# Patient Record
Sex: Female | Born: 1935 | Race: Black or African American | Hispanic: No | Smoking: Never smoker
Health system: Southern US, Community
[De-identification: ages and names within clinical notes are randomized; demographics above are authoritative.]

## PROBLEM LIST (undated history)

## (undated) DIAGNOSIS — H409 Unspecified glaucoma: Secondary | ICD-10-CM

## (undated) DIAGNOSIS — I639 Cerebral infarction, unspecified: Secondary | ICD-10-CM

## (undated) DIAGNOSIS — K219 Gastro-esophageal reflux disease without esophagitis: Secondary | ICD-10-CM

## (undated) DIAGNOSIS — Z86718 Personal history of other venous thrombosis and embolism: Secondary | ICD-10-CM

## (undated) DIAGNOSIS — E785 Hyperlipidemia, unspecified: Secondary | ICD-10-CM

## (undated) DIAGNOSIS — N189 Chronic kidney disease, unspecified: Secondary | ICD-10-CM

## (undated) DIAGNOSIS — G8191 Hemiplegia, unspecified affecting right dominant side: Secondary | ICD-10-CM

## (undated) DIAGNOSIS — K7689 Other specified diseases of liver: Secondary | ICD-10-CM

## (undated) DIAGNOSIS — E559 Vitamin D deficiency, unspecified: Secondary | ICD-10-CM

## (undated) DIAGNOSIS — I10 Essential (primary) hypertension: Secondary | ICD-10-CM

## (undated) DIAGNOSIS — K449 Diaphragmatic hernia without obstruction or gangrene: Secondary | ICD-10-CM

## (undated) DIAGNOSIS — I5032 Chronic diastolic (congestive) heart failure: Secondary | ICD-10-CM

## (undated) DIAGNOSIS — M1990 Unspecified osteoarthritis, unspecified site: Secondary | ICD-10-CM

## (undated) HISTORY — DX: Unspecified osteoarthritis, unspecified site: M19.90

## (undated) HISTORY — DX: Cerebral infarction, unspecified: I63.9

## (undated) HISTORY — DX: Chronic kidney disease, unspecified: N18.9

## (undated) HISTORY — DX: Hemiplegia, unspecified affecting right dominant side: G81.91

## (undated) HISTORY — DX: Other specified diseases of liver: K76.89

## (undated) HISTORY — DX: Vitamin D deficiency, unspecified: E55.9

## (undated) HISTORY — DX: Essential (primary) hypertension: I10

## (undated) HISTORY — DX: Personal history of other venous thrombosis and embolism: Z86.718

## (undated) HISTORY — DX: Chronic diastolic (congestive) heart failure: I50.32

## (undated) HISTORY — DX: Hyperlipidemia, unspecified: E78.5

## (undated) HISTORY — DX: Gastro-esophageal reflux disease without esophagitis: K21.9

## (undated) HISTORY — DX: Unspecified glaucoma: H40.9

## (undated) HISTORY — DX: Diaphragmatic hernia without obstruction or gangrene: K44.9

---

## 1973-11-11 HISTORY — PX: TOTAL ABDOMINAL HYSTERECTOMY: SHX209

## 1983-11-12 HISTORY — PX: CHOLECYSTECTOMY: SHX55

## 1999-02-27 ENCOUNTER — Other Ambulatory Visit: Admission: RE | Admit: 1999-02-27 | Discharge: 1999-02-27 | Payer: Self-pay | Admitting: Obstetrics and Gynecology

## 2001-05-25 ENCOUNTER — Encounter: Payer: Self-pay | Admitting: Interventional Cardiology

## 2001-05-25 ENCOUNTER — Ambulatory Visit (HOSPITAL_COMMUNITY): Admission: RE | Admit: 2001-05-25 | Discharge: 2001-05-25 | Payer: Self-pay | Admitting: Interventional Cardiology

## 2002-07-20 ENCOUNTER — Encounter: Admission: RE | Admit: 2002-07-20 | Discharge: 2002-07-20 | Payer: Self-pay | Admitting: Family Medicine

## 2002-07-20 ENCOUNTER — Encounter: Payer: Self-pay | Admitting: Family Medicine

## 2002-08-03 ENCOUNTER — Encounter: Payer: Self-pay | Admitting: Family Medicine

## 2002-08-03 ENCOUNTER — Encounter: Admission: RE | Admit: 2002-08-03 | Discharge: 2002-08-03 | Payer: Self-pay | Admitting: Family Medicine

## 2002-08-17 ENCOUNTER — Encounter: Payer: Self-pay | Admitting: Family Medicine

## 2002-08-17 ENCOUNTER — Encounter: Admission: RE | Admit: 2002-08-17 | Discharge: 2002-08-17 | Payer: Self-pay | Admitting: Family Medicine

## 2003-03-30 ENCOUNTER — Other Ambulatory Visit: Admission: RE | Admit: 2003-03-30 | Discharge: 2003-03-30 | Payer: Self-pay | Admitting: Obstetrics and Gynecology

## 2004-11-11 DIAGNOSIS — I639 Cerebral infarction, unspecified: Secondary | ICD-10-CM

## 2004-11-11 HISTORY — PX: LAPAROSCOPIC LYSIS OF ADHESIONS: SHX5905

## 2004-11-11 HISTORY — DX: Cerebral infarction, unspecified: I63.9

## 2004-11-13 ENCOUNTER — Ambulatory Visit: Payer: Self-pay | Admitting: Cardiology

## 2004-11-15 ENCOUNTER — Inpatient Hospital Stay (HOSPITAL_COMMUNITY)
Admission: RE | Admit: 2004-11-15 | Discharge: 2004-12-11 | Payer: Self-pay | Admitting: Physical Medicine & Rehabilitation

## 2004-11-15 ENCOUNTER — Ambulatory Visit: Payer: Self-pay | Admitting: Physical Medicine & Rehabilitation

## 2005-01-14 ENCOUNTER — Encounter
Admission: RE | Admit: 2005-01-14 | Discharge: 2005-04-14 | Payer: Self-pay | Admitting: Physical Medicine & Rehabilitation

## 2005-01-16 ENCOUNTER — Ambulatory Visit: Payer: Self-pay | Admitting: Physical Medicine & Rehabilitation

## 2005-03-13 ENCOUNTER — Ambulatory Visit: Payer: Self-pay | Admitting: Physical Medicine & Rehabilitation

## 2005-06-12 ENCOUNTER — Encounter
Admission: RE | Admit: 2005-06-12 | Discharge: 2005-09-10 | Payer: Self-pay | Admitting: Physical Medicine & Rehabilitation

## 2005-06-12 ENCOUNTER — Ambulatory Visit: Payer: Self-pay | Admitting: Physical Medicine & Rehabilitation

## 2005-12-09 ENCOUNTER — Ambulatory Visit: Payer: Self-pay | Admitting: Physical Medicine & Rehabilitation

## 2005-12-09 ENCOUNTER — Encounter
Admission: RE | Admit: 2005-12-09 | Discharge: 2006-03-09 | Payer: Self-pay | Admitting: Physical Medicine & Rehabilitation

## 2006-05-30 ENCOUNTER — Encounter
Admission: RE | Admit: 2006-05-30 | Discharge: 2006-08-28 | Payer: Self-pay | Admitting: Physical Medicine & Rehabilitation

## 2006-05-30 ENCOUNTER — Ambulatory Visit: Payer: Self-pay | Admitting: Physical Medicine & Rehabilitation

## 2007-02-18 ENCOUNTER — Ambulatory Visit: Payer: Self-pay | Admitting: Physical Medicine & Rehabilitation

## 2007-02-18 ENCOUNTER — Encounter
Admission: RE | Admit: 2007-02-18 | Discharge: 2007-05-19 | Payer: Self-pay | Admitting: Physical Medicine & Rehabilitation

## 2007-05-29 ENCOUNTER — Encounter
Admission: RE | Admit: 2007-05-29 | Discharge: 2007-06-01 | Payer: Self-pay | Admitting: Physical Medicine & Rehabilitation

## 2007-05-29 ENCOUNTER — Ambulatory Visit: Payer: Self-pay | Admitting: Physical Medicine & Rehabilitation

## 2007-06-16 ENCOUNTER — Encounter
Admission: RE | Admit: 2007-06-16 | Discharge: 2007-08-10 | Payer: Self-pay | Admitting: Physical Medicine & Rehabilitation

## 2007-11-17 ENCOUNTER — Encounter
Admission: RE | Admit: 2007-11-17 | Discharge: 2008-01-25 | Payer: Self-pay | Admitting: Physical Medicine & Rehabilitation

## 2007-11-17 ENCOUNTER — Ambulatory Visit: Payer: Self-pay | Admitting: Physical Medicine & Rehabilitation

## 2009-11-11 HISTORY — PX: CATARACT EXTRACTION: SUR2

## 2011-03-26 NOTE — Assessment & Plan Note (Signed)
PRIMARY CARE PHYSICIAN:  Doreen Beam, M.D.   Jaclyn Love is back regarding her left brain stroke, hemiparesis and apraxia.  She has actually had a quiet 6 months.  She has not had any new  problems.  She states that her blood pressure is under fair control.  She has been doing some walking and moving around the house.  She  received her new double-up  right AFO over the summer and seems to like that a bit better, although  it is larger.  She denies pain today.   REVIEW OF SYSTEMS:  Pertinent positives  listed above  and full review  is in written health and history section.   SOCIAL HISTORY:  The patient is widowed, living alone with her daughter.   PHYSICAL EXAMINATION:  VITAL SIGNS:  Blood pressure is 162/85, pulse 68,  respiratory rate 18.  She is satting 99% on room air.  GENERAL:  The patient is pleasant, alert and oriented x3.  She needed  assistance to move from a sit to stand position, but walking was  improved with better clearance and less hyperextension of the right  knee.  She was generally stable, except occasionally with changes in  direction.  She took extra time to walk, but her form was good.  Ankle  dorsiflexion is still 0 to 5.  She had 1 to 1+ plantar flexion at the  ankle.  Knee extension was 3+ to 4/5.  She is 3+ to 4 at the hip, as  well.  Upper extremity strength is 4 to 4+/5, except for the shoulder  which was 3-.  She has some capsulitis and decreased strains at the  right shoulder today.  There is a quarter-inch subluxation of the  humeral head noted inferiorly.  HEART:  Regular rate.  CHEST:  Clear.  ABDOMEN:  Soft, nontender.  MENTAL STATUS:  Affect is generally appropriate, although she still  remains a bit flat.   ASSESSMENT:  1. 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  2. Hypertension.  3. History of small bowel obstruction.  4. History of right third toe wound.   PLAN:  1. She was urged to continue to exercise and range of motion in  the      home, particularly for the right shoulder.  She is moving her right      upper extremity with less apraxia and is much more spontaneous      overall.  2. Hypertension through PCP.  3. Kidneys and anemia, follow up through oncology and nephrology.  4. I will see the patient back as needed in the future.      Ranelle Oyster, M.D.  Electronically Signed     ZTS/MedQ  D:  11/18/2007 10:40:28  T:  11/18/2007 11:12:54  Job #:  [id number]   cc:   Doreen Beam, MD  Fax: [phone]

## 2011-03-26 NOTE — Assessment & Plan Note (Signed)
Jaclyn Love is back regarding her left brain stroke with hemiparesis and  apraxia. Unfortunately, Jaclyn Love husband passed away about a month ago  from an unexpected heart attack. Family and she have been working  through their grief. She seems to feel she has been doing a bit better  over the last week or so. The patient denies pain today. Mood has been  depressed. Sleep has been good. She received her new double upright AFO  from [facility] and is doing well with this. The patient's skin has been  an issue, however, on the third toe of the right foot, and she has been  wearing padding in their to prevent. She notes that the issue has been  prominent she has had the new shoe.   On review of systems, full review is in the written health and history  section. Pertinent positives are listed above.   SOCIAL HISTORY:  The patient is widowed and living alone currently. The  family is supportive.   PHYSICAL EXAMINATION:  Blood pressure is 148/58, pulse 77, respiratory  rate 18. She is saturating 100% on room air. The patient is a bit flat  but pleasant. I helped her transfer from a sit to stand position from a  low-sitting chair. She needed min assist for this. Once she was walking,  she walked independently with her quad cane. However, she did have some  problems with right toe clearance and hyperextension at the right knee.  It was clearly stable, however. She needed extra time for change in  direction. I examined the right toe, and she had mild breakdown on the  medial portion of the third toe which seemed to be healing. She had  cotton swab in there for padding which seemed to be doing a fair job of  protecting the skin. Ankle dorsi flexion is 0. Plantar flexion remains  1/5. She had 2+ reflexes on the right. Minimal clonus was seen on the  right ankle. The patient had 3+ to 4/5 strength still at the right knee.  Right upper extremity strength was 4 to 4+/5 with apraxia still.  HEART:   Regular.  CHEST:  Clear.  ABDOMEN:  Soft, nontender.   ASSESSMENT:  1. 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  2. Hypertension.  3. History of small-bowel obstruction.  4. History of right third toe breakdown.   PLAN:  1. The patient will follow up with orthotist for stretching of toe box      in the right shoe. She is also getting another pair of shoes fitted      for the metal uprights. Continue with padding for now in between      the toes. She may be able to obtain an orthotic to protect the toe      itself.  2. Will send patient for constraint-induced therapies of the right      upper extremity to see if she would be a candidate. She may not      meet criteria due to balance issues. Maybe she can quality for      modified problem.  3. Hypertension through PCP.  4. I will see the patient back in six months' time.      Ranelle Oyster, M.D.  Electronically Signed     ZTS/MedQ  D:  06/01/2007 10:46:40  T:  06/01/2007 11:53:17  Job #:  [id number]

## 2011-03-29 NOTE — Discharge Summary (Signed)
Jaclyn Love, Jaclyn Love                 ACCOUNT NO.:  [id number]   MEDICAL RECORD NO.:  [id number]          PATIENT TYPE:  IPS   LOCATION:  4003                         FACILITY:  [facility]   PHYSICIAN:  Ranelle Oyster, M.D.DATE OF BIRTH:  Jul 30, 1931   DATE OF ADMISSION:  11/15/2004  DATE OF DISCHARGE:  12/11/2004                                 DISCHARGE SUMMARY   DISCHARGE DIAGNOSES:  1.  Left cerebrovascular accident with right hemiplegia.  2.  Subcutaneous Lovenox for deep vein thrombosis prophylaxis.  3.  Uncontrolled hypertension.  4.  Hyperlipidemia.  5.  Depression.  6.  Gastroesophageal reflux disease.  7.  Chronic renal insufficiency.   HISTORY OF PRESENT ILLNESS:  This is a 77 year old right-handed black female  with history of hypertension who was admitted to [facility] January 2  with right-sided weakness.  MRI showed a left deep parietal area in the  thalamus and periventricular region.  There was also subacute infarction in  the same area.  Carotid duplex negative.   HOSPITAL COURSE:  Placed on aspirin, Plavix therapy.  Speech therapy  followup, maintained on a regular diet.  Blood pressure continued to have  variables and Clonidine was added to her regimen.  Denied any bowel or  bladder disturbances.  She was admitted for comprehensive rehab program.   PAST MEDICAL HISTORY:  Hypertension, depression, chronic renal insufficiency  with creatinine 1.5, cholecystectomy, appendectomy, hysterectomy, hiatal  hernia.  Denies alcohol or tobacco.   ALLERGIES:  None.   SOCIAL HISTORY:  Lives with husband in [place], one-level home with no steps to  entry.  Husband can assist on discharge.  Local family works. She denies  tobacco or alcohol.   MEDICATIONS PRIOR TO ADMISSION:  1.  Premarin 0.9 mg daily.  2.  Eye drops.  3.  Trandate 400 mg twice daily.  4.  Zantac twice daily.  5.  Wellbutrin 150 mg twice daily.  6.  Norvasc 10 mg daily.  7.  Clonidine 0.1 mg  daily.  8.  Lasix 40 mg daily.   HOSPITAL COURSE:  The patient with progressive gains while on rehab services  with therapies initiated on a b.i.d. basis.  The following issues are  followed during the patient's rehab course.  Pertaining to Jaclyn Love left  cerebrovascular accident with right-sided weakness she was maintained on  aspirin, Plavix therapy for cerebrovascular accident prophylaxis.  She was  minimum to moderate assist for activities of daily living of bathing and  dressing, moderate to max assist for ambulation 25 feet, supervision for  propelling her wheelchair followed by speech therapy felt to be 80%  comprehension.  She exhibited no unsafe behaviors.  She was maintained on a  regular diet tolerating well.  Continued with subcutaneous Lovenox for deep  vein thrombosis prophylaxis.  Her calves remained cool without any swelling,  erythema, nontender.  During her rehab course, she had extended elevated  blood pressure readings of systolic 181, 195, 208.  Initially upon her  admission to rehab services, she was only on her Clonidine 0.1 mg three  times daily.  This was increased to 0.2 on January 6.  Norvasc added at 10  mg daily on January 7.  Clonidine established with parameters.  Lasix added  at 40 mg daily on January 8.  She continued to have elevated pressures with  Cozaar added 50 mg daily on January 9.  Consultations were obtained to both  cardiology and nephrology services for uncontrolled hypertension.  Her Lasix  was discontinued on January 12.  Addition of Aldactone 25 mg daily and  Cozaar increased to 100 mg daily.  Renal ultrasound as well as angiograms  were completed to rule out renal artery stenosis, both being negative.  As  her regimen continued to be changed as of January 30, Micardis 80 mg daily  was added.  Norvasc changed to 10 mg at bedtime.  She continued on her Coreg  25 mg twice daily.  Clonidine was now at 0.3 mg twice daily.  Lasix 40 mg  twice  daily with monitoring of renal function with baseline creatinine of  1.5.  This had increased to a max of 2.5 and monitored with last being 127  at 2.0 and stable.  Minoxidil was added at 5 mg twice daily on December 07, 2004.  Her blood pressures did show a steady improvement; systolics 118,  136; diastolics 62, 78.  It was felt if she could be held at systolic 160,  diastolic of 80 this would be with good control.  Family teaching was  ongoing during her rehab stay.  She was fitted with a right AFO brace during  her rehab course.  Day passes went well.  Plan is to be discharged to home  on December 11, 2004 with home therapies as advised.  As of January 19,  hemoglobin 10.9, hematocrit 33.9, platelet 195.  As of January 27, sodium  138, potassium 3.6, BUN 32, creatinine 2.0, and stable.   DISCHARGE MEDICATIONS:  1.  Micardis 80 mg p.o. q.h.s.  2.  Norvasc 10 mg p.o. q.h.s.  3.  Multivitamin one p.o. daily.  4.  Aspirin 325 mg p.o. daily.  5.  Plavix 75 mg p.o. daily.  6.  Premarin 0.9 mg daily was on hold.  7.  Zantac twice daily.  8.  Wellbutrin 150 mg p.o. b.i.d.  9.  Zocor 40 mg p.o. daily.  10. Xalatan 0.005% one drop both eyes twice daily.  11. Trusopt 2% one drop both eyes twice daily.  12. Timoptic one drop 0.5% both eyes twice daily.  13. Coreg 25 mg p.o. b.i.d.  14. Clonidine 0.3 mg p.o. b.i.d.  15. Lasix 40 mg p.o. b.i.d.  16. Minoxidil 5 mg p.o. b.i.d.  17. Potassium chloride 20 mEq twice daily.  18. Tylenol as needed.   Activity as tolerated with AFO brace to right lower extremity.   DIET:  Regular.   SPECIAL INSTRUCTIONS:  Home health, physical and occupational therapy.  The  patient should follow up with Dr. Faith Rogue at the outpatient rehab  service office as advised.  Dr. Eliott Nine [phone] nephrology services; for any  needed assistance concerning blood pressure; Dr. Verdis Prime, cardiology services.  Dr. Sherril Croon, [place], [place] [phone]; medical  management.      DA/MEDQ  D:  12/10/2004  T:  12/10/2004  Job:  [id number]   cc:   Duke Salvia. Eliott Nine, M.D.  [address]  [place]  [place] [address]  Fax: [phone]   Lyn Records III, M.D.  [address]  [place]  [place] [address]  Fax: [phone]   Doreen Beam  [address]  [place]  [place] [address]  Fax: [phone]

## 2011-03-29 NOTE — Consult Note (Signed)
NAMEDOVEY, FATZINGER                 ACCOUNT NO.:  [id number]   MEDICAL RECORD NO.:  [id number]          PATIENT TYPE:  IPS   LOCATION:  4003                         FACILITY:  [facility]   PHYSICIAN:  Aram Beecham B. Eliott Nine, M.D.DATE OF BIRTH:  04-24-1931   DATE OF CONSULTATION:  12/04/2004  DATE OF DISCHARGE:                                   CONSULTATION   We are asked to see this patient because of difficult to control  hypertension.  She is a 77 year old black female patient who has been on the  rehab service since early January secondary to a left brain right body  stroke. She has had long standing hypertension difficult to control as an  outpatient with average blood pressures in Dr. Michaelle Copas office that range  between 160 and 170/80 previously on a combination of Norvasc, Lasix,  Trandate and reserpine to achieve these levels.  Since admission on the  rehab service, she has been on a variety of medication combinations most  recently Lasix 40 mg a day, Norvasc 10 mg a day, minoxidil 5 mg a day,  clonidine 0.3 mg b.i.d. and Coreg 25 mg b.i.d. with a blood pressure earlier  today measured at 195/99.  The range of blood pressures over the last four  days has been anywhere from 132/79 to 125/99.   She has had a negative workup for causes of secondary hypertension including  a CO2 gadolinium angiogram on November 29, 2004 which was negative for renal  artery stenosis or for branch vessel disease. VMA 5HIAA and catecholamine  excretion in urine have all been normal.   Her serum creatinine on admission was 1.6.  Between the 12th and the 20th of  this month, it ranged between 1.5 and 1.8 and for unclear reasons bumped to  2.5 on the 21st and 22nd of January.  Cozaar was held at that time.  It has  returned to 2.1 today.   Renal ultrasound showed normal size kidneys with no evidence for  hydronephrosis.   Urinalysis has been entirely negative including for protein and cells.   PAST MEDICAL  HISTORY:  1.  Hypertension greater than 20 years duration according to the patient.  2.  History of left brain right body stroke.  3.  Dyslipidemia.  4.  History of depression.  5.  Angioedema secondary to Vasotec.  6.  Gout.   INPATIENT MEDICATIONS:  1.  Aspirin.  2.  Multivitamins.  3.  Plavix 75 mg a day.  4.  Coreg 25 mg b.i.d.  5.  Premarin 0.625 mg a day.  6.  Pepcid 20 mg b.i.d.  7.  Wellbutrin 150 mg b.i.d.  8.  Zocor 40 mg q.d.  9.  Xalatan eye drops b.i.d.  10. Trusopt drops b.i.d.  11. Timoptic eye drops.  12. Norvasc 10 mg a day.  13. K-Dur 20 mEq a day.  14. Cozaar has been on hold since the 21st of January.  15. Lovenox every 24 hours.  16. Lasix 40 mg a day.  17. Docusate twice a day.  18. Catapres 0.3 mg b.i.d.  19. Minoxidil 5 mg q.d.  20. Numerous p.r.n. medicines.   FAMILY HISTORY:  Positive for hypertension.   SOCIAL HISTORY:  The patient lives in [place] with her husband. She does not  use alcohol or tobacco.   REVIEW OF SYMPTOMS:  Positive for diffuse right sided weakness/paresis.  She  denies headache, chest pain, shortness of breath, palpitations, swelling,  nausea, vomiting, abdominal pain or diarrhea.  She is having pain in her  left great toe secondary to gout.   PHYSICAL EXAMINATION:  GENERAL:  She is a very pleasant black female in no  acute distress.  VITAL SIGNS:  Sitting blood pressure in the right arm with a regular cuff  was 140/82.  NECK:  She has no JVD, no carotid bruits.  LUNGS:  Clear.  HEART:  There is a positive S4 but normal S1, S2.  ABDOMEN:  Soft, bowel sounds are normally active. There is no abdominal  tenderness and there are no bruits.  EXTREMITIES:  She has no edema of the lower extremities. There are no  stigmata of atheroemboli.  She has a right hemiparesis and her left great  toe is erythematous and tender.   LABORATORY DATA:  Today, sodium 134, potassium 3.9, chloride 108, CO2 20,  BUN 29, creatinine 2.1, glucose  103.  VMA metanephrine's and 5HIAA and 24  hour urine collections are all within normal limits.  Urinalysis is  negative for protein or cells. CO2 angiogram negative for renal artery  stenosis or branch vessel disease.  Renal ultrasound 11 plus cm kidneys  bilaterally without hydronephrosis.   IMPRESSION:  A 77 year old black female with longstanding severe  hypertension who is on multiple medications who has exhibited more  difficulty in control and more lability during this hospital admission.  We  need to measure orthostatic's to get a better idea if this is truly labile  or if she just has some orthostasis.  Her present sitting blood pressure is  actually reasonable compared to other readings.  Since her Coreg and  Catapres were just changed yesterday will not make changes yet but will ask  for orthostatic determinations.   I am not clear on the etiology for the bump in her creatinine.  It is very  difficult for me to attribute this to impaired autoregulation associated  with controlling her hypertension since her blood pressure has not been  changed dramatically. She has no evidence for hydronephrosis or obstruction  and has a totally negative urinalysis.  Will simply watch for now although I  would not be surprised to see a bump if we do achieve better blood pressure  control.   PLAN:  Will not make any changes in medications yet and will check  orthostatic's to evaluate the effects of the most recent medication changes.  Would keep Lasix at 40 mg a day for now as she has no edema. Further  recommendations pending serial blood pressures.      CBD/MEDQ  D:  12/04/2004  T:  12/04/2004  Job:  [id number]

## 2011-06-19 ENCOUNTER — Encounter (HOSPITAL_COMMUNITY): Payer: Medicare Other | Attending: Nephrology

## 2011-06-19 DIAGNOSIS — D509 Iron deficiency anemia, unspecified: Secondary | ICD-10-CM | POA: Insufficient documentation

## 2015-11-12 DIAGNOSIS — K7689 Other specified diseases of liver: Secondary | ICD-10-CM

## 2015-11-12 HISTORY — DX: Other specified diseases of liver: K76.89

## 2015-12-05 DIAGNOSIS — I82409 Acute embolism and thrombosis of unspecified deep veins of unspecified lower extremity: Secondary | ICD-10-CM | POA: Diagnosis not present

## 2015-12-25 DIAGNOSIS — N390 Urinary tract infection, site not specified: Secondary | ICD-10-CM | POA: Diagnosis not present

## 2016-01-04 DIAGNOSIS — I82409 Acute embolism and thrombosis of unspecified deep veins of unspecified lower extremity: Secondary | ICD-10-CM | POA: Diagnosis not present

## 2016-01-22 DIAGNOSIS — I82409 Acute embolism and thrombosis of unspecified deep veins of unspecified lower extremity: Secondary | ICD-10-CM | POA: Diagnosis not present

## 2016-01-22 DIAGNOSIS — G4700 Insomnia, unspecified: Secondary | ICD-10-CM | POA: Diagnosis not present

## 2016-02-06 DIAGNOSIS — H4720 Unspecified optic atrophy: Secondary | ICD-10-CM | POA: Diagnosis not present

## 2016-02-06 DIAGNOSIS — H401123 Primary open-angle glaucoma, left eye, severe stage: Secondary | ICD-10-CM | POA: Diagnosis not present

## 2016-02-06 DIAGNOSIS — H401112 Primary open-angle glaucoma, right eye, moderate stage: Secondary | ICD-10-CM | POA: Diagnosis not present

## 2016-02-07 DIAGNOSIS — I639 Cerebral infarction, unspecified: Secondary | ICD-10-CM | POA: Diagnosis not present

## 2016-02-07 DIAGNOSIS — E7800 Pure hypercholesterolemia, unspecified: Secondary | ICD-10-CM | POA: Diagnosis not present

## 2016-02-07 DIAGNOSIS — I10 Essential (primary) hypertension: Secondary | ICD-10-CM | POA: Diagnosis not present

## 2016-02-09 DIAGNOSIS — Z6825 Body mass index (BMI) 25.0-25.9, adult: Secondary | ICD-10-CM | POA: Diagnosis not present

## 2016-02-09 DIAGNOSIS — Z0000 Encounter for general adult medical examination without abnormal findings: Secondary | ICD-10-CM | POA: Diagnosis not present

## 2016-02-09 DIAGNOSIS — E559 Vitamin D deficiency, unspecified: Secondary | ICD-10-CM | POA: Diagnosis not present

## 2016-02-09 DIAGNOSIS — Z1211 Encounter for screening for malignant neoplasm of colon: Secondary | ICD-10-CM | POA: Diagnosis not present

## 2016-02-09 DIAGNOSIS — Z299 Encounter for prophylactic measures, unspecified: Secondary | ICD-10-CM | POA: Diagnosis not present

## 2016-02-09 DIAGNOSIS — Z7189 Other specified counseling: Secondary | ICD-10-CM | POA: Diagnosis not present

## 2016-02-09 DIAGNOSIS — Z79899 Other long term (current) drug therapy: Secondary | ICD-10-CM | POA: Diagnosis not present

## 2016-02-09 DIAGNOSIS — M2550 Pain in unspecified joint: Secondary | ICD-10-CM | POA: Diagnosis not present

## 2016-02-09 DIAGNOSIS — E039 Hypothyroidism, unspecified: Secondary | ICD-10-CM | POA: Diagnosis not present

## 2016-02-09 DIAGNOSIS — Z1389 Encounter for screening for other disorder: Secondary | ICD-10-CM | POA: Diagnosis not present

## 2016-02-19 DIAGNOSIS — I82409 Acute embolism and thrombosis of unspecified deep veins of unspecified lower extremity: Secondary | ICD-10-CM | POA: Diagnosis not present

## 2016-02-21 DIAGNOSIS — Z862 Personal history of diseases of the blood and blood-forming organs and certain disorders involving the immune mechanism: Secondary | ICD-10-CM | POA: Diagnosis not present

## 2016-02-21 DIAGNOSIS — N2581 Secondary hyperparathyroidism of renal origin: Secondary | ICD-10-CM | POA: Diagnosis not present

## 2016-02-21 DIAGNOSIS — N183 Chronic kidney disease, stage 3 (moderate): Secondary | ICD-10-CM | POA: Diagnosis not present

## 2016-02-21 DIAGNOSIS — Z8639 Personal history of other endocrine, nutritional and metabolic disease: Secondary | ICD-10-CM | POA: Diagnosis not present

## 2016-02-21 DIAGNOSIS — I10 Essential (primary) hypertension: Secondary | ICD-10-CM | POA: Diagnosis not present

## 2016-02-28 DIAGNOSIS — M79672 Pain in left foot: Secondary | ICD-10-CM | POA: Diagnosis not present

## 2016-02-28 DIAGNOSIS — I739 Peripheral vascular disease, unspecified: Secondary | ICD-10-CM | POA: Diagnosis not present

## 2016-02-28 DIAGNOSIS — B351 Tinea unguium: Secondary | ICD-10-CM | POA: Diagnosis not present

## 2016-02-28 DIAGNOSIS — M79671 Pain in right foot: Secondary | ICD-10-CM | POA: Diagnosis not present

## 2016-03-15 DIAGNOSIS — I639 Cerebral infarction, unspecified: Secondary | ICD-10-CM | POA: Diagnosis not present

## 2016-03-15 DIAGNOSIS — I10 Essential (primary) hypertension: Secondary | ICD-10-CM | POA: Diagnosis not present

## 2016-03-15 DIAGNOSIS — E7800 Pure hypercholesterolemia, unspecified: Secondary | ICD-10-CM | POA: Diagnosis not present

## 2016-03-18 DIAGNOSIS — I82409 Acute embolism and thrombosis of unspecified deep veins of unspecified lower extremity: Secondary | ICD-10-CM | POA: Diagnosis not present

## 2016-03-18 DIAGNOSIS — M1990 Unspecified osteoarthritis, unspecified site: Secondary | ICD-10-CM | POA: Diagnosis not present

## 2016-04-09 DIAGNOSIS — M542 Cervicalgia: Secondary | ICD-10-CM | POA: Diagnosis not present

## 2016-04-09 DIAGNOSIS — I10 Essential (primary) hypertension: Secondary | ICD-10-CM | POA: Diagnosis not present

## 2016-04-09 DIAGNOSIS — Z789 Other specified health status: Secondary | ICD-10-CM | POA: Diagnosis not present

## 2016-04-09 DIAGNOSIS — I82409 Acute embolism and thrombosis of unspecified deep veins of unspecified lower extremity: Secondary | ICD-10-CM | POA: Diagnosis not present

## 2016-04-09 DIAGNOSIS — M1710 Unilateral primary osteoarthritis, unspecified knee: Secondary | ICD-10-CM | POA: Diagnosis not present

## 2016-04-15 DIAGNOSIS — B356 Tinea cruris: Secondary | ICD-10-CM | POA: Diagnosis not present

## 2016-04-15 DIAGNOSIS — I82409 Acute embolism and thrombosis of unspecified deep veins of unspecified lower extremity: Secondary | ICD-10-CM | POA: Diagnosis not present

## 2016-04-18 DIAGNOSIS — I639 Cerebral infarction, unspecified: Secondary | ICD-10-CM | POA: Diagnosis not present

## 2016-04-18 DIAGNOSIS — E7800 Pure hypercholesterolemia, unspecified: Secondary | ICD-10-CM | POA: Diagnosis not present

## 2016-04-18 DIAGNOSIS — I10 Essential (primary) hypertension: Secondary | ICD-10-CM | POA: Diagnosis not present

## 2016-05-21 DIAGNOSIS — I82409 Acute embolism and thrombosis of unspecified deep veins of unspecified lower extremity: Secondary | ICD-10-CM | POA: Diagnosis not present

## 2016-05-21 DIAGNOSIS — Z299 Encounter for prophylactic measures, unspecified: Secondary | ICD-10-CM | POA: Diagnosis not present

## 2016-05-27 DIAGNOSIS — E7800 Pure hypercholesterolemia, unspecified: Secondary | ICD-10-CM | POA: Diagnosis not present

## 2016-05-27 DIAGNOSIS — I10 Essential (primary) hypertension: Secondary | ICD-10-CM | POA: Diagnosis not present

## 2016-05-27 DIAGNOSIS — I639 Cerebral infarction, unspecified: Secondary | ICD-10-CM | POA: Diagnosis not present

## 2016-05-30 DIAGNOSIS — B351 Tinea unguium: Secondary | ICD-10-CM | POA: Diagnosis not present

## 2016-05-30 DIAGNOSIS — I739 Peripheral vascular disease, unspecified: Secondary | ICD-10-CM | POA: Diagnosis not present

## 2016-05-30 DIAGNOSIS — M79672 Pain in left foot: Secondary | ICD-10-CM | POA: Diagnosis not present

## 2016-05-30 DIAGNOSIS — M79671 Pain in right foot: Secondary | ICD-10-CM | POA: Diagnosis not present

## 2016-06-09 DIAGNOSIS — R42 Dizziness and giddiness: Secondary | ICD-10-CM | POA: Diagnosis not present

## 2016-06-09 DIAGNOSIS — Z888 Allergy status to other drugs, medicaments and biological substances status: Secondary | ICD-10-CM | POA: Diagnosis not present

## 2016-06-09 DIAGNOSIS — R531 Weakness: Secondary | ICD-10-CM | POA: Diagnosis not present

## 2016-06-09 DIAGNOSIS — I69351 Hemiplegia and hemiparesis following cerebral infarction affecting right dominant side: Secondary | ICD-10-CM | POA: Diagnosis not present

## 2016-06-09 DIAGNOSIS — G839 Paralytic syndrome, unspecified: Secondary | ICD-10-CM | POA: Diagnosis not present

## 2016-06-09 DIAGNOSIS — R51 Headache: Secondary | ICD-10-CM | POA: Diagnosis not present

## 2016-06-09 DIAGNOSIS — Z91013 Allergy to seafood: Secondary | ICD-10-CM | POA: Diagnosis not present

## 2016-06-09 DIAGNOSIS — Z9109 Other allergy status, other than to drugs and biological substances: Secondary | ICD-10-CM | POA: Diagnosis not present

## 2016-06-09 DIAGNOSIS — Z79899 Other long term (current) drug therapy: Secondary | ICD-10-CM | POA: Diagnosis not present

## 2016-06-11 DIAGNOSIS — I10 Essential (primary) hypertension: Secondary | ICD-10-CM | POA: Diagnosis not present

## 2016-06-11 DIAGNOSIS — Z09 Encounter for follow-up examination after completed treatment for conditions other than malignant neoplasm: Secondary | ICD-10-CM | POA: Diagnosis not present

## 2016-06-11 DIAGNOSIS — Z299 Encounter for prophylactic measures, unspecified: Secondary | ICD-10-CM | POA: Diagnosis not present

## 2016-06-11 DIAGNOSIS — I639 Cerebral infarction, unspecified: Secondary | ICD-10-CM | POA: Diagnosis not present

## 2016-06-18 DIAGNOSIS — E7800 Pure hypercholesterolemia, unspecified: Secondary | ICD-10-CM | POA: Diagnosis not present

## 2016-06-18 DIAGNOSIS — I639 Cerebral infarction, unspecified: Secondary | ICD-10-CM | POA: Diagnosis not present

## 2016-06-18 DIAGNOSIS — I10 Essential (primary) hypertension: Secondary | ICD-10-CM | POA: Diagnosis not present

## 2016-06-19 DIAGNOSIS — I82409 Acute embolism and thrombosis of unspecified deep veins of unspecified lower extremity: Secondary | ICD-10-CM | POA: Diagnosis not present

## 2016-06-19 DIAGNOSIS — I10 Essential (primary) hypertension: Secondary | ICD-10-CM | POA: Diagnosis not present

## 2016-06-26 DIAGNOSIS — I10 Essential (primary) hypertension: Secondary | ICD-10-CM | POA: Diagnosis not present

## 2016-06-26 DIAGNOSIS — R600 Localized edema: Secondary | ICD-10-CM | POA: Diagnosis not present

## 2016-07-07 DIAGNOSIS — Z8673 Personal history of transient ischemic attack (TIA), and cerebral infarction without residual deficits: Secondary | ICD-10-CM | POA: Diagnosis not present

## 2016-07-07 DIAGNOSIS — R339 Retention of urine, unspecified: Secondary | ICD-10-CM | POA: Diagnosis not present

## 2016-07-07 DIAGNOSIS — I10 Essential (primary) hypertension: Secondary | ICD-10-CM | POA: Diagnosis not present

## 2016-07-07 DIAGNOSIS — Z86718 Personal history of other venous thrombosis and embolism: Secondary | ICD-10-CM | POA: Diagnosis not present

## 2016-07-07 DIAGNOSIS — Z79899 Other long term (current) drug therapy: Secondary | ICD-10-CM | POA: Diagnosis not present

## 2016-07-07 DIAGNOSIS — N390 Urinary tract infection, site not specified: Secondary | ICD-10-CM | POA: Diagnosis not present

## 2016-07-07 DIAGNOSIS — H409 Unspecified glaucoma: Secondary | ICD-10-CM | POA: Diagnosis not present

## 2016-07-07 DIAGNOSIS — E7800 Pure hypercholesterolemia, unspecified: Secondary | ICD-10-CM | POA: Diagnosis not present

## 2016-07-24 DIAGNOSIS — Z23 Encounter for immunization: Secondary | ICD-10-CM | POA: Diagnosis not present

## 2016-07-24 DIAGNOSIS — I82409 Acute embolism and thrombosis of unspecified deep veins of unspecified lower extremity: Secondary | ICD-10-CM | POA: Diagnosis not present

## 2016-08-02 DIAGNOSIS — E860 Dehydration: Secondary | ICD-10-CM | POA: Diagnosis not present

## 2016-08-02 DIAGNOSIS — N390 Urinary tract infection, site not specified: Secondary | ICD-10-CM | POA: Diagnosis not present

## 2016-08-02 DIAGNOSIS — N289 Disorder of kidney and ureter, unspecified: Secondary | ICD-10-CM | POA: Diagnosis not present

## 2016-08-02 DIAGNOSIS — R350 Frequency of micturition: Secondary | ICD-10-CM | POA: Diagnosis not present

## 2016-08-02 DIAGNOSIS — I69351 Hemiplegia and hemiparesis following cerebral infarction affecting right dominant side: Secondary | ICD-10-CM | POA: Diagnosis not present

## 2016-08-02 DIAGNOSIS — K7689 Other specified diseases of liver: Secondary | ICD-10-CM | POA: Diagnosis not present

## 2016-08-02 DIAGNOSIS — E7800 Pure hypercholesterolemia, unspecified: Secondary | ICD-10-CM | POA: Diagnosis not present

## 2016-08-02 DIAGNOSIS — Z86718 Personal history of other venous thrombosis and embolism: Secondary | ICD-10-CM | POA: Diagnosis not present

## 2016-08-03 DIAGNOSIS — Z86718 Personal history of other venous thrombosis and embolism: Secondary | ICD-10-CM | POA: Diagnosis not present

## 2016-08-03 DIAGNOSIS — Z888 Allergy status to other drugs, medicaments and biological substances status: Secondary | ICD-10-CM | POA: Diagnosis not present

## 2016-08-03 DIAGNOSIS — I10 Essential (primary) hypertension: Secondary | ICD-10-CM | POA: Diagnosis present

## 2016-08-03 DIAGNOSIS — H409 Unspecified glaucoma: Secondary | ICD-10-CM | POA: Diagnosis present

## 2016-08-03 DIAGNOSIS — E7800 Pure hypercholesterolemia, unspecified: Secondary | ICD-10-CM | POA: Diagnosis present

## 2016-08-03 DIAGNOSIS — Z79899 Other long term (current) drug therapy: Secondary | ICD-10-CM | POA: Diagnosis not present

## 2016-08-03 DIAGNOSIS — Z91013 Allergy to seafood: Secondary | ICD-10-CM | POA: Diagnosis not present

## 2016-08-03 DIAGNOSIS — N390 Urinary tract infection, site not specified: Secondary | ICD-10-CM | POA: Diagnosis present

## 2016-08-03 DIAGNOSIS — N289 Disorder of kidney and ureter, unspecified: Secondary | ICD-10-CM | POA: Diagnosis present

## 2016-08-03 DIAGNOSIS — Z7901 Long term (current) use of anticoagulants: Secondary | ICD-10-CM | POA: Diagnosis not present

## 2016-08-03 DIAGNOSIS — I69351 Hemiplegia and hemiparesis following cerebral infarction affecting right dominant side: Secondary | ICD-10-CM | POA: Diagnosis not present

## 2016-08-03 DIAGNOSIS — E860 Dehydration: Secondary | ICD-10-CM | POA: Diagnosis present

## 2016-08-04 DIAGNOSIS — E860 Dehydration: Secondary | ICD-10-CM | POA: Diagnosis not present

## 2016-08-04 DIAGNOSIS — N289 Disorder of kidney and ureter, unspecified: Secondary | ICD-10-CM | POA: Diagnosis not present

## 2016-08-04 DIAGNOSIS — I69351 Hemiplegia and hemiparesis following cerebral infarction affecting right dominant side: Secondary | ICD-10-CM | POA: Diagnosis not present

## 2016-08-04 DIAGNOSIS — N390 Urinary tract infection, site not specified: Secondary | ICD-10-CM | POA: Diagnosis not present

## 2016-08-05 DIAGNOSIS — E860 Dehydration: Secondary | ICD-10-CM | POA: Diagnosis not present

## 2016-08-05 DIAGNOSIS — N390 Urinary tract infection, site not specified: Secondary | ICD-10-CM | POA: Diagnosis not present

## 2016-08-05 DIAGNOSIS — N289 Disorder of kidney and ureter, unspecified: Secondary | ICD-10-CM | POA: Diagnosis not present

## 2016-08-05 DIAGNOSIS — I69351 Hemiplegia and hemiparesis following cerebral infarction affecting right dominant side: Secondary | ICD-10-CM | POA: Diagnosis not present

## 2016-08-06 DIAGNOSIS — Z09 Encounter for follow-up examination after completed treatment for conditions other than malignant neoplasm: Secondary | ICD-10-CM | POA: Diagnosis not present

## 2016-08-06 DIAGNOSIS — I10 Essential (primary) hypertension: Secondary | ICD-10-CM | POA: Diagnosis not present

## 2016-08-06 DIAGNOSIS — M2550 Pain in unspecified joint: Secondary | ICD-10-CM | POA: Diagnosis not present

## 2016-08-06 DIAGNOSIS — N390 Urinary tract infection, site not specified: Secondary | ICD-10-CM | POA: Diagnosis not present

## 2016-08-08 DIAGNOSIS — D649 Anemia, unspecified: Secondary | ICD-10-CM | POA: Diagnosis not present

## 2016-08-08 DIAGNOSIS — I10 Essential (primary) hypertension: Secondary | ICD-10-CM | POA: Diagnosis not present

## 2016-08-08 DIAGNOSIS — Z86718 Personal history of other venous thrombosis and embolism: Secondary | ICD-10-CM | POA: Diagnosis not present

## 2016-08-08 DIAGNOSIS — E785 Hyperlipidemia, unspecified: Secondary | ICD-10-CM | POA: Diagnosis not present

## 2016-08-08 DIAGNOSIS — N3000 Acute cystitis without hematuria: Secondary | ICD-10-CM | POA: Diagnosis not present

## 2016-08-08 DIAGNOSIS — H409 Unspecified glaucoma: Secondary | ICD-10-CM | POA: Diagnosis not present

## 2016-08-08 DIAGNOSIS — Z7901 Long term (current) use of anticoagulants: Secondary | ICD-10-CM | POA: Diagnosis not present

## 2016-08-08 DIAGNOSIS — I69351 Hemiplegia and hemiparesis following cerebral infarction affecting right dominant side: Secondary | ICD-10-CM | POA: Diagnosis not present

## 2016-08-09 DIAGNOSIS — I69351 Hemiplegia and hemiparesis following cerebral infarction affecting right dominant side: Secondary | ICD-10-CM | POA: Diagnosis not present

## 2016-08-09 DIAGNOSIS — D649 Anemia, unspecified: Secondary | ICD-10-CM | POA: Diagnosis not present

## 2016-08-09 DIAGNOSIS — N3000 Acute cystitis without hematuria: Secondary | ICD-10-CM | POA: Diagnosis not present

## 2016-08-09 DIAGNOSIS — I10 Essential (primary) hypertension: Secondary | ICD-10-CM | POA: Diagnosis not present

## 2016-08-09 DIAGNOSIS — H409 Unspecified glaucoma: Secondary | ICD-10-CM | POA: Diagnosis not present

## 2016-08-09 DIAGNOSIS — E785 Hyperlipidemia, unspecified: Secondary | ICD-10-CM | POA: Diagnosis not present

## 2016-08-10 DIAGNOSIS — I10 Essential (primary) hypertension: Secondary | ICD-10-CM | POA: Diagnosis not present

## 2016-08-10 DIAGNOSIS — I69351 Hemiplegia and hemiparesis following cerebral infarction affecting right dominant side: Secondary | ICD-10-CM | POA: Diagnosis not present

## 2016-08-10 DIAGNOSIS — D649 Anemia, unspecified: Secondary | ICD-10-CM | POA: Diagnosis not present

## 2016-08-10 DIAGNOSIS — H409 Unspecified glaucoma: Secondary | ICD-10-CM | POA: Diagnosis not present

## 2016-08-10 DIAGNOSIS — N3000 Acute cystitis without hematuria: Secondary | ICD-10-CM | POA: Diagnosis not present

## 2016-08-10 DIAGNOSIS — E785 Hyperlipidemia, unspecified: Secondary | ICD-10-CM | POA: Diagnosis not present

## 2016-08-12 DIAGNOSIS — D649 Anemia, unspecified: Secondary | ICD-10-CM | POA: Diagnosis not present

## 2016-08-12 DIAGNOSIS — E785 Hyperlipidemia, unspecified: Secondary | ICD-10-CM | POA: Diagnosis not present

## 2016-08-12 DIAGNOSIS — I69351 Hemiplegia and hemiparesis following cerebral infarction affecting right dominant side: Secondary | ICD-10-CM | POA: Diagnosis not present

## 2016-08-12 DIAGNOSIS — I10 Essential (primary) hypertension: Secondary | ICD-10-CM | POA: Diagnosis not present

## 2016-08-12 DIAGNOSIS — N3000 Acute cystitis without hematuria: Secondary | ICD-10-CM | POA: Diagnosis not present

## 2016-08-12 DIAGNOSIS — H409 Unspecified glaucoma: Secondary | ICD-10-CM | POA: Diagnosis not present

## 2016-08-13 DIAGNOSIS — N3000 Acute cystitis without hematuria: Secondary | ICD-10-CM | POA: Diagnosis not present

## 2016-08-13 DIAGNOSIS — I10 Essential (primary) hypertension: Secondary | ICD-10-CM | POA: Diagnosis not present

## 2016-08-13 DIAGNOSIS — H409 Unspecified glaucoma: Secondary | ICD-10-CM | POA: Diagnosis not present

## 2016-08-13 DIAGNOSIS — I69351 Hemiplegia and hemiparesis following cerebral infarction affecting right dominant side: Secondary | ICD-10-CM | POA: Diagnosis not present

## 2016-08-13 DIAGNOSIS — E785 Hyperlipidemia, unspecified: Secondary | ICD-10-CM | POA: Diagnosis not present

## 2016-08-13 DIAGNOSIS — N390 Urinary tract infection, site not specified: Secondary | ICD-10-CM | POA: Diagnosis not present

## 2016-08-13 DIAGNOSIS — D649 Anemia, unspecified: Secondary | ICD-10-CM | POA: Diagnosis not present

## 2016-08-14 DIAGNOSIS — I10 Essential (primary) hypertension: Secondary | ICD-10-CM | POA: Diagnosis not present

## 2016-08-14 DIAGNOSIS — D649 Anemia, unspecified: Secondary | ICD-10-CM | POA: Diagnosis not present

## 2016-08-14 DIAGNOSIS — E785 Hyperlipidemia, unspecified: Secondary | ICD-10-CM | POA: Diagnosis not present

## 2016-08-14 DIAGNOSIS — H409 Unspecified glaucoma: Secondary | ICD-10-CM | POA: Diagnosis not present

## 2016-08-14 DIAGNOSIS — I69351 Hemiplegia and hemiparesis following cerebral infarction affecting right dominant side: Secondary | ICD-10-CM | POA: Diagnosis not present

## 2016-08-14 DIAGNOSIS — N3000 Acute cystitis without hematuria: Secondary | ICD-10-CM | POA: Diagnosis not present

## 2016-08-15 DIAGNOSIS — N3000 Acute cystitis without hematuria: Secondary | ICD-10-CM | POA: Diagnosis not present

## 2016-08-15 DIAGNOSIS — I69351 Hemiplegia and hemiparesis following cerebral infarction affecting right dominant side: Secondary | ICD-10-CM | POA: Diagnosis not present

## 2016-08-15 DIAGNOSIS — D649 Anemia, unspecified: Secondary | ICD-10-CM | POA: Diagnosis not present

## 2016-08-15 DIAGNOSIS — I10 Essential (primary) hypertension: Secondary | ICD-10-CM | POA: Diagnosis not present

## 2016-08-15 DIAGNOSIS — E785 Hyperlipidemia, unspecified: Secondary | ICD-10-CM | POA: Diagnosis not present

## 2016-08-15 DIAGNOSIS — H409 Unspecified glaucoma: Secondary | ICD-10-CM | POA: Diagnosis not present

## 2016-08-16 DIAGNOSIS — H409 Unspecified glaucoma: Secondary | ICD-10-CM | POA: Diagnosis not present

## 2016-08-16 DIAGNOSIS — I69351 Hemiplegia and hemiparesis following cerebral infarction affecting right dominant side: Secondary | ICD-10-CM | POA: Diagnosis not present

## 2016-08-16 DIAGNOSIS — I10 Essential (primary) hypertension: Secondary | ICD-10-CM | POA: Diagnosis not present

## 2016-08-16 DIAGNOSIS — D649 Anemia, unspecified: Secondary | ICD-10-CM | POA: Diagnosis not present

## 2016-08-16 DIAGNOSIS — N3000 Acute cystitis without hematuria: Secondary | ICD-10-CM | POA: Diagnosis not present

## 2016-08-16 DIAGNOSIS — E785 Hyperlipidemia, unspecified: Secondary | ICD-10-CM | POA: Diagnosis not present

## 2016-08-19 DIAGNOSIS — I10 Essential (primary) hypertension: Secondary | ICD-10-CM | POA: Diagnosis not present

## 2016-08-19 DIAGNOSIS — I69351 Hemiplegia and hemiparesis following cerebral infarction affecting right dominant side: Secondary | ICD-10-CM | POA: Diagnosis not present

## 2016-08-19 DIAGNOSIS — N179 Acute kidney failure, unspecified: Secondary | ICD-10-CM | POA: Diagnosis not present

## 2016-08-19 DIAGNOSIS — N390 Urinary tract infection, site not specified: Secondary | ICD-10-CM | POA: Diagnosis not present

## 2016-08-19 DIAGNOSIS — D649 Anemia, unspecified: Secondary | ICD-10-CM | POA: Diagnosis not present

## 2016-08-19 DIAGNOSIS — D500 Iron deficiency anemia secondary to blood loss (chronic): Secondary | ICD-10-CM | POA: Diagnosis not present

## 2016-08-19 DIAGNOSIS — E785 Hyperlipidemia, unspecified: Secondary | ICD-10-CM | POA: Diagnosis not present

## 2016-08-19 DIAGNOSIS — K922 Gastrointestinal hemorrhage, unspecified: Secondary | ICD-10-CM | POA: Diagnosis not present

## 2016-08-19 DIAGNOSIS — I129 Hypertensive chronic kidney disease with stage 1 through stage 4 chronic kidney disease, or unspecified chronic kidney disease: Secondary | ICD-10-CM | POA: Diagnosis not present

## 2016-08-19 DIAGNOSIS — I82599 Chronic embolism and thrombosis of other specified deep vein of unspecified lower extremity: Secondary | ICD-10-CM | POA: Diagnosis not present

## 2016-08-19 DIAGNOSIS — H409 Unspecified glaucoma: Secondary | ICD-10-CM | POA: Diagnosis not present

## 2016-08-19 DIAGNOSIS — L03115 Cellulitis of right lower limb: Secondary | ICD-10-CM | POA: Diagnosis not present

## 2016-08-19 DIAGNOSIS — N3000 Acute cystitis without hematuria: Secondary | ICD-10-CM | POA: Diagnosis not present

## 2016-08-20 DIAGNOSIS — D500 Iron deficiency anemia secondary to blood loss (chronic): Secondary | ICD-10-CM | POA: Diagnosis present

## 2016-08-20 DIAGNOSIS — N179 Acute kidney failure, unspecified: Secondary | ICD-10-CM | POA: Diagnosis present

## 2016-08-20 DIAGNOSIS — K922 Gastrointestinal hemorrhage, unspecified: Secondary | ICD-10-CM | POA: Diagnosis present

## 2016-08-20 DIAGNOSIS — D62 Acute posthemorrhagic anemia: Secondary | ICD-10-CM | POA: Diagnosis not present

## 2016-08-20 DIAGNOSIS — N200 Calculus of kidney: Secondary | ICD-10-CM | POA: Diagnosis not present

## 2016-08-20 DIAGNOSIS — Z466 Encounter for fitting and adjustment of urinary device: Secondary | ICD-10-CM | POA: Diagnosis not present

## 2016-08-20 DIAGNOSIS — I82599 Chronic embolism and thrombosis of other specified deep vein of unspecified lower extremity: Secondary | ICD-10-CM | POA: Diagnosis present

## 2016-08-20 DIAGNOSIS — Z79899 Other long term (current) drug therapy: Secondary | ICD-10-CM | POA: Diagnosis not present

## 2016-08-20 DIAGNOSIS — N390 Urinary tract infection, site not specified: Secondary | ICD-10-CM | POA: Diagnosis present

## 2016-08-20 DIAGNOSIS — M6281 Muscle weakness (generalized): Secondary | ICD-10-CM | POA: Diagnosis not present

## 2016-08-20 DIAGNOSIS — Z888 Allergy status to other drugs, medicaments and biological substances status: Secondary | ICD-10-CM | POA: Diagnosis not present

## 2016-08-20 DIAGNOSIS — M2450 Contracture, unspecified joint: Secondary | ICD-10-CM | POA: Diagnosis present

## 2016-08-20 DIAGNOSIS — K7689 Other specified diseases of liver: Secondary | ICD-10-CM | POA: Diagnosis not present

## 2016-08-20 DIAGNOSIS — N189 Chronic kidney disease, unspecified: Secondary | ICD-10-CM | POA: Diagnosis present

## 2016-08-20 DIAGNOSIS — E8809 Other disorders of plasma-protein metabolism, not elsewhere classified: Secondary | ICD-10-CM | POA: Diagnosis present

## 2016-08-20 DIAGNOSIS — R279 Unspecified lack of coordination: Secondary | ICD-10-CM | POA: Diagnosis not present

## 2016-08-20 DIAGNOSIS — Z86718 Personal history of other venous thrombosis and embolism: Secondary | ICD-10-CM | POA: Diagnosis not present

## 2016-08-20 DIAGNOSIS — I69998 Other sequelae following unspecified cerebrovascular disease: Secondary | ICD-10-CM | POA: Diagnosis not present

## 2016-08-20 DIAGNOSIS — N183 Chronic kidney disease, stage 3 (moderate): Secondary | ICD-10-CM | POA: Diagnosis not present

## 2016-08-20 DIAGNOSIS — I10 Essential (primary) hypertension: Secondary | ICD-10-CM | POA: Diagnosis present

## 2016-08-20 DIAGNOSIS — R339 Retention of urine, unspecified: Secondary | ICD-10-CM | POA: Diagnosis present

## 2016-08-20 DIAGNOSIS — Z7901 Long term (current) use of anticoagulants: Secondary | ICD-10-CM | POA: Diagnosis not present

## 2016-08-20 DIAGNOSIS — I129 Hypertensive chronic kidney disease with stage 1 through stage 4 chronic kidney disease, or unspecified chronic kidney disease: Secondary | ICD-10-CM | POA: Diagnosis present

## 2016-08-20 DIAGNOSIS — D631 Anemia in chronic kidney disease: Secondary | ICD-10-CM | POA: Diagnosis present

## 2016-08-20 DIAGNOSIS — M7989 Other specified soft tissue disorders: Secondary | ICD-10-CM | POA: Diagnosis not present

## 2016-08-20 DIAGNOSIS — Z7401 Bed confinement status: Secondary | ICD-10-CM | POA: Diagnosis not present

## 2016-08-20 DIAGNOSIS — Z8673 Personal history of transient ischemic attack (TIA), and cerebral infarction without residual deficits: Secondary | ICD-10-CM | POA: Diagnosis not present

## 2016-08-20 DIAGNOSIS — Z9049 Acquired absence of other specified parts of digestive tract: Secondary | ICD-10-CM | POA: Diagnosis not present

## 2016-08-23 DIAGNOSIS — D649 Anemia, unspecified: Secondary | ICD-10-CM | POA: Diagnosis not present

## 2016-08-23 DIAGNOSIS — N390 Urinary tract infection, site not specified: Secondary | ICD-10-CM | POA: Diagnosis not present

## 2016-08-23 DIAGNOSIS — Z7401 Bed confinement status: Secondary | ICD-10-CM | POA: Diagnosis not present

## 2016-08-23 DIAGNOSIS — R600 Localized edema: Secondary | ICD-10-CM | POA: Diagnosis not present

## 2016-08-23 DIAGNOSIS — N179 Acute kidney failure, unspecified: Secondary | ICD-10-CM | POA: Diagnosis not present

## 2016-08-23 DIAGNOSIS — M1990 Unspecified osteoarthritis, unspecified site: Secondary | ICD-10-CM | POA: Diagnosis not present

## 2016-08-23 DIAGNOSIS — Z9049 Acquired absence of other specified parts of digestive tract: Secondary | ICD-10-CM | POA: Diagnosis not present

## 2016-08-23 DIAGNOSIS — N2581 Secondary hyperparathyroidism of renal origin: Secondary | ICD-10-CM | POA: Diagnosis not present

## 2016-08-23 DIAGNOSIS — Z86718 Personal history of other venous thrombosis and embolism: Secondary | ICD-10-CM | POA: Diagnosis not present

## 2016-08-23 DIAGNOSIS — R279 Unspecified lack of coordination: Secondary | ICD-10-CM | POA: Diagnosis not present

## 2016-08-23 DIAGNOSIS — Z8673 Personal history of transient ischemic attack (TIA), and cerebral infarction without residual deficits: Secondary | ICD-10-CM | POA: Diagnosis not present

## 2016-08-23 DIAGNOSIS — Z862 Personal history of diseases of the blood and blood-forming organs and certain disorders involving the immune mechanism: Secondary | ICD-10-CM | POA: Diagnosis not present

## 2016-08-23 DIAGNOSIS — K922 Gastrointestinal hemorrhage, unspecified: Secondary | ICD-10-CM | POA: Diagnosis not present

## 2016-08-23 DIAGNOSIS — I82599 Chronic embolism and thrombosis of other specified deep vein of unspecified lower extremity: Secondary | ICD-10-CM | POA: Diagnosis not present

## 2016-08-23 DIAGNOSIS — N183 Chronic kidney disease, stage 3 (moderate): Secondary | ICD-10-CM | POA: Diagnosis not present

## 2016-08-23 DIAGNOSIS — Z466 Encounter for fitting and adjustment of urinary device: Secondary | ICD-10-CM | POA: Diagnosis not present

## 2016-08-23 DIAGNOSIS — M6281 Muscle weakness (generalized): Secondary | ICD-10-CM | POA: Diagnosis not present

## 2016-08-23 DIAGNOSIS — Z299 Encounter for prophylactic measures, unspecified: Secondary | ICD-10-CM | POA: Diagnosis not present

## 2016-08-23 DIAGNOSIS — Z8639 Personal history of other endocrine, nutritional and metabolic disease: Secondary | ICD-10-CM | POA: Diagnosis not present

## 2016-08-23 DIAGNOSIS — B961 Klebsiella pneumoniae [K. pneumoniae] as the cause of diseases classified elsewhere: Secondary | ICD-10-CM | POA: Diagnosis not present

## 2016-08-23 DIAGNOSIS — I129 Hypertensive chronic kidney disease with stage 1 through stage 4 chronic kidney disease, or unspecified chronic kidney disease: Secondary | ICD-10-CM | POA: Diagnosis not present

## 2016-08-23 DIAGNOSIS — K7689 Other specified diseases of liver: Secondary | ICD-10-CM | POA: Diagnosis not present

## 2016-08-23 DIAGNOSIS — N200 Calculus of kidney: Secondary | ICD-10-CM | POA: Diagnosis not present

## 2016-08-23 DIAGNOSIS — R339 Retention of urine, unspecified: Secondary | ICD-10-CM | POA: Diagnosis not present

## 2016-08-23 DIAGNOSIS — D62 Acute posthemorrhagic anemia: Secondary | ICD-10-CM | POA: Diagnosis not present

## 2016-08-23 DIAGNOSIS — Z6825 Body mass index (BMI) 25.0-25.9, adult: Secondary | ICD-10-CM | POA: Diagnosis not present

## 2016-08-23 DIAGNOSIS — D500 Iron deficiency anemia secondary to blood loss (chronic): Secondary | ICD-10-CM | POA: Diagnosis not present

## 2016-08-23 DIAGNOSIS — E785 Hyperlipidemia, unspecified: Secondary | ICD-10-CM | POA: Diagnosis not present

## 2016-08-23 DIAGNOSIS — I10 Essential (primary) hypertension: Secondary | ICD-10-CM | POA: Diagnosis not present

## 2016-08-26 ENCOUNTER — Other Ambulatory Visit: Payer: Self-pay | Admitting: *Deleted

## 2016-08-26 MED ORDER — SIMVASTATIN 20 MG PO TABS: 20.0000 mg | ORAL_TABLET | Freq: Every day | ORAL | 3 refills | Status: DC

## 2016-08-26 MED ORDER — BUPROPION HCL ER (XL) 150 MG PO TB24: 150.0000 mg | ORAL_TABLET | Freq: Every day | ORAL | 0 refills | Status: DC

## 2016-08-26 MED ORDER — DOCUSATE SODIUM 100 MG PO CAPS: 100.0000 mg | ORAL_CAPSULE | Freq: Two times a day (BID) | ORAL | 0 refills | Status: DC

## 2016-08-26 MED ORDER — CALCIUM 500 MG PO TABS: ORAL_TABLET | ORAL | 0 refills | Status: DC

## 2016-08-27 ENCOUNTER — Encounter: Payer: Self-pay | Admitting: Internal Medicine

## 2016-08-27 ENCOUNTER — Non-Acute Institutional Stay (SKILLED_NURSING_FACILITY): Payer: Medicare Other | Admitting: Internal Medicine

## 2016-08-27 DIAGNOSIS — R609 Edema, unspecified: Secondary | ICD-10-CM | POA: Insufficient documentation

## 2016-08-27 DIAGNOSIS — D62 Acute posthemorrhagic anemia: Secondary | ICD-10-CM | POA: Diagnosis not present

## 2016-08-27 DIAGNOSIS — N200 Calculus of kidney: Secondary | ICD-10-CM | POA: Diagnosis not present

## 2016-08-27 DIAGNOSIS — N189 Chronic kidney disease, unspecified: Secondary | ICD-10-CM | POA: Insufficient documentation

## 2016-08-27 DIAGNOSIS — N183 Chronic kidney disease, stage 3 unspecified: Secondary | ICD-10-CM

## 2016-08-27 DIAGNOSIS — I10 Essential (primary) hypertension: Secondary | ICD-10-CM | POA: Insufficient documentation

## 2016-08-27 DIAGNOSIS — Z86718 Personal history of other venous thrombosis and embolism: Secondary | ICD-10-CM | POA: Diagnosis not present

## 2016-08-27 DIAGNOSIS — R339 Retention of urine, unspecified: Secondary | ICD-10-CM | POA: Diagnosis not present

## 2016-08-27 DIAGNOSIS — R600 Localized edema: Secondary | ICD-10-CM | POA: Diagnosis not present

## 2016-08-27 DIAGNOSIS — N289 Disorder of kidney and ureter, unspecified: Secondary | ICD-10-CM | POA: Insufficient documentation

## 2016-08-27 DIAGNOSIS — Z8673 Personal history of transient ischemic attack (TIA), and cerebral infarction without residual deficits: Secondary | ICD-10-CM

## 2016-08-27 LAB — PROTIME-INR: PROTIME: 24.6 s — AB (ref 10.0–13.8)

## 2016-08-27 LAB — POCT INR: INR: 2.4 — AB (ref 0.9–1.1)

## 2016-08-27 NOTE — Patient Instructions (Signed)
Foley catheter will remain in place until she has a follow-up evaluation by urology. Due to cost Aggrenox will be changed to generic Plavix 75 mg daily. This was discussed with her. According to the medical literature this should be non-inferior for stroke prevention. PT/INR will be monitored

## 2016-08-27 NOTE — Progress Notes (Signed)
This is a comprehensive admission note to [facility] performed on this date less than 30 days from date of admission. Included are preadmission medical/surgical history;reconciled medication list; family history; social history and comprehensive review of systems.  Corrections and additions to the records were documented . Comprehensive physical exam was also performed. Additionally a clinical summary was entered for each active diagnosis pertinent to this admission in the Problem List to enhance continuity of care.  PCP: Dr. Iona Coach, [facility]  HPI:The patient was hospitalized 10/10-10/13/17 with extreme weakness and fatigue in the context of marked anemia. Hemoglobin was 7.7. Coumadin was held because of elevated PT/INR 3.5 and subjective history of dark urine suggesting hematuria. Creatinine was 2.71, above her usual degree of chronic renal insufficiency. At discharge creatinine was 1.34 and BUN 25.  Because of symptomatic anemia she was transfused with 2 units of packed cells with a resultant hemoglobin of 8.4. She received a third unit of packed cells. At discharge hemoglobin was 9.6 Significantly she is on warfarin as well as Aggrenox. She's had a prior history of stroke. There was asymmetric edema in the right leg, venous Dopplers were negative for DVT. Low albumin with a value of 2.4 was suggested as a possible component. Foley catheter was placed for urinary retention. She had residual volume of 3 L, she was seen by urology. Foley catheter was to be left in with outpatient follow-up with urology. Cystometric and urodynamic evaluations were planned. The patient did have symptomatic improvement but remained weak and fatigued, she was discharged to SNF for rehabilitation. Blood pressure was poorly controlled and clonidine was increased. By history she has a allergy or intolerance to the  clonidine patch glue. She's had no adverse reaction to the oral formulation. At the time of  discharge echocardiogram results were pending   Past medical and surgical history: Includes hypertension, dyslipidemia,  deep venous thrombosis, history of stroke, chronic renal insufficiency, and chronic diastolic heart failure. She's had a hysterectomy and cholecystectomy.  Social history: Updated  Family history: Updated  Review of systems: Her only active complaint is swelling of the dorsum of the left hand related to extravasation of IV fluids. Subjectively this is improving. She has chronic weakness in the right lower extremity. She has no active GI or GU symptoms at this time;but she had some dysuria prior to admission. She also had had urinary tract infections on 2 occasions in the 6 weeks prior to admission. She has no bleeding dyscrasias despite being on Aggrenox and warfarin. Constitutional: No fever,significant weight change, fatigue  Eyes: No redness, discharge, pain, vision change ENT/mouth: No nasal congestion,  purulent discharge, earache,change in hearing ,sore throat  Cardiovascular: No chest pain, palpitations  Respiratory: No cough, sputum production,hemoptysis, DOE , significant snoring,apnea   Gastrointestinal: No heartburn,dysphagia,abdominal pain, nausea / vomiting,rectal bleeding, melena,change in bowels Genitourinary: No dysuria,hematuria, pyuria,  incontinence, nocturia Musculoskeletal: No joint stiffness, joint swelling, pain Dermatologic: No rash, pruritus, change in appearance of skin Neurologic: No dizziness,headache,syncope, seizures, numbness , tingling Psychiatric: No significant anxiety , depression, insomnia, anorexia Endocrine: No change in hair/skin/ nails, excessive thirst, excessive hunger, excessive urination  Hematologic/lymphatic: No significant bruising, lymphadenopathy,abnormal bleeding Allergy/immunology: No itchy/ watery eyes, significant sneezing, urticaria, angioedema  Physical exam:  Pertinent or positive findings: There is slight facial  asymmetry with slight drooping of the right jaw area. Her voice is somewhat hoarse. Upper plate present. She has contracture of the right upper extremity with decreased range of motion. Brace is  present over the right lower extremity. Right lower extremity edema is present despite wearing TED hose. Pedal pulses are decreased. There is subcutaneous edema over the dorsum of the left hand.  General appearance:Adequately nourished; no acute distress , increased work of breathing is present.   Lymphatic: No lymphadenopathy about the head, neck, axilla .Eyes: No conjunctival inflammation or lid edema is present. There is no scleral icterus. Ears:  External ear exam shows no significant lesions or deformities.   Nose:  External nasal examination shows no deformity or inflammation. Nasal mucosa are pink and moist without lesions ,exudates Oral exam: lips and gums are healthy appearing.There is no oropharyngeal erythema or exudate . Neck:  No thyromegaly, masses, tenderness noted.    Heart:  Normal rate and regular rhythm. S1 and S2 normal without gallop, murmur, click, rub .  Lungs:Chest clear to auscultation without wheezes, rhonchi,rales , rubs. Abdomen:Bowel sounds are normal. Abdomen is soft and nontender with no organomegaly, hernias,masses. GU: deferred as previously addressed. Extremities:  No cyanosis, clubbing  Neurologic exam : Balance,Rhomberg,finger to nose testing could not be completed due to clinical state Skin: Warm & dry w/o tenting. No significant lesions or rash.  See clinical summary under each active problem in the Problem List with associated updated therapeutic plan

## 2016-08-31 LAB — CBC AND DIFFERENTIAL
HCT: 27 % — AB (ref 36–46)
Hemoglobin: 9.1 g/dL — AB (ref 12.0–16.0)
Neutrophils Absolute: 2596 /uL
Platelets: 127 K/µL — AB (ref 150–399)
WBC: 4.4 10*3/mL

## 2016-08-31 LAB — BASIC METABOLIC PANEL WITH GFR
BUN: 18 mg/dL (ref 4–21)
Creatinine: 1.3 mg/dL — AB (ref 0.5–1.1)
Glucose: 85 mg/dL
Potassium: 3.8 mmol/L (ref 3.4–5.3)
Sodium: 138 mmol/L (ref 137–147)

## 2016-09-02 LAB — POCT INR: INR: 1.6 — AB (ref 0.9–1.1)

## 2016-09-02 LAB — PROTIME-INR: Protime: 16.4 seconds — AB (ref 10.0–13.8)

## 2016-09-06 ENCOUNTER — Non-Acute Institutional Stay (SKILLED_NURSING_FACILITY): Payer: Medicare Other | Admitting: Nurse Practitioner

## 2016-09-06 ENCOUNTER — Encounter: Payer: Self-pay | Admitting: Nurse Practitioner

## 2016-09-06 DIAGNOSIS — B961 Klebsiella pneumoniae [K. pneumoniae] as the cause of diseases classified elsewhere: Secondary | ICD-10-CM | POA: Diagnosis not present

## 2016-09-06 DIAGNOSIS — R339 Retention of urine, unspecified: Secondary | ICD-10-CM | POA: Diagnosis not present

## 2016-09-06 DIAGNOSIS — E785 Hyperlipidemia, unspecified: Secondary | ICD-10-CM

## 2016-09-06 DIAGNOSIS — R600 Localized edema: Secondary | ICD-10-CM | POA: Diagnosis not present

## 2016-09-06 DIAGNOSIS — M1990 Unspecified osteoarthritis, unspecified site: Secondary | ICD-10-CM | POA: Diagnosis not present

## 2016-09-06 DIAGNOSIS — Z86718 Personal history of other venous thrombosis and embolism: Secondary | ICD-10-CM | POA: Diagnosis not present

## 2016-09-06 DIAGNOSIS — N183 Chronic kidney disease, stage 3 unspecified: Secondary | ICD-10-CM

## 2016-09-06 DIAGNOSIS — N2581 Secondary hyperparathyroidism of renal origin: Secondary | ICD-10-CM | POA: Diagnosis not present

## 2016-09-06 DIAGNOSIS — Z8639 Personal history of other endocrine, nutritional and metabolic disease: Secondary | ICD-10-CM | POA: Diagnosis not present

## 2016-09-06 DIAGNOSIS — I10 Essential (primary) hypertension: Secondary | ICD-10-CM | POA: Diagnosis not present

## 2016-09-06 DIAGNOSIS — D649 Anemia, unspecified: Secondary | ICD-10-CM | POA: Diagnosis not present

## 2016-09-06 DIAGNOSIS — D62 Acute posthemorrhagic anemia: Secondary | ICD-10-CM

## 2016-09-06 DIAGNOSIS — Z8673 Personal history of transient ischemic attack (TIA), and cerebral infarction without residual deficits: Secondary | ICD-10-CM

## 2016-09-06 DIAGNOSIS — Z6825 Body mass index (BMI) 25.0-25.9, adult: Secondary | ICD-10-CM | POA: Diagnosis not present

## 2016-09-06 DIAGNOSIS — Z299 Encounter for prophylactic measures, unspecified: Secondary | ICD-10-CM | POA: Diagnosis not present

## 2016-09-06 DIAGNOSIS — N179 Acute kidney failure, unspecified: Secondary | ICD-10-CM | POA: Diagnosis not present

## 2016-09-06 DIAGNOSIS — N390 Urinary tract infection, site not specified: Secondary | ICD-10-CM | POA: Diagnosis not present

## 2016-09-06 MED ORDER — CLOPIDOGREL BISULFATE 75 MG PO TABS: 75.0000 mg | ORAL_TABLET | Freq: Every day | ORAL | 0 refills | Status: DC

## 2016-09-06 MED ORDER — MINOXIDIL 2.5 MG PO TABS: 2.5000 mg | ORAL_TABLET | Freq: Every day | ORAL | 0 refills | Status: AC

## 2016-09-06 MED ORDER — FUROSEMIDE 40 MG PO TABS: 40.0000 mg | ORAL_TABLET | Freq: Every day | ORAL | 0 refills | Status: AC

## 2016-09-06 MED ORDER — DORZOLAMIDE HCL-TIMOLOL MAL 2-0.5 % OP SOLN: 1.0000 [drp] | Freq: Every day | OPHTHALMIC | 0 refills | Status: AC

## 2016-09-06 MED ORDER — CLONIDINE HCL 0.2 MG PO TABS: 0.2000 mg | ORAL_TABLET | Freq: Two times a day (BID) | ORAL | 0 refills | Status: AC

## 2016-09-06 MED ORDER — FERROUS SULFATE 325 (65 FE) MG PO TABS: 325.0000 mg | ORAL_TABLET | Freq: Every day | ORAL | 0 refills | Status: AC

## 2016-09-06 MED ORDER — SIMVASTATIN 20 MG PO TABS: 20.0000 mg | ORAL_TABLET | Freq: Every day | ORAL | 0 refills | Status: AC

## 2016-09-06 MED ORDER — WARFARIN SODIUM 5 MG PO TABS: 5.0000 mg | ORAL_TABLET | Freq: Every day | ORAL | 0 refills | Status: AC

## 2016-09-06 MED ORDER — LATANOPROST 0.005 % OP SOLN: 1.0000 [drp] | Freq: Every day | OPHTHALMIC | 0 refills | Status: AC

## 2016-09-06 MED ORDER — CARVEDILOL 12.5 MG PO TABS: 12.5000 mg | ORAL_TABLET | Freq: Two times a day (BID) | ORAL | 0 refills | Status: AC

## 2016-09-06 MED ORDER — BUPROPION HCL ER (XL) 150 MG PO TB24: 150.0000 mg | ORAL_TABLET | Freq: Every day | ORAL | 0 refills | Status: AC

## 2016-09-06 MED ORDER — CALCIUM 500 MG PO TABS: ORAL_TABLET | ORAL | 0 refills | Status: AC

## 2016-09-06 NOTE — Progress Notes (Signed)
Location:  [facility] Room Number: 223 Place of Service:  SNF (31)  Provider: No primary care provider on file.  PCP: No primary care provider on file. No care team member to display  Extended Emergency Contact Information Primary Emergency Contact: Rayburn Ma [place] Home Phone: [phone] Relation: Sister   Goals of care:  Advanced Directive information Advanced Directives 09/06/2016  Does patient have an advance directive? No     Allergies  Allergen Reactions  . Ace Inhibitors   . Fish Allergy Swelling  . Phenergan [Promethazine Hcl]   . Xanax [Alprazolam]     Mood tolerance  . Clonidine Derivatives Rash    Rash attributed to glue on the clonidine patch  08/27/16 the patient is on oral Catapres without issue    Chief Complaint  Patient presents with  . Discharge Note    HPI:  Pt is a 82 y.o. female who is being seen today for discharge home at family request.   Pt with hx of hypertension, dyslipidemia,  deep venous thrombosis, history of stroke, chronic renal insufficiency, and chronic diastolic heart failure.  The patient was hospitalized 10/10-10/13/17 with extreme weakness and fatigue in the context of marked anemia. Hemoglobin was 7.7. Coumadin was held because of elevated PT/INR 3.5 and subjective history of dark urine suggesting hematuria. Pt received 2 units PRBC due to symptomatic anemia.   Foley catheter was placed for urinary retention. She had residual volume of 3 L, she was seen by urology. Voiding trail failed and foley catheter was to be left in with outpatient follow-up with urology.   Past Medical History:  Diagnosis Date  . Chronic diastolic heart failure (HCC)   . CKD (chronic kidney disease)   . CVA (cerebral vascular accident) (HCC)   . Glaucoma   . Hepatic cyst 2017   7 cm hepatic cyst  . Hiatal hernia   . History of deep vein thrombosis (DVT) of lower extremity    One episode; on  warfarin since  . Hyperlipidemia   . Hypertension     Past Surgical History:  Procedure Laterality Date  . CHOLECYSTECTOMY  1985  . LAPAROSCOPIC LYSIS OF ADHESIONS  2006  . TOTAL ABDOMINAL HYSTERECTOMY  1975      reports that she has never smoked. She has never used smokeless tobacco. She reports that she does not drink alcohol or use drugs. Social History   Social History  . Marital status: Married    Spouse name: N/A  . Number of children: N/A  . Years of education: N/A   Occupational History  . Not on file.   Social History Main Topics  . Smoking status: Never Smoker  . Smokeless tobacco: Never Used  . Alcohol use No  . Drug use: No  . Sexual activity: No   Other Topics Concern  . Not on file   Social History Narrative  . No narrative on file    Allergies  Allergen Reactions  . Ace Inhibitors   . Fish Allergy Swelling  . Phenergan [Promethazine Hcl]   . Xanax [Alprazolam]     Mood tolerance  . Clonidine Derivatives Rash    Rash attributed to glue on the clonidine patch  08/27/16 the patient is on oral Catapres without issue    Pertinent  Health Maintenance Due  Topic Date Due  . DEXA SCAN  10/16/1998  . PNA vac Low Risk Adult (1 of 2 - PCV13) 10/16/1998  . INFLUENZA  VACCINE  06/11/2016    Medications:   Medication List       Accurate as of 09/06/16 10:27 AM. Always use your most recent med list.          buPROPion 150 MG 24 hr tablet Commonly known as:  WELLBUTRIN XL Take 1 tablet (150 mg total) by mouth daily.   Calcium 500 MG tablet Take one tablet by mouth twice daily   carvedilol 12.5 MG tablet Commonly known as:  COREG Take 12.5 mg by mouth 2 (two) times daily with a meal.   cloNIDine 0.2 MG tablet Commonly known as:  CATAPRES Take 0.2 mg by mouth 2 (two) times daily.   clopidogrel 75 MG tablet Commonly known as:  PLAVIX Take 75 mg by mouth daily.   docusate sodium 100 MG capsule Commonly known as:  COLACE Take 1 capsule  (100 mg total) by mouth 2 (two) times daily.   dorzolamide-timolol 22.3-6.8 MG/ML ophthalmic solution Commonly known as:  COSOPT 1 drop daily.   ferrous sulfate 325 (65 FE) MG tablet Take 325 mg by mouth daily with breakfast.   furosemide 40 MG tablet Commonly known as:  LASIX Take 40 mg by mouth daily.   latanoprost 0.005 % ophthalmic solution Commonly known as:  XALATAN 1 drop at bedtime.   minoxidil 2.5 MG tablet Commonly known as:  LONITEN Take by mouth daily.   simvastatin 20 MG tablet Commonly known as:  ZOCOR Take 1 tablet (20 mg total) by mouth at bedtime.   warfarin 5 MG tablet Commonly known as:  COUMADIN Take 5 mg by mouth daily.       Review of Systems  Constitutional: Negative for activity change, appetite change, fatigue and unexpected weight change.  HENT: Negative for congestion and hearing loss.   Eyes: Negative.   Respiratory: Negative for cough and shortness of breath.   Cardiovascular: Positive for leg swelling (chronic edema to right lower leg). Negative for chest pain and palpitations.  Gastrointestinal: Negative for abdominal pain, constipation and diarrhea.  Genitourinary: Negative for difficulty urinating and dysuria.  Musculoskeletal: Positive for arthralgias (to knees).  Skin: Negative for color change and wound.  Neurological: Positive for weakness. Negative for dizziness.  Psychiatric/Behavioral: Positive for confusion. Negative for agitation and behavioral problems.    Vitals:   09/06/16 1000  BP: (!) 144/68  Pulse: 81  Resp: 20  Temp: 98.6 F (37 C)  SpO2: 97%  Weight: 172 lb (78 kg)  Height: 5\' 8"  (1.727 m)   Body mass index is 26.15 kg/m. Physical Exam  Constitutional: She is oriented to person, place, and time. She appears well-developed and well-nourished. No distress.  HENT:  Head: Normocephalic and atraumatic.  Mouth/Throat: Oropharynx is clear and moist. No oropharyngeal exudate.  Eyes: Conjunctivae are normal.  Pupils are equal, round, and reactive to light.  Neck: Normal range of motion. Neck supple.  Cardiovascular: Normal rate, regular rhythm and normal heart sounds.   Pulmonary/Chest: Effort normal and breath sounds normal.  Abdominal: Soft. Bowel sounds are normal.  Genitourinary:  Genitourinary Comments: Foley catheter   Musculoskeletal: She exhibits edema (+1 to right lower leg). She exhibits no tenderness.  Neurological: She is alert and oriented to person, place, and time.  Right sided hemiparesis   Skin: Skin is warm and dry. She is not diaphoretic.  Psychiatric: She has a normal mood and affect.    Labs reviewed: Basic Metabolic Panel:  Recent Labs  21/30/86  NA 138  K 3.8  BUN 18  CREATININE 1.3*   Liver Function Tests: No results for input(s): AST, ALT, ALKPHOS, BILITOT, PROT, ALBUMIN in the last 8760 hours. No results for input(s): LIPASE, AMYLASE in the last 8760 hours. No results for input(s): AMMONIA in the last 8760 hours. CBC:  Recent Labs  08/31/16  WBC 4.4  NEUTROABS 2,596  HGB 9.1*  HCT 27*  PLT 127*   Cardiac Enzymes: No results for input(s): CKTOTAL, CKMB, CKMBINDEX, TROPONINI in the last 8760 hours. BNP: Invalid input(s): POCBNP CBG: No results for input(s): GLUCAP in the last 8760 hours.  Procedures and Imaging Studies During Stay: No results found.  Assessment/Plan:   1. Essential hypertension Stable, will cont current regimen and PCP to follow up - cloNIDine (CATAPRES) 0.2 MG tablet; Take 1 tablet (0.2 mg total) by mouth 2 (two) times daily.  Dispense: 60 tablet; Refill: 0 - minoxidil (LONITEN) 2.5 MG tablet; Take 1 tablet (2.5 mg total) by mouth daily.  Dispense: 30 tablet; Refill: 0 - carvedilol (COREG) 12.5 MG tablet; Take 1 tablet (12.5 mg total) by mouth 2 (two) times daily with a meal.  Dispense: 60 tablet; Refill: 0  2. Chronic renal impairment, stage 3 (moderate) Following with nephrology, had appt today.   3. Acute blood loss  anemia -without signs of bleeding, conts on iron, will need CBC by PCP to follow up hgb.  - ferrous sulfate 325 (65 FE) MG tablet; Take 1 tablet (325 mg total) by mouth daily with breakfast.  Dispense: 30 tablet; Refill: 0  4. History of deep venous thrombosis -no signs of recurrent DVT, remains on coumadin  - warfarin (COUMADIN) 5 MG tablet; Take 1 tablet (5 mg total) by mouth daily.  Dispense: 30 tablet; Refill: 0  5. Hyperlipidemia, unspecified hyperlipidemia type - simvastatin (ZOCOR) 20 MG tablet; Take 1 tablet (20 mg total) by mouth at bedtime.  Dispense: 30 tablet; Refill: 0  6. Urinary retention Pt remains with foley catheter. Has urology follow up scheduled for ongoing evaluation   7. History of stroke Right sided weakness, following with neurology per family. conts on plavix and coumadin.  - clopidogrel (PLAVIX) 75 MG tablet; Take 1 tablet (75 mg total) by mouth daily.  Dispense: 30 tablet; Refill: 0  8. Localized edema Stable - furosemide (LASIX) 40 MG tablet; Take 1 tablet (40 mg total) by mouth daily.  Dispense: 30 tablet; Refill: 0   Patient is being discharged with the following home health services:  PT/OT/Nursing for INR and foley care  Patient is being discharged with the following durable medical equipment:  N/A Pt is leaving facility at the request of family.  Patient has been advised to f/u with their PCP in 1 week to bring them up to date on their rehab stay.  Social services at facility was responsible for arranging this appointment.  Pt was provided with a 30 day supply of prescriptions for medications and refills must be obtained from their PCP.    Future labs/tests needed:  Cbc and bmp Family reports labs were taking by nephrologist today

## 2016-09-07 ENCOUNTER — Emergency Department (HOSPITAL_BASED_OUTPATIENT_CLINIC_OR_DEPARTMENT_OTHER)
Admit: 2016-09-07 | Discharge: 2016-09-07 | Disposition: A | Discharge status: Home / Self Care | Payer: Medicare Other | Attending: Emergency Medicine | Admitting: Emergency Medicine

## 2016-09-07 ENCOUNTER — Observation Stay (HOSPITAL_COMMUNITY)
Admission: EM | Admit: 2016-09-07 | Discharge: 2016-09-08 | Disposition: A | Discharge status: Home / Self Care | Payer: Medicare Other | Attending: Family Medicine | Admitting: Family Medicine

## 2016-09-07 ENCOUNTER — Other Ambulatory Visit: Payer: Self-pay

## 2016-09-07 ENCOUNTER — Encounter (HOSPITAL_COMMUNITY): Payer: Self-pay | Admitting: Emergency Medicine

## 2016-09-07 ENCOUNTER — Observation Stay (HOSPITAL_COMMUNITY): Payer: Medicare Other

## 2016-09-07 ENCOUNTER — Emergency Department (HOSPITAL_COMMUNITY): Payer: Medicare Other

## 2016-09-07 DIAGNOSIS — Z7901 Long term (current) use of anticoagulants: Secondary | ICD-10-CM | POA: Insufficient documentation

## 2016-09-07 DIAGNOSIS — R4182 Altered mental status, unspecified: Secondary | ICD-10-CM | POA: Insufficient documentation

## 2016-09-07 DIAGNOSIS — N189 Chronic kidney disease, unspecified: Secondary | ICD-10-CM | POA: Diagnosis present

## 2016-09-07 DIAGNOSIS — M7989 Other specified soft tissue disorders: Secondary | ICD-10-CM

## 2016-09-07 DIAGNOSIS — I10 Essential (primary) hypertension: Secondary | ICD-10-CM | POA: Diagnosis present

## 2016-09-07 DIAGNOSIS — L8990 Pressure ulcer of unspecified site, unspecified stage: Secondary | ICD-10-CM | POA: Insufficient documentation

## 2016-09-07 DIAGNOSIS — T83511A Infection and inflammatory reaction due to indwelling urethral catheter, initial encounter: Secondary | ICD-10-CM | POA: Diagnosis not present

## 2016-09-07 DIAGNOSIS — N390 Urinary tract infection, site not specified: Secondary | ICD-10-CM | POA: Diagnosis present

## 2016-09-07 DIAGNOSIS — Y828 Other medical devices associated with adverse incidents: Secondary | ICD-10-CM | POA: Diagnosis not present

## 2016-09-07 DIAGNOSIS — I5032 Chronic diastolic (congestive) heart failure: Secondary | ICD-10-CM | POA: Insufficient documentation

## 2016-09-07 DIAGNOSIS — N183 Chronic kidney disease, stage 3 (moderate): Secondary | ICD-10-CM

## 2016-09-07 DIAGNOSIS — E8770 Fluid overload, unspecified: Secondary | ICD-10-CM | POA: Diagnosis present

## 2016-09-07 DIAGNOSIS — Z79899 Other long term (current) drug therapy: Secondary | ICD-10-CM | POA: Diagnosis not present

## 2016-09-07 DIAGNOSIS — R600 Localized edema: Secondary | ICD-10-CM

## 2016-09-07 DIAGNOSIS — M25532 Pain in left wrist: Secondary | ICD-10-CM | POA: Insufficient documentation

## 2016-09-07 DIAGNOSIS — I130 Hypertensive heart and chronic kidney disease with heart failure and stage 1 through stage 4 chronic kidney disease, or unspecified chronic kidney disease: Secondary | ICD-10-CM | POA: Insufficient documentation

## 2016-09-07 DIAGNOSIS — J9811 Atelectasis: Secondary | ICD-10-CM | POA: Diagnosis not present

## 2016-09-07 DIAGNOSIS — Z8673 Personal history of transient ischemic attack (TIA), and cerebral infarction without residual deficits: Secondary | ICD-10-CM

## 2016-09-07 DIAGNOSIS — M79609 Pain in unspecified limb: Secondary | ICD-10-CM

## 2016-09-07 LAB — TYPE AND SCREEN
ABO/RH(D): B POS
Antibody Screen: NEGATIVE

## 2016-09-07 LAB — COMPREHENSIVE METABOLIC PANEL
ALBUMIN: 2 g/dL — AB (ref 3.5–5.0)
ALT: 35 U/L (ref 14–54)
ANION GAP: 8 (ref 5–15)
AST: 48 U/L — ABNORMAL HIGH (ref 15–41)
Alkaline Phosphatase: 65 U/L (ref 38–126)
BILIRUBIN TOTAL: 0.5 mg/dL (ref 0.3–1.2)
BUN: 17 mg/dL (ref 6–20)
CO2: 24 mmol/L (ref 22–32)
Calcium: 8.3 mg/dL — ABNORMAL LOW (ref 8.9–10.3)
Chloride: 104 mmol/L (ref 101–111)
Creatinine, Ser: 1.42 mg/dL — ABNORMAL HIGH (ref 0.44–1.00)
GFR, EST AFRICAN AMERICAN: 39 mL/min — AB (ref >60)
GFR, EST NON AFRICAN AMERICAN: 33 mL/min — AB (ref >60)
GLUCOSE: 108 mg/dL — AB (ref 65–99)
POTASSIUM: 3.8 mmol/L (ref 3.5–5.1)
Sodium: 136 mmol/L (ref 135–145)
TOTAL PROTEIN: 6.2 g/dL — AB (ref 6.5–8.1)

## 2016-09-07 LAB — URINALYSIS, ROUTINE W REFLEX MICROSCOPIC
BILIRUBIN URINE: NEGATIVE
Glucose, UA: NEGATIVE mg/dL
Ketones, ur: NEGATIVE mg/dL
NITRITE: NEGATIVE
PH: 7 (ref 5.0–8.0)
Protein, ur: 30 mg/dL — AB
SPECIFIC GRAVITY, URINE: 1.015 (ref 1.005–1.030)

## 2016-09-07 LAB — CBC WITH DIFFERENTIAL/PLATELET
BASOS PCT: 0 %
Band Neutrophils: 0 %
Basophils Absolute: 0 10*3/uL (ref 0.0–0.1)
Blasts: 0 %
EOS PCT: 0 %
Eosinophils Absolute: 0 10*3/uL (ref 0.0–0.7)
HEMATOCRIT: 28.6 % — AB (ref 36.0–46.0)
Hemoglobin: 9.7 g/dL — ABNORMAL LOW (ref 12.0–15.0)
LYMPHS PCT: 15 %
Lymphs Abs: 1.2 10*3/uL (ref 0.7–4.0)
MCH: 30.2 pg (ref 26.0–34.0)
MCHC: 33.9 g/dL (ref 30.0–36.0)
MCV: 89.1 fL (ref 78.0–100.0)
MONO ABS: 0.8 10*3/uL (ref 0.1–1.0)
Metamyelocytes Relative: 0 %
Monocytes Relative: 10 %
Myelocytes: 0 %
NEUTROS PCT: 75 %
NRBC: 0 /100{WBCs}
Neutro Abs: 6 10*3/uL (ref 1.7–7.7)
OTHER: 0 %
PROMYELOCYTES ABS: 0 %
Platelets: 269 10*3/uL (ref 150–400)
RBC: 3.21 MIL/uL — AB (ref 3.87–5.11)
RDW: 14.2 % (ref 11.5–15.5)
WBC: 8 10*3/uL (ref 4.0–10.5)

## 2016-09-07 LAB — PROTIME-INR
INR: 1.73
PROTHROMBIN TIME: 20.5 s — AB (ref 11.4–15.2)

## 2016-09-07 LAB — POC OCCULT BLOOD, ED: Fecal Occult Bld: NEGATIVE

## 2016-09-07 LAB — URINE MICROSCOPIC-ADD ON

## 2016-09-07 LAB — I-STAT CG4 LACTIC ACID, ED: LACTIC ACID, VENOUS: 1.22 mmol/L (ref 0.5–1.9)

## 2016-09-07 LAB — ABO/RH: ABO/RH(D): B POS

## 2016-09-07 LAB — BRAIN NATRIURETIC PEPTIDE: B Natriuretic Peptide: 933 pg/mL — ABNORMAL HIGH (ref 0.0–100.0)

## 2016-09-07 LAB — TROPONIN I

## 2016-09-07 MED ORDER — BISACODYL 10 MG RE SUPP
10.0000 mg | Freq: Once | RECTAL | Status: AC
  Administered 2016-09-07: 10 mg via RECTAL
  Filled 2016-09-07: qty 1

## 2016-09-07 MED ORDER — ASPIRIN-DIPYRIDAMOLE ER 25-200 MG PO CP12
1.0000 | ORAL_CAPSULE | Freq: Every day | ORAL | Status: DC
  Administered 2016-09-07 – 2016-09-08 (×2): 1 via ORAL
  Filled 2016-09-07 (×2): qty 1

## 2016-09-07 MED ORDER — ARIPIPRAZOLE 5 MG PO TABS
2.5000 mg | ORAL_TABLET | Freq: Every day | ORAL | Status: DC
  Administered 2016-09-07 – 2016-09-08 (×2): 2.5 mg via ORAL
  Filled 2016-09-07 (×2): qty 1

## 2016-09-07 MED ORDER — LEVOFLOXACIN 750 MG PO TABS
750.0000 mg | ORAL_TABLET | ORAL | Status: DC
  Administered 2016-09-07: 750 mg via ORAL
  Filled 2016-09-07: qty 2
  Filled 2016-09-07: qty 1

## 2016-09-07 MED ORDER — ACETAMINOPHEN 650 MG RE SUPP: 650.0000 mg | Freq: Four times a day (QID) | RECTAL | Status: DC | PRN

## 2016-09-07 MED ORDER — BUPROPION HCL ER (XL) 150 MG PO TB24
150.0000 mg | ORAL_TABLET | Freq: Every day | ORAL | Status: DC
  Administered 2016-09-07 – 2016-09-08 (×2): 150 mg via ORAL
  Filled 2016-09-07 (×2): qty 1

## 2016-09-07 MED ORDER — ONDANSETRON HCL 4 MG/2ML IJ SOLN: 4.0000 mg | Freq: Four times a day (QID) | INTRAMUSCULAR | Status: DC | PRN

## 2016-09-07 MED ORDER — HYDRALAZINE HCL 20 MG/ML IJ SOLN: 10.0000 mg | Freq: Three times a day (TID) | INTRAMUSCULAR | Status: DC | PRN

## 2016-09-07 MED ORDER — DORZOLAMIDE HCL-TIMOLOL MAL 2-0.5 % OP SOLN
1.0000 [drp] | Freq: Every day | OPHTHALMIC | Status: DC
  Administered 2016-09-07 – 2016-09-08 (×2): 1 [drp] via OPHTHALMIC
  Filled 2016-09-07: qty 10

## 2016-09-07 MED ORDER — SODIUM CHLORIDE 0.9% FLUSH
3.0000 mL | Freq: Two times a day (BID) | INTRAVENOUS | Status: DC
  Administered 2016-09-07 – 2016-09-08 (×2): 3 mL via INTRAVENOUS

## 2016-09-07 MED ORDER — ACETAMINOPHEN 325 MG PO TABS: 650.0000 mg | ORAL_TABLET | Freq: Four times a day (QID) | ORAL | Status: DC | PRN

## 2016-09-07 MED ORDER — SIMVASTATIN 20 MG PO TABS
20.0000 mg | ORAL_TABLET | Freq: Every day | ORAL | Status: DC
  Administered 2016-09-07: 20 mg via ORAL
  Filled 2016-09-07: qty 1

## 2016-09-07 MED ORDER — MINOXIDIL 2.5 MG PO TABS
2.5000 mg | ORAL_TABLET | Freq: Every day | ORAL | Status: DC
  Administered 2016-09-07 – 2016-09-08 (×2): 2.5 mg via ORAL
  Filled 2016-09-07 (×2): qty 1

## 2016-09-07 MED ORDER — WARFARIN SODIUM 6 MG PO TABS
6.0000 mg | ORAL_TABLET | Freq: Once | ORAL | Status: AC
  Administered 2016-09-07: 6 mg via ORAL
  Filled 2016-09-07: qty 1

## 2016-09-07 MED ORDER — WARFARIN - PHARMACIST DOSING INPATIENT
Freq: Every day | Status: DC
  Administered 2016-09-07: 18:00:00

## 2016-09-07 MED ORDER — WARFARIN SODIUM 5 MG PO TABS: 5.0000 mg | ORAL_TABLET | ORAL | Status: DC

## 2016-09-07 MED ORDER — AMLODIPINE BESYLATE 10 MG PO TABS
10.0000 mg | ORAL_TABLET | Freq: Every day | ORAL | Status: DC
  Administered 2016-09-07 – 2016-09-08 (×2): 10 mg via ORAL
  Filled 2016-09-07 (×2): qty 1

## 2016-09-07 MED ORDER — DEXTROSE 5 % IV SOLN
1.0000 g | Freq: Three times a day (TID) | INTRAVENOUS | Status: DC
  Filled 2016-09-07 (×3): qty 1

## 2016-09-07 MED ORDER — ONDANSETRON HCL 4 MG PO TABS: 4.0000 mg | ORAL_TABLET | Freq: Four times a day (QID) | ORAL | Status: DC | PRN

## 2016-09-07 MED ORDER — LATANOPROST 0.005 % OP SOLN
1.0000 [drp] | Freq: Every day | OPHTHALMIC | Status: DC
  Administered 2016-09-07: 1 [drp] via OPHTHALMIC
  Filled 2016-09-07: qty 2.5

## 2016-09-07 MED ORDER — DOCUSATE SODIUM 100 MG PO CAPS
100.0000 mg | ORAL_CAPSULE | Freq: Two times a day (BID) | ORAL | Status: DC
  Administered 2016-09-07 – 2016-09-08 (×2): 100 mg via ORAL
  Filled 2016-09-07 (×2): qty 1

## 2016-09-07 MED ORDER — FUROSEMIDE 10 MG/ML IJ SOLN
40.0000 mg | Freq: Once | INTRAMUSCULAR | Status: AC
  Administered 2016-09-07: 40 mg via INTRAVENOUS
  Filled 2016-09-07: qty 4

## 2016-09-07 MED ORDER — LEVOTHYROXINE SODIUM 25 MCG PO TABS
25.0000 ug | ORAL_TABLET | Freq: Every day | ORAL | Status: DC
  Administered 2016-09-08: 25 ug via ORAL
  Filled 2016-09-07: qty 1

## 2016-09-07 MED ORDER — ALBUTEROL SULFATE (2.5 MG/3ML) 0.083% IN NEBU: 2.5000 mg | INHALATION_SOLUTION | RESPIRATORY_TRACT | Status: DC | PRN

## 2016-09-07 MED ORDER — CLONIDINE HCL 0.2 MG PO TABS
0.2000 mg | ORAL_TABLET | Freq: Two times a day (BID) | ORAL | Status: DC
  Administered 2016-09-07 – 2016-09-08 (×2): 0.2 mg via ORAL
  Filled 2016-09-07 (×2): qty 1

## 2016-09-07 MED ORDER — FUROSEMIDE 40 MG PO TABS
40.0000 mg | ORAL_TABLET | Freq: Every day | ORAL | Status: DC
  Administered 2016-09-08: 40 mg via ORAL
  Filled 2016-09-07: qty 1

## 2016-09-07 MED ORDER — CARVEDILOL 12.5 MG PO TABS
12.5000 mg | ORAL_TABLET | Freq: Two times a day (BID) | ORAL | Status: DC
  Administered 2016-09-07 – 2016-09-08 (×2): 12.5 mg via ORAL
  Filled 2016-09-07 (×2): qty 1

## 2016-09-07 NOTE — Progress Notes (Signed)
ANTICOAGULATION CONSULT NOTE - Initial Consult  Pharmacy Consult for Warfarin Indication: Hx DVT/CVA  Allergies  Allergen Reactions  . Ace Inhibitors Anaphylaxis  . Rocephin [Ceftriaxone Sodium In Dextrose] Anaphylaxis     Facial swelling - Unsure of penicillins, did get dose of Amoxicillin and currently has swelling  . Fish Allergy Swelling  . Phenergan [Promethazine Hcl] Other (See Comments)    Cns effects/behavioral  . Septra [Sulfamethoxazole-Trimethoprim] Other (See Comments)    Intolerance - question of swelling  . Xanax [Alprazolam]     Mood tolerance  . Clonidine Derivatives Rash    Rash attributed to glue on the clonidine patch  08/27/16 the patient is on oral Catapres without issue    Patient Measurements:     Vital Signs: Temp: 98.6 F (37 C) (10/28 1733) Temp Source: Axillary (10/28 1733) BP: 184/82 (10/28 1733) Pulse Rate: 72 (10/28 1733)  Labs:  Recent Labs  09/07/16 0936  HGB 9.7*  HCT 28.6*  PLT 269  LABPROT 20.5*  INR 1.73  CREATININE 1.42*    Estimated Creatinine Clearance: 33.5 mL/min (by C-G formula based on SCr of 1.42 mg/dL (H)).   Medical History: Past Medical History:  Diagnosis Date  . Chronic diastolic heart failure (HCC)   . CKD (chronic kidney disease)   . CVA (cerebral vascular accident) (HCC)   . Glaucoma   . Hepatic cyst 2017   7 cm hepatic cyst  . Hiatal hernia   . History of deep vein thrombosis (DVT) of lower extremity    One episode; on warfarin since  . Hyperlipidemia   . Hypertension     Medications:   Assessment: 84 YOF on warfarin PTA for hx DVT/CVA who presented on 10/28 with worsening weakness. Admit INR 1.73 on PTA dose of 5 mg daily EXCEPT for 7.5 mg on Mon/Thurs. Stated last dose PTA was on 10/27. Hgb 9.7, plts wnl - no overt s/sx of bleeding noted.  The patient is starting on Levaquin this admission which will increase warfarin sensitivity. Will give a slightly higher dose today but will not be  aggressive due to this proposed interaction.   Goal of Therapy:  INR 2-3   Plan:  1. Warfarin 6 mg x 1 dose at 1800 today 2. Daily PT/INR 3. Will continue to monitor for any signs/symptoms of bleeding and will follow up with PT/INR in the a.m.   Thank you for allowing pharmacy to be a part of this patient's care.  Georgina PillionElizabeth Lounell Schumacher, PharmD, BCPS Clinical Pharmacist Pager: [phone] 09/07/2016 5:51 PM

## 2016-09-07 NOTE — ED Provider Notes (Signed)
[facility]-EMERGENCY DEPT Provider Note   CSN: [id number] Arrival date & time: 09/07/16  [id number]     History   Chief Complaint No chief complaint on file.   HPI Jaclyn Love is a 82 y.o. female.  HPI  82 year old female presents with family with concerns for altered mental status and swelling. They feel she is lethargic and weak over past couple days. Patient has had intermittent swelling in the left hand, right leg, and face for several weeks. Family is not sure why it happens. They have noticed the swelling currently, most probably in the right leg, left hand, and face. Patient denies any pain in her leg. No obvious injuries. Patient is not mobile due to her prior stroke with right-sided deficits. Over the past month she has had 2 blood transfusions for anemia. She is currently on warfarin. Patient has not had any obvious bleeding per the family. They just got her out of a rehabilitation facility yesterday. Patient has not had fevers but over the past 1 week family has noticed that she seems a little bit off. She will sometimes say things that don't make sense. Patient has had decreased urine output over last night with only 400 mL which is low for her. She has a Foley catheter because she has not been able to void over the last 3 weeks. This Foley catheter was placed 1 week ago. Family has also noticed things in her catheter that appear like sediment. She denies abdominal pain, chest pain.  Past Medical History:  Diagnosis Date  . Chronic diastolic heart failure (HCC)   . CKD (chronic kidney disease)   . CVA (cerebral vascular accident) (HCC)   . Glaucoma   . Hepatic cyst 2017   7 cm hepatic cyst  . Hiatal hernia   . History of deep vein thrombosis (DVT) of lower extremity    One episode; on warfarin since  . Hyperlipidemia   . Hypertension     Patient Active Problem List   Diagnosis Date Noted  . Hyperlipidemia 09/06/2016  . Acute blood loss anemia 08/27/2016  . Chronic renal  insufficiency 08/27/2016  . Essential hypertension 08/27/2016  . History of stroke 08/27/2016  . Edema 08/27/2016  . Renal calculi 08/27/2016  . History of deep venous thrombosis 08/27/2016    Past Surgical History:  Procedure Laterality Date  . CHOLECYSTECTOMY  1985  . LAPAROSCOPIC LYSIS OF ADHESIONS  2006  . TOTAL ABDOMINAL HYSTERECTOMY  1975    OB History    No data available       Home Medications    Prior to Admission medications   Medication Sig Start Date End Date Taking? Authorizing Provider  buPROPion (WELLBUTRIN XL) 150 MG 24 hr tablet Take 1 tablet (150 mg total) by mouth daily. 09/06/16   Sharon SellerJessica K Eubanks, NP  Calcium 500 MG tablet Take one tablet by mouth twice daily 09/06/16   Sharon SellerJessica K Eubanks, NP  carvedilol (COREG) 12.5 MG tablet Take 1 tablet (12.5 mg total) by mouth 2 (two) times daily with a meal. 09/06/16   Sharon SellerJessica K Eubanks, NP  cloNIDine (CATAPRES) 0.2 MG tablet Take 1 tablet (0.2 mg total) by mouth 2 (two) times daily. 09/06/16   Sharon SellerJessica K Eubanks, NP  clopidogrel (PLAVIX) 75 MG tablet Take 1 tablet (75 mg total) by mouth daily. 09/06/16   Sharon SellerJessica K Eubanks, NP  docusate sodium (COLACE) 100 MG capsule Take 1 capsule (100 mg total) by mouth 2 (two) times daily. 08/26/16   Chrissie NoaWilliam  Minerva Ends, MD  dorzolamide-timolol (COSOPT) 22.3-6.8 MG/ML ophthalmic solution Place 1 drop into the left eye daily. 09/06/16   Sharon Seller, NP  ferrous sulfate 325 (65 FE) MG tablet Take 1 tablet (325 mg total) by mouth daily with breakfast. 09/06/16   Sharon Seller, NP  furosemide (LASIX) 40 MG tablet Take 1 tablet (40 mg total) by mouth daily. 09/06/16   Sharon Seller, NP  latanoprost (XALATAN) 0.005 % ophthalmic solution Place 1 drop into the left eye at bedtime. 09/06/16   Sharon Seller, NP  minoxidil (LONITEN) 2.5 MG tablet Take 1 tablet (2.5 mg total) by mouth daily. 09/06/16   Sharon Seller, NP  simvastatin (ZOCOR) 20 MG tablet Take 1 tablet (20 mg  total) by mouth at bedtime. 09/06/16   Sharon Seller, NP  warfarin (COUMADIN) 5 MG tablet Take 1 tablet (5 mg total) by mouth daily. 09/06/16   Sharon Seller, NP    Family History Family History  Problem Relation Age of Onset  . Cancer Neg Hx   . Heart disease Neg Hx   . Stroke Neg Hx   . Diabetes Neg Hx     Social History Social History  Substance Use Topics  . Smoking status: Never Smoker  . Smokeless tobacco: Never Used  . Alcohol use No     Allergies   Ace inhibitors; Fish allergy; Phenergan [promethazine hcl]; Xanax [alprazolam]; and Clonidine derivatives   Review of Systems Review of Systems  Constitutional: Positive for fatigue. Negative for fever.  Respiratory: Negative for cough and shortness of breath.   Cardiovascular: Positive for leg swelling. Negative for chest pain.  Gastrointestinal: Negative for abdominal pain and vomiting.  Genitourinary: Positive for decreased urine volume.  Neurological: Positive for weakness.  Psychiatric/Behavioral: Positive for confusion.  All other systems reviewed and are negative.    Physical Exam Updated Vital Signs BP 149/93   Pulse 74   Temp 97.8 F (36.6 C)   Resp 20   SpO2 100%   Physical Exam  Constitutional: She appears well-developed and well-nourished.  Chronically ill appearing. Right arm held across abd with some atrophy. Bilateral leg braces  HENT:  Head: Normocephalic and atraumatic.  Right Ear: External ear normal.  Left Ear: External ear normal.  Nose: Nose normal.  Eyes: Right eye exhibits no discharge. Left eye exhibits no discharge.  Cardiovascular: Normal rate, regular rhythm and normal heart sounds.   Pulmonary/Chest: Effort normal. She has rales in the right lower field.  Abdominal: Soft. She exhibits no distension. There is no tenderness.  Genitourinary:  Genitourinary Comments: Foley catheter in place with yellow urine and sediment in catheter tubing.  Musculoskeletal:  RLE with  swelling in knee and thigh. No tenderness. No warmth. Normal passive ROM of knee. No erythema  Neurological: She is alert.  Awake, alert, oriented to self, place, day of week and month. Confused on date of month, year (cannot think of it) and who the president is (has a hard time with name or describing him)  Skin: Skin is warm and dry.  Nursing note and vitals reviewed.    ED Treatments / Results  Labs (all labs ordered are listed, but only abnormal results are displayed) Labs Reviewed  URINALYSIS, ROUTINE W REFLEX MICROSCOPIC (NOT AT [facility]) - Abnormal; Notable for the following:       Result Value   APPearance CLOUDY (*)    Hgb urine dipstick MODERATE (*)    Protein, ur 30 (*)  Leukocytes, UA LARGE (*)    All other components within normal limits  CBC WITH DIFFERENTIAL/PLATELET - Abnormal; Notable for the following:    RBC 3.21 (*)    Hemoglobin 9.7 (*)    HCT 28.6 (*)    All other components within normal limits  COMPREHENSIVE METABOLIC PANEL - Abnormal; Notable for the following:    Glucose, Bld 108 (*)    Creatinine, Ser 1.42 (*)    Calcium 8.3 (*)    Total Protein 6.2 (*)    Albumin 2.0 (*)    AST 48 (*)    GFR calc non Af Amer 33 (*)    GFR calc Af Amer 39 (*)    All other components within normal limits  PROTIME-INR - Abnormal; Notable for the following:    Prothrombin Time 20.5 (*)    All other components within normal limits  BRAIN NATRIURETIC PEPTIDE - Abnormal; Notable for the following:    B Natriuretic Peptide 933.0 (*)    All other components within normal limits  URINE MICROSCOPIC-ADD ON - Abnormal; Notable for the following:    Squamous Epithelial / LPF 0-5 (*)    Bacteria, UA FEW (*)    All other components within normal limits  URINE CULTURE  TROPONIN I  COMPREHENSIVE METABOLIC PANEL  PROTIME-INR  I-STAT CG4 LACTIC ACID, ED  POC OCCULT BLOOD, ED  TYPE AND SCREEN    EKG  EKG Interpretation  Date/Time:  Saturday September 07 2016 12:06:27  EDT Ventricular Rate:  70 PR Interval:    QRS Duration: 100 QT Interval:  466 QTC Calculation: 503 R Axis:   17 Text Interpretation:  Sinus rhythm Short PR interval Consider right atrial enlargement Prolonged QT interval Artifact in lead(s) I II aVR aVL aVF V1 V2 artifact limits interpretation no obvious change since 2006 Confirmed by Silvana Holecek MD, Jaxsyn Catalfamo [phone]) on 09/07/2016 12:16:07 PM       Radiology Dg Chest 2 View  Result Date: 09/07/2016 CLINICAL DATA:  Altered mental status.  Weakness. EXAM: CHEST  2 VIEW COMPARISON:  04/27/2015 and 01/04/2012 FINDINGS: There is chronic cardiomegaly with tortuosity of the thoracic aorta and brachiocephalic vessels. Minimal atelectasis at the left lung base posteriorly. Tiny bilateral effusions. No acute bone abnormality. 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 IMPRESSION: 1. Minimal atelectasis at the left lung base with tiny bilateral pleural effusions. 2. 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 Electronically Signed   By: Francene BoyersJames  Maxwell M.D.   On: 09/07/2016 11:00   Ct Head Wo Contrast  Result Date: 09/07/2016 CLINICAL DATA:  Altered mental status EXAM: CT HEAD WITHOUT CONTRAST TECHNIQUE: Contiguous axial images were obtained from the base of the skull through the vertex without intravenous contrast. COMPARISON:  CT head 06/09/2016 FINDINGS: Brain: Moderate atrophy. Chronic infarct left basal ganglia unchanged. Chronic infarct left lateral cerebellum unchanged. Chronic microvascular ischemic change in the white matter. Negative for acute infarct.  Negative for hemorrhage or mass. Vascular: No hyperdense vessel or unexpected calcification. Skull: Negative Sinuses/Orbits: Negative Other: None IMPRESSION: Atrophy and chronic ischemic changes most notably in the deep white matter on the left. No acute abnormality. Electronically Signed   By: Marlan Palauharles  Clark M.D.   On: 09/07/2016 10:28    Procedures Procedures (including  critical care time)  Medications Ordered in ED Medications - No data to display   Initial Impression / Assessment and Plan / ED Course  I have reviewed the triage vital signs and the nursing notes.  Pertinent labs & imaging results that were available during my care of the patient were reviewed by me and considered in my medical decision making (see chart for details).  Clinical Course  Comment By Time  Will do AMS/weakness workup. Given she's on coumadin, will get CT head although this will probably be negative. She has a foley but given new sediment and mental status changes will check urine. Labs, DVT u/s given poor mobility and leg swelling. Leg swelling is most prominent in thigh and near knee but clinically does not appear to have joint effusion or knee pain/erythema Pricilla Loveless, MD 10/28 [phone]  Family wants patient admitted. Pricilla Loveless, MD 10/28 1344  Will repage hospitalist Pricilla Loveless, MD 10/28 1420  Dr Melynda Ripple to admit, med surg obs Pricilla Loveless, MD 10/28 1430    I discussed options with family, they prefer patient be admitted. I don't believe she'll qualify for more than obs but family doesn't seem like they can care for her and feel she's altered. Probably from UTI. IV lasix given swelling which is probably from heart failure  Final Clinical Impressions(s) / ED Diagnoses   Final diagnoses:  Wrist pain, acute, left  Hypervolemia, unspecified hypervolemia type  Urinary tract infection associated with indwelling urethral catheter, initial encounter [facility])    New Prescriptions New Prescriptions   No medications on file     Pricilla Loveless, MD 09/07/16 1933

## 2016-09-07 NOTE — H&P (Signed)
[facility] History and Physical  Aniqa Hare ZOX:[id number] DOB: [birth year] DOA: 09/07/2016  Referring physician:  PCP: No primary care provider on file.   Chief Complaint: "She is just getting weaker."  HPI: Jaclyn Love is a 82 y.o. female with pmhx significant for chronic kidney disease, CVA, glaucoma, history of DVT, hypertension and hyperlipidemia presents with chief complaint of worsening weakness. Patient was recently and rehabilitation for stroke resulting in right sided hemiplegia. Patient began to get progressively weaker while at her rehabilitation facility. In addition per family patient had worsening of her "mood". Patient was participating at least once a day and rehabilitation but still got weaker and weaker. In addition patient had new onset swelling in her right lower extremity. Along with recurrence of swelling in her left upper extremity. Patient's family who brought her home from rehabilitation yesterday brought her to the emergency room for evaluation.  Family concerned patient's face is swollen. Denies fever, shortness of breath, chest Pain, abdominal pain.  ED course-pulmonary read with negative right lower similar Doppler, patient started on aztreonam due to multiple antibiotic allergies. Patient was hemodynamically stable. No elevated white count  or fever.  Review of Systems:  As per HPI otherwise 10 point review of systems negative.    Past Medical History:  Diagnosis Date  . Chronic diastolic heart failure (HCC)   . CKD (chronic kidney disease)   . CVA (cerebral vascular accident) (HCC)   . Glaucoma   . Hepatic cyst 2017   7 cm hepatic cyst  . Hiatal hernia   . History of deep vein thrombosis (DVT) of lower extremity    One episode; on warfarin since  . Hyperlipidemia   . Hypertension    Past Surgical History:  Procedure Laterality Date  . CHOLECYSTECTOMY  1985  . LAPAROSCOPIC LYSIS OF ADHESIONS  2006  . TOTAL ABDOMINAL HYSTERECTOMY  1975    Social History:  reports that she has never smoked. She has never used smokeless tobacco. She reports that she does not drink alcohol or use drugs.  Allergies  Allergen Reactions  . Ace Inhibitors Anaphylaxis  . Rocephin [Ceftriaxone Sodium In Dextrose] Anaphylaxis     Facial swelling  . Fish Allergy Swelling  . Phenergan [Promethazine Hcl] Other (See Comments)    Cns effects/behavioral  . Septra [Sulfamethoxazole-Trimethoprim] Other (See Comments)    Intolerance - question of swelling  . Xanax [Alprazolam]     Mood tolerance  . Clonidine Derivatives Rash    Rash attributed to glue on the clonidine patch  08/27/16 the patient is on oral Catapres without issue    Family History  Problem Relation Age of Onset  . Cancer Neg Hx   . Heart disease Neg Hx   . Stroke Neg Hx   . Diabetes Neg Hx      Prior to Admission medications   Medication Sig Start Date End Date Taking? Authorizing Provider  amLODipine (NORVASC) 10 MG tablet Take 10 mg by mouth daily.   Yes Historical Provider, MD  ARIPiprazole (ABILIFY) 5 MG tablet Take 2.5 mg by mouth daily.   Yes Historical Provider, MD  buPROPion (WELLBUTRIN XL) 150 MG 24 hr tablet Take 1 tablet (150 mg total) by mouth daily. 09/06/16  Yes Sharon Seller, NP  Calcium 500 MG tablet Take one tablet by mouth twice daily 09/06/16  Yes Sharon Seller, NP  carvedilol (COREG) 12.5 MG tablet Take 1 tablet (12.5 mg total) by mouth 2 (two) times daily with a meal. 09/06/16  Yes Sharon SellerJessica K Eubanks, NP  cloNIDine (CATAPRES) 0.2 MG tablet Take 1 tablet (0.2 mg total) by mouth 2 (two) times daily. 09/06/16  Yes Sharon SellerJessica K Eubanks, NP  dipyridamole-aspirin (AGGRENOX) 200-25 MG 12hr capsule Take 1 capsule by mouth daily.   Yes Historical Provider, MD  docusate sodium (COLACE) 100 MG capsule Take 1 capsule (100 mg total) by mouth 2 (two) times daily. 08/26/16  Yes Pecola LawlessWilliam F Hopper, MD  dorzolamide-timolol (COSOPT) 22.3-6.8 MG/ML ophthalmic solution Place 1  drop into the left eye daily. 09/06/16  Yes Sharon SellerJessica K Eubanks, NP  furosemide (LASIX) 40 MG tablet Take 1 tablet (40 mg total) by mouth daily. 09/06/16  Yes Sharon SellerJessica K Eubanks, NP  latanoprost (XALATAN) 0.005 % ophthalmic solution Place 1 drop into the left eye at bedtime. 09/06/16  Yes Sharon SellerJessica K Eubanks, NP  levothyroxine (SYNTHROID, LEVOTHROID) 25 MCG tablet Take 25 mcg by mouth daily before breakfast.   Yes Historical Provider, MD  minoxidil (LONITEN) 2.5 MG tablet Take 1 tablet (2.5 mg total) by mouth daily. 09/06/16  Yes Sharon SellerJessica K Eubanks, NP  Multiple Vitamin (MULTIVITAMIN) tablet Take 1 tablet by mouth daily.   Yes Historical Provider, MD  simvastatin (ZOCOR) 20 MG tablet Take 1 tablet (20 mg total) by mouth at bedtime. 09/06/16  Yes Sharon SellerJessica K Eubanks, NP  warfarin (COUMADIN) 5 MG tablet Take 1 tablet (5 mg total) by mouth daily. Patient taking differently: Take 5-7.5 mg by mouth See admin instructions. Pt takes 7.5mg  on Mondays and Thursdays - takes 5mg  all other days 09/06/16  Yes Sharon SellerJessica K Eubanks, NP  clopidogrel (PLAVIX) 75 MG tablet Take 1 tablet (75 mg total) by mouth daily. Patient not taking: Reported on 09/07/2016 09/06/16   Sharon SellerJessica K Eubanks, NP  ferrous sulfate 325 (65 FE) MG tablet Take 1 tablet (325 mg total) by mouth daily with breakfast. Patient not taking: Reported on 09/07/2016 09/06/16   Sharon SellerJessica K Eubanks, NP   Physical Exam: Vitals:   09/07/16 1000 09/07/16 1215 09/07/16 1230 09/07/16 1316  BP: 165/83 179/81 180/82   Pulse: 69 71 72   Resp:  16 15   Temp:    97.9 F (36.6 C)  TempSrc:    Rectal  SpO2: 99% 98% 100%     Wt Readings from Last 3 Encounters:  09/06/16 78 kg (172 lb)    General:  Appears calm and comfortable Eyes:  PERRL, EOMI, normal lids, iris ENT:  grossly normal hearing, lips & tongue Neck:  no LAD, masses or thyromegaly Cardiovascular:  RRR, no m/r/g. Right lower extremity swelling. Respiratory:  CTA bilaterally, no w/r/r. Normal  respiratory effort. Abdomen:  soft, ntnd Skin:  no rash or induration seen on limited exam Musculoskeletal:  grossly normal tone BUE/BLE. Thenar swelling on the left hand. Swelling around the Psychiatric:  grossly normal mood and affect, speech fluent and appropriate Neurologic:  CN 2-12 grossly intact, moves all extremities in coordinated fashion. Alert and oriented to person and place. Unable to tell the year but knows the time of year and who the president is. Tremulousness movement of the left upper extremity with intention but good finger-nose-finger.           Labs on Admission:  Basic Metabolic Panel:  Recent Labs Lab 09/07/16 0936  NA 136  K 3.8  CL 104  CO2 24  GLUCOSE 108*  BUN 17  CREATININE 1.42*  CALCIUM 8.3*   Liver Function Tests:  Recent Labs Lab 09/07/16 0936  AST 48*  ALT 35  ALKPHOS 65  BILITOT 0.5  PROT 6.2*  ALBUMIN 2.0*   No results for input(s): LIPASE, AMYLASE in the last 168 hours. No results for input(s): AMMONIA in the last 168 hours. CBC:  Recent Labs Lab 09/07/16 0936  WBC 8.0  NEUTROABS 6.0  HGB 9.7*  HCT 28.6*  MCV 89.1  PLT 269   Cardiac Enzymes: No results for input(s): CKTOTAL, CKMB, CKMBINDEX, TROPONINI in the last 168 hours.  BNP (last 3 results)  Recent Labs  09/07/16 0954  BNP 933.0*    ProBNP (last 3 results) No results for input(s): PROBNP in the last 8760 hours.   Serum creatinine: 1.42 mg/dL High 69/62/9510/28/17 [id number] Estimated creatinine clearance: 33.5 mL/min  CBG: No results for input(s): GLUCAP in the last 168 hours.  Radiological Exams on Admission: Dg Chest 2 View  Result Date: 09/07/2016 CLINICAL DATA:  Altered mental status.  Weakness. EXAM: CHEST  2 VIEW COMPARISON:  04/27/2015 and 01/04/2012 FINDINGS: There is chronic cardiomegaly with tortuosity of the thoracic aorta and brachiocephalic vessels. Minimal atelectasis at the left lung base posteriorly. Tiny bilateral effusions. No acute bone  abnormality. 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 IMPRESSION: 1. Minimal atelectasis at the left lung base with tiny bilateral pleural effusions. 2. 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 Electronically Signed   By: Francene BoyersJames  Maxwell M.D.   On: 09/07/2016 11:00   Ct Head Wo Contrast  Result Date: 09/07/2016 CLINICAL DATA:  Altered mental status EXAM: CT HEAD WITHOUT CONTRAST TECHNIQUE: Contiguous axial images were obtained from the base of the skull through the vertex without intravenous contrast. COMPARISON:  CT head 06/09/2016 FINDINGS: Brain: Moderate atrophy. Chronic infarct left basal ganglia unchanged. Chronic infarct left lateral cerebellum unchanged. Chronic microvascular ischemic change in the white matter. Negative for acute infarct.  Negative for hemorrhage or mass. Vascular: No hyperdense vessel or unexpected calcification. Skull: Negative Sinuses/Orbits: Negative Other: None IMPRESSION: Atrophy and chronic ischemic changes most notably in the deep white matter on the left. No acute abnormality. Electronically Signed   By: Marlan Palauharles  Clark M.D.   On: 09/07/2016 10:28    EKG: Independently reviewed. VR 70, PR <200, QRS 100, QTc 503 - no prior EKG for comparison  Assessment/Plan Principal Problem:   UTI (urinary tract infection) Active Problems:   Chronic renal insufficiency   Essential hypertension   History of stroke   Extremity & facial swelling/CHF Preliminary read of laboratory Dopplers negative Conflicting information patient CHF diagnosis, patient & family deny diagnosis Fluid is likely dependent and due to patient's inactivity due to stroke Echo tomorrow Check trop BNP elevated Contine lasix  UTI Foley for failed voiding trial, repated Given as to number the emergency room will switch to Levaquin UCx sent Bladder training to get to free pt from foley  Acute Encephalopathy Likely due to UTI CT head neg Avoid narc meds  H xof  CVA, R side effected & worsening weakness PT eval Worsened weakness could be due to UTI Continue Aggrenox  Low thyroid Continue Synthroid  Hyperlipidemia Continue statin  Hypertension When necessary hydralazine 10 mg IV as needed for severe blood pressure Continue amlodipine, clonidine, coreg Midoxinil?  Mood Continue Abilify and Wellbutrin Hyperlipidemia Continue statin  Glaucoma Continue Cosopt and Xalatan  CKD 3 Monitor Cr daily Cr at baseline,  1.3-1.4   Status-MedSurg/obs  Code Status: Full DVT Prophylaxis: warfarin Family Communication: Daughters at bedside Disposition Plan: Pending Improvement    Haydee SalterPhillip M Hobbs, MD  Family Medicine [facility] [email or web address] Password [email or web address]

## 2016-09-07 NOTE — ED Triage Notes (Signed)
Pt's family states she has had frequent UTI's and has had a foley catheter in place for 3 weeks. Pt has been confused, and urine output is cloudy. Pt's family reports that pt's legs swelling and face appears more swollen. Pt has been more weak than normal as well.

## 2016-09-07 NOTE — Progress Notes (Signed)
ADMISSION TO UNIT Arrival Method: stretcher from ED  Mental Orientation: alert and oriented x4 Telemetry: placed per order and verified with CCMD Assessment: Completed Skin: stage II to right buttock Iv: left forearm Pain: denies pain Tubes: chronic foley catheter Safety Measures: Safety Fall Prevention Plan has been given, discussed and signed; patient is high fall risk Admission: Completed Unit Orientation: Patient has been orientated to the room, unit and staff.  Family: at bedside  Orders have been reviewed and implemented. Will continue to monitor the patient. Call light has been placed within reach and bed alarm has been activated.

## 2016-09-07 NOTE — Progress Notes (Signed)
Pharmacy Antibiotic Note  Jaclyn Love is a 82 y.o. female admitted on 09/07/2016 with UTI.  Pharmacy has been consulted for Aztreonam dosing.  [facility] during [facility]13 admission was Klebsiella pneumoniae and was only resistant to nitrofurantion and ampicillin.  Multiple allergies noted including Rocephin and possibly Amoxicillin and Bactrim.  Noted on Coumadin so monitor for drug drug interactions.   Plan: Aztreonam 1g IV every 8 hours.   Follow-up urine culture for ability to narrow. With history of multiple allergies, monitor for signs and symptoms of reaction     Temp (24hrs), Avg:97.9 F (36.6 C), Min:97.8 F (36.6 C), Max:97.9 F (36.6 C)   Recent Labs Lab 09/07/16 0936 09/07/16 0953  WBC 8.0  --   CREATININE 1.42*  --   LATICACIDVEN  --  1.22    Estimated Creatinine Clearance: 33.5 mL/min (by C-G formula based on SCr of 1.42 mg/dL (H)).    Allergies  Allergen Reactions  . Ace Inhibitors Anaphylaxis  . Rocephin [Ceftriaxone Sodium In Dextrose] Anaphylaxis     Facial swelling  . Fish Allergy Swelling  . Phenergan [Promethazine Hcl] Other (See Comments)    Cns effects/behavioral  . Septra [Sulfamethoxazole-Trimethoprim] Other (See Comments)    Intolerance - question of swelling  . Xanax [Alprazolam]     Mood tolerance  . Clonidine Derivatives Rash    Rash attributed to glue on the clonidine patch  08/27/16 the patient is on oral Catapres without issue    Antimicrobials this admission: 10/28 Aztreonam >>  Dose adjustments this admission: none  Microbiology results: 10/28 UCx:   Thank you for allowing pharmacy to be a part of this patient's care.  Link SnufferJessica Doll Frazee, PharmD, BCPS Clinical Pharmacist [phone] today until 3:30 PM [phone] after hours 09/07/2016 2:26 PM

## 2016-09-07 NOTE — Progress Notes (Addendum)
VASCULAR LAB PRELIMINARY  PRELIMINARY  PRELIMINARY  PRELIMINARY  Right lower extremity venous duplex completed.    Preliminary report:  Technically difficult due to jerking of the leg and size of rhe veins. Right:  No evidence of DVT, superficial thrombosis, or Baker's cyst.  Jaclyn Love, RVS 09/07/2016, 11:35 AM

## 2016-09-07 NOTE — ED Notes (Signed)
Pt. Returned from Chest X-ray

## 2016-09-07 NOTE — Progress Notes (Signed)
Called ED for report; Lori to call me back to give report.

## 2016-09-08 DIAGNOSIS — N390 Urinary tract infection, site not specified: Secondary | ICD-10-CM

## 2016-09-08 DIAGNOSIS — T83511A Infection and inflammatory reaction due to indwelling urethral catheter, initial encounter: Secondary | ICD-10-CM | POA: Diagnosis not present

## 2016-09-08 DIAGNOSIS — L8990 Pressure ulcer of unspecified site, unspecified stage: Secondary | ICD-10-CM | POA: Insufficient documentation

## 2016-09-08 LAB — COMPREHENSIVE METABOLIC PANEL
ALK PHOS: 59 U/L (ref 38–126)
ALT: 28 U/L (ref 14–54)
AST: 32 U/L (ref 15–41)
Albumin: 1.9 g/dL — ABNORMAL LOW (ref 3.5–5.0)
Anion gap: 8 (ref 5–15)
BUN: 13 mg/dL (ref 6–20)
CALCIUM: 8 mg/dL — AB (ref 8.9–10.3)
CO2: 22 mmol/L (ref 22–32)
CREATININE: 1.33 mg/dL — AB (ref 0.44–1.00)
Chloride: 105 mmol/L (ref 101–111)
GFR calc non Af Amer: 36 mL/min — ABNORMAL LOW (ref >60)
GFR, EST AFRICAN AMERICAN: 42 mL/min — AB (ref >60)
GLUCOSE: 85 mg/dL (ref 65–99)
Potassium: 3.5 mmol/L (ref 3.5–5.1)
SODIUM: 135 mmol/L (ref 135–145)
Total Bilirubin: 0.2 mg/dL — ABNORMAL LOW (ref 0.3–1.2)
Total Protein: 5.9 g/dL — ABNORMAL LOW (ref 6.5–8.1)

## 2016-09-08 LAB — PROTIME-INR
INR: 1.99
Prothrombin Time: 22.9 seconds — ABNORMAL HIGH (ref 11.4–15.2)

## 2016-09-08 LAB — MRSA PCR SCREENING: MRSA by PCR: NEGATIVE

## 2016-09-08 MED ORDER — FUROSEMIDE 20 MG PO TABS: 20.0000 mg | ORAL_TABLET | Freq: Every day | ORAL | 0 refills | Status: AC

## 2016-09-08 MED ORDER — SENNOSIDES-DOCUSATE SODIUM 8.6-50 MG PO TABS: 1.0000 | ORAL_TABLET | Freq: Once | ORAL | Status: DC

## 2016-09-08 MED ORDER — WARFARIN SODIUM 5 MG PO TABS: 5.0000 mg | ORAL_TABLET | Freq: Once | ORAL | Status: DC

## 2016-09-08 MED ORDER — SORBITOL 70 % SOLN: 15.0000 mL | Status: DC

## 2016-09-08 NOTE — Discharge Summary (Addendum)
Physician Discharge Summary  Jaclyn Blalockhelma Dolney ZOX:[id number] DOB: 07/17/1931 DOA: 09/07/2016  PCP: No primary care provider on file.  Admit date: 09/07/2016 Discharge date: 09/08/2016  Time spent: 30 minutes  Recommendations for Outpatient Follow-up:  1. recommend HH on d/c 2. Needs OP echocardiogram to determine HF  3. Increased lasix to 40 am, 20 pm 4. Labs within 1 week 5. Needs OP follow up for FOley cathter  Discharge Diagnoses:  Principal Problem:   UTI (urinary tract infection) Active Problems:   Chronic renal insufficiency   Essential hypertension   History of stroke   Fluid overload   Pressure injury of skin   Discharge Condition: fair  Diet recommendation: hh low salt  Filed Weights   09/07/16 1733 09/07/16 2202  Weight: 77.8 kg (171 lb 8.3 oz) 77.9 kg (171 lb 11.8 oz)    History of present illness:    Hospital Course:  82 y.o. female with pmhx significant for chronic kidney disease, CVA, glaucoma, history of DVT, hypertension and hyperlipidemia presents with chief complaint of worsening weakness. Patient was recently and rehabilitation for stroke resulting in right sided hemiplegia [facility] She apparently had transfusions without sourc eof bleeding ascertained Overall weakness and found t have a UTI in the recent past Has chr indwelling foley being followed by Urology Was obs overnight for weakenss She had colonization of catheter with yeast but no WBC It was felt LE swelling was potnetially 2/2 to mild AECHF She was d/c on slightly higher dose lasix 20 mg in pm in addition to am lasix and encouraged t follow up as OP with regular MD soon   Discharge Exam: Vitals:   09/08/16 0423 09/08/16 1000  BP: (!) 157/78 (!) 157/73  Pulse: 79 77  Resp: 20 18  Temp: 98.2 F (36.8 C) 98.5 F (36.9 C)    General: alert slightly disoriented in nad Cardiovascular: s1 s2 no m/r/g Respiratory:  clear no adde dsound  Discharge Instructions   Discharge Instructions    Diet - low sodium heart healthy    Complete by:  As directed    Increase activity slowly    Complete by:  As directed      Current Discharge Medication List    START taking these medications   Details  !! furosemide (LASIX) 20 MG tablet Take 1 tablet (20 mg total) by mouth daily with lunch. Qty: 30 tablet, Refills: 0     !! - Potential duplicate medications found. Please discuss with provider.    CONTINUE these medications which have NOT CHANGED   Details  amLODipine (NORVASC) 10 MG tablet Take 10 mg by mouth daily.    ARIPiprazole (ABILIFY) 5 MG tablet Take 2.5 mg by mouth daily.    buPROPion (WELLBUTRIN XL) 150 MG 24 hr tablet Take 1 tablet (150 mg total) by mouth daily. Qty: 30 tablet, Refills: 0    Calcium 500 MG tablet Take one tablet by mouth twice daily Qty: 60 tablet, Refills: 0    carvedilol (COREG) 12.5 MG tablet Take 1 tablet (12.5 mg total) by mouth 2 (two) times daily with a meal. Qty: 60 tablet, Refills: 0   Associated Diagnoses: Essential hypertension    cloNIDine (CATAPRES) 0.2 MG tablet Take 1 tablet (0.2 mg total) by mouth 2 (two) times daily. Qty: 60 tablet, Refills: 0   Associated Diagnoses: Essential hypertension    dipyridamole-aspirin (AGGRENOX) 200-25 MG 12hr capsule Take 1 capsule by mouth  daily.    dorzolamide-timolol (COSOPT) 22.3-6.8 MG/ML ophthalmic solution Place 1 drop into the left eye daily. Qty: 10 mL, Refills: 0    !! furosemide (LASIX) 40 MG tablet Take 1 tablet (40 mg total) by mouth daily. Qty: 30 tablet, Refills: 0   Associated Diagnoses: Localized edema    latanoprost (XALATAN) 0.005 % ophthalmic solution Place 1 drop into the left eye at bedtime. Qty: 2.5 mL, Refills: 0    levothyroxine (SYNTHROID, LEVOTHROID) 25 MCG tablet Take 25 mcg by mouth daily before breakfast.    minoxidil (LONITEN) 2.5 MG tablet Take 1 tablet (2.5 mg total) by mouth  daily. Qty: 30 tablet, Refills: 0   Associated Diagnoses: Essential hypertension    Multiple Vitamin (MULTIVITAMIN) tablet Take 1 tablet by mouth daily.    simvastatin (ZOCOR) 20 MG tablet Take 1 tablet (20 mg total) by mouth at bedtime. Qty: 30 tablet, Refills: 0   Associated Diagnoses: Hyperlipidemia, unspecified hyperlipidemia type    warfarin (COUMADIN) 5 MG tablet Take 1 tablet (5 mg total) by mouth daily. Qty: 30 tablet, Refills: 0   Associated Diagnoses: History of deep venous thrombosis    ferrous sulfate 325 (65 FE) MG tablet Take 1 tablet (325 mg total) by mouth daily with breakfast. Qty: 30 tablet, Refills: 0   Associated Diagnoses: Acute blood loss anemia     !! - Potential duplicate medications found. Please discuss with provider.    STOP taking these medications     docusate sodium (COLACE) 100 MG capsule      clopidogrel (PLAVIX) 75 MG tablet        Allergies  Allergen Reactions  . Ace Inhibitors Anaphylaxis  . Rocephin [Ceftriaxone Sodium In Dextrose] Anaphylaxis     Facial swelling - Unsure of penicillins, did get dose of Amoxicillin and currently has swelling  . Fish Allergy Swelling  . Phenergan [Promethazine Hcl] Other (See Comments)    Cns effects/behavioral  . Septra [Sulfamethoxazole-Trimethoprim] Other (See Comments)    Intolerance - question of swelling  . Xanax [Alprazolam]     Mood tolerance  . Clonidine Derivatives Rash    Rash attributed to glue on the clonidine patch  08/27/16 the patient is on oral Catapres without issue      The results of significant diagnostics from this hospitalization (including imaging, microbiology, ancillary and laboratory) are listed below for reference.    Significant Diagnostic Studies: Dg Chest 2 View  Result Date: 09/07/2016 CLINICAL DATA:  Altered mental status.  Weakness. EXAM: CHEST  2 VIEW COMPARISON:  04/27/2015 and 01/04/2012 FINDINGS: There is chronic cardiomegaly with tortuosity of the thoracic  aorta and brachiocephalic vessels. Minimal atelectasis at the left lung base posteriorly. Tiny bilateral effusions. No acute bone abnormality. 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 IMPRESSION: 1. Minimal atelectasis at the left lung base with tiny bilateral pleural effusions. 2. 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 Electronically Signed   By: Francene BoyersJames  Maxwell M.D.   On: 09/07/2016 11:00   Dg Wrist Complete Left  Result Date: 09/07/2016 CLINICAL DATA:  Pt c/o left wrist pain with some swelling medially x 1 week. No known injury per pt. EXAM: LEFT WRIST - COMPLETE 3+ VIEW COMPARISON:  None. FINDINGS: There is prominent degenerative osteoarthritis at the [facility], with associated radial subluxation of the first metacarpal bone. There is also prominent degenerative joint space narrowing at the radiocarpal joint space with associated abutment of the lunate bone and underlying radius/ulna. There is  a scapholunate dissociation, presumably chronic, severe in degree with approximately 7 mm diastasis. There is associated ulnar subluxation of the lunate, triquetrum and pisiform bones. There is at least mild diffuse osteopenia. The osteopenia limits characterization of osseous detail but there is no acute or suspicious osseous lesion seen. No fracture line or acute-appearing fracture fragment. No destructive change to suggest osteomyelitis. There is soft tissue swelling, lateral greater than medial. IMPRESSION: 1. Degenerative/chronic findings detailed above. 2. No evidence of acute osseous abnormality. 3. Soft tissue swelling. Electronically Signed   By: Bary Richard M.D.   On: 09/07/2016 19:32   Ct Head Wo Contrast  Result Date: 09/07/2016 CLINICAL DATA:  Altered mental status EXAM: CT HEAD WITHOUT CONTRAST TECHNIQUE: Contiguous axial images were obtained from the base of the skull through the vertex without intravenous contrast. COMPARISON:  CT head 06/09/2016  FINDINGS: Brain: Moderate atrophy. Chronic infarct left basal ganglia unchanged. Chronic infarct left lateral cerebellum unchanged. Chronic microvascular ischemic change in the white matter. Negative for acute infarct.  Negative for hemorrhage or mass. Vascular: No hyperdense vessel or unexpected calcification. Skull: Negative Sinuses/Orbits: Negative Other: None IMPRESSION: Atrophy and chronic ischemic changes most notably in the deep white matter on the left. No acute abnormality. Electronically Signed   By: Marlan Palau M.D.   On: 09/07/2016 10:28    Microbiology: Recent Results (from the past 240 hour(s))  Urine culture     Status: Abnormal (Preliminary result)   Collection Time: 09/07/16 12:07 PM  Result Value Ref Range Status   Specimen Description URINE, CATHETERIZED  Final   Special Requests NONE  Final   Culture >=100,000 COLONIES/mL YEAST (A)  Final   Report Status PENDING  Incomplete  MRSA PCR Screening     Status: None   Collection Time: 09/08/16  1:09 AM  Result Value Ref Range Status   MRSA by PCR NEGATIVE NEGATIVE Final    Comment:        The GeneXpert MRSA Assay (FDA approved for NASAL specimens only), is one component of a comprehensive MRSA colonization surveillance program. It is not intended to diagnose MRSA infection nor to guide or monitor treatment for MRSA infections.      Labs: Basic Metabolic Panel:  Recent Labs Lab 09/07/16 0936 09/08/16 0621  NA 136 135  K 3.8 3.5  CL 104 105  CO2 24 22  GLUCOSE 108* 85  BUN 17 13  CREATININE 1.42* 1.33*  CALCIUM 8.3* 8.0*   Liver Function Tests:  Recent Labs Lab 09/07/16 0936 09/08/16 0621  AST 48* 32  ALT 35 28  ALKPHOS 65 59  BILITOT 0.5 0.2*  PROT 6.2* 5.9*  ALBUMIN 2.0* 1.9*   No results for input(s): LIPASE, AMYLASE in the last 168 hours. No results for input(s): AMMONIA in the last 168 hours. CBC:  Recent Labs Lab 09/07/16 0936  WBC 8.0  NEUTROABS 6.0  HGB 9.7*  HCT 28.6*  MCV  89.1  PLT 269   Cardiac Enzymes:  Recent Labs Lab 09/07/16 1723  TROPONINI <0.03   BNP: BNP (last 3 results)  Recent Labs  09/07/16 0954  BNP 933.0*    ProBNP (last 3 results) No results for input(s): PROBNP in the last 8760 hours.  CBG: No results for input(s): GLUCAP in the last 168 hours.     SignedRhetta Mura MD   [facility] 09/08/2016, 11:31 AM

## 2016-09-08 NOTE — Progress Notes (Signed)
ANTICOAGULATION CONSULT NOTE - Follow Up Consult  Pharmacy Consult for Warfarin Indication: Hx DVT/CVA  Allergies  Allergen Reactions  . Ace Inhibitors Anaphylaxis  . Rocephin [Ceftriaxone Sodium In Dextrose] Anaphylaxis     Facial swelling - Unsure of penicillins, did get dose of Amoxicillin and currently has swelling  . Fish Allergy Swelling  . Phenergan [Promethazine Hcl] Other (See Comments)    Cns effects/behavioral  . Septra [Sulfamethoxazole-Trimethoprim] Other (See Comments)    Intolerance - question of swelling  . Xanax [Alprazolam]     Mood tolerance  . Clonidine Derivatives Rash    Rash attributed to glue on the clonidine patch  08/27/16 the patient is on oral Catapres without issue    Patient Measurements: Weight: 171 lb 11.8 oz (77.9 kg)  Vital Signs: Temp: 98.5 F (36.9 C) (10/29 1000) Temp Source: Oral (10/29 1000) BP: 157/73 (10/29 1000) Pulse Rate: 77 (10/29 1000)  Labs:  Recent Labs  09/07/16 0936 09/07/16 1723 09/08/16 0621  HGB 9.7*  --   --   HCT 28.6*  --   --   PLT 269  --   --   LABPROT 20.5*  --  22.9*  INR 1.73  --  1.99  CREATININE 1.42*  --  1.33*  TROPONINI  --  <0.03  --    Estimated Creatinine Clearance: 35.8 mL/min (by C-G formula based on SCr of 1.33 mg/dL (H)).  Medications:  Prescriptions Prior to Admission  Medication Sig Dispense Refill Last Dose  . amLODipine (NORVASC) 10 MG tablet Take 10 mg by mouth daily.   09/06/2016 at Unknown time  . ARIPiprazole (ABILIFY) 5 MG tablet Take 2.5 mg by mouth daily.   09/06/2016 at Unknown time  . buPROPion (WELLBUTRIN XL) 150 MG 24 hr tablet Take 1 tablet (150 mg total) by mouth daily. 30 tablet 0 09/06/2016 at Unknown time  . Calcium 500 MG tablet Take one tablet by mouth twice daily 60 tablet 0 09/06/2016 at Unknown time  . carvedilol (COREG) 12.5 MG tablet Take 1 tablet (12.5 mg total) by mouth 2 (two) times daily with a meal. 60 tablet 0 09/06/2016 at 1800  . cloNIDine (CATAPRES)  0.2 MG tablet Take 1 tablet (0.2 mg total) by mouth 2 (two) times daily. 60 tablet 0 09/06/2016 at Unknown time  . dipyridamole-aspirin (AGGRENOX) 200-25 MG 12hr capsule Take 1 capsule by mouth daily.   09/06/2016 at Unknown time  . docusate sodium (COLACE) 100 MG capsule Take 1 capsule (100 mg total) by mouth 2 (two) times daily. 60 capsule 0 09/06/2016 at Unknown time  . dorzolamide-timolol (COSOPT) 22.3-6.8 MG/ML ophthalmic solution Place 1 drop into the left eye daily. 10 mL 0 09/06/2016 at Unknown time  . furosemide (LASIX) 40 MG tablet Take 1 tablet (40 mg total) by mouth daily. 30 tablet 0 09/06/2016 at Unknown time  . latanoprost (XALATAN) 0.005 % ophthalmic solution Place 1 drop into the left eye at bedtime. 2.5 mL 0 09/06/2016 at Unknown time  . levothyroxine (SYNTHROID, LEVOTHROID) 25 MCG tablet Take 25 mcg by mouth daily before breakfast.   09/06/2016 at Unknown time  . minoxidil (LONITEN) 2.5 MG tablet Take 1 tablet (2.5 mg total) by mouth daily. 30 tablet 0 09/06/2016 at Unknown time  . Multiple Vitamin (MULTIVITAMIN) tablet Take 1 tablet by mouth daily.   09/06/2016 at Unknown time  . simvastatin (ZOCOR) 20 MG tablet Take 1 tablet (20 mg total) by mouth at bedtime. 30 tablet 0 09/06/2016 at Unknown time  .  warfarin (COUMADIN) 5 MG tablet Take 1 tablet (5 mg total) by mouth daily. (Patient taking differently: Take 5-7.5 mg by mouth See admin instructions. Pt takes 7.5mg  on Mondays and Thursdays - takes 5mg  all other days) 30 tablet 0 09/06/2016 at 1800  . clopidogrel (PLAVIX) 75 MG tablet Take 1 tablet (75 mg total) by mouth daily. (Patient not taking: Reported on 09/07/2016) 30 tablet 0 Not Taking at Unknown time  . ferrous sulfate 325 (65 FE) MG tablet Take 1 tablet (325 mg total) by mouth daily with breakfast. (Patient not taking: Reported on 09/07/2016) 30 tablet 0 Not Taking at Unknown time    Assessment: [age] YOF on warfarin PTA for hx DVT/CVA who presented on 10/28 with worsening  weakness. Admit INR 1.73 on PTA dose of 5 mg daily EXCEPT for 7.5 mg on Mon/Thurs. Stated last dose PTA was on 10/27. Hgb low but stable 9.7, plts wnl - no overt s/sx of bleeding noted.The patient is starting on Levaquin this admission which will potentiate warfarin.   INR today: 1.99  Goal of Therapy:  INR 2-3 Monitor platelets by anticoagulation protocol: Yes   Plan:  1. Warfarin 5 mg x 1 dose at 1800 today 2. Daily PT/INR 3. Monitor s/sx of bleeding   Thank you for allowing pharmacy to be a part of this patient's care.  Alinda Moneyony L Greg Eckrich 09/08/2016,2:02 PM

## 2016-09-08 NOTE — Evaluation (Signed)
Physical Therapy Evaluation Patient Details Name: Jaclyn Love MRN: [id number] DOB: 04/03/1931 Today's Date: 09/08/2016   History of Present Illness  Patient is an 82 yo female admitted 09/07/16 with increasing weakness.  Patient was d/c from SNF on 09/06/16 to home post rehab for stroke with Rt hemiparesis.  Family noted increased weakness, edema RLE.  Patient now with UTI, acute encephalopathy, HTN, weakness.    PMH:  CVA with Rt hemiparesis, CKD, HTN, HLD, glaucoma, CHF, DVT  Clinical Impression  Patient presents with problems listed below.  Patient to d/c home today with assist of family and Aide.  Patient to resume HHPT.  Daughter reports no further needs at home.    Follow Up Recommendations Home health PT;Supervision/Assistance - 24 hour    Equipment Recommendations  None recommended by PT    Recommendations for Other Services       Precautions / Restrictions Precautions Precautions: Fall Required Braces or Orthoses: Other Brace/Splint Other Brace/Splint: Double upright brace on shoe Rt LE. Restrictions Weight Bearing Restrictions: No      Mobility  Bed Mobility Overal bed mobility: Needs Assistance Bed Mobility: Supine to Sit;Sit to Supine     Supine to sit: Mod assist Sit to supine: Max assist   General bed mobility comments: Assist to bring RLE off of bed and raise trunk to upright position.  Assist to maintain sitting balance.  Attempted to have patient move sit > sidelying, but patient unable to process instructions, sequence, or follow commands to move to sidelying.  Required max assist to bring LE's onto bed to return to supine.  Transfers Overall transfer level: Needs assistance Equipment used: 1 person hand held assist;Rolling walker (2 wheeled) Transfers: Sit to/from Stand Sit to Stand: Max assist         General transfer comment: Verbal cues to translate trunk forward over feet to move to standing.  Attempted x2 with max assist, and patient unable to  clear hips from bed.  Extending LE's and pushing trunk backward.  On 3rd attempt, provided max-total assist and patient able to move to upright position.  Patient with posterior and Rt lateral lean, requiring max assist to maintain upright position.  Patient stood x 40 seconds and returned to sitting.  Ambulation/Gait                Stairs            Wheelchair Mobility    Modified Rankin (Stroke Patients Only)       Balance Overall balance assessment: Needs assistance Sitting-balance support: Single extremity supported;Feet supported Sitting balance-Leahy Scale: Poor Sitting balance - Comments: Required mod assist for balance. Postural control: Posterior lean Standing balance support: Single extremity supported Standing balance-Leahy Scale: Zero Standing balance comment: Max assist to maintain upright position.                             Pertinent Vitals/Pain Pain Assessment: No/denies pain    Home Living Family/patient expects to be discharged to:: Private residence Living Arrangements: Children (With son) Available Help at Discharge: Family;Personal care attendant;Available 24 hours/day (Aide 7-8 hrs/day; Son pm/nights; Daughters prn) Type of Home: House Home Access: Level entry     Home Layout: One level Home Equipment: Cane - quad;Bedside commode;Shower seat;Hand held shower head;Wheelchair - manual (Hemiwalker; Lift chair)      Prior Function Level of Independence: Needs assistance   Gait / Transfers Assistance Needed: Patient had been ambulating with  quad cane with assist.  Now only transfers bed <> w/c with max assist.  ADL's / Homemaking Assistance Needed: Assist with all ADL's, meal prep, housekeeping.  Able to feed self.        Hand Dominance        Extremity/Trunk Assessment   Upper Extremity Assessment: RUE deficits/detail;LUE deficits/detail RUE Deficits / Details: Increased flexor tone.  Positioned with elbow flexed and  hand fisted.  Able to open hand partially.   RUE Sensation: decreased light touch LUE Deficits / Details: Decreased shoulder ROM to 90* flexion.  Strength at least 3/5   Lower Extremity Assessment: RLE deficits/detail;LLE deficits/detail RLE Deficits / Details: Strength 2+/5 at hip and knee.  Drop foot noted with no DF strength. LLE Deficits / Details: Strength grossly 4-/5  Cervical / Trunk Assessment: Kyphotic  Communication   Communication: Expressive difficulties (Difficult to understand)  Cognition Arousal/Alertness: Awake/alert Behavior During Therapy: Flat affect Overall Cognitive Status: Impaired/Different from baseline Area of Impairment: Orientation;Memory;Following commands;Problem solving Orientation Level: Disoriented to;Time;Situation   Memory: Decreased short-term memory Following Commands: Follows one step commands with increased time     Problem Solving: Slow processing;Decreased initiation;Difficulty sequencing;Requires verbal cues;Requires tactile cues      General Comments      Exercises     Assessment/Plan    PT Assessment Patient needs continued PT services;All further PT needs can be met in the next venue of care  PT Problem List Decreased strength;Decreased activity tolerance;Decreased balance;Decreased mobility;Decreased coordination;Decreased cognition;Decreased knowledge of use of DME;Impaired sensation;Impaired tone          PT Treatment Interventions      PT Goals (Current goals can be found in the Care Plan section)  Acute Rehab PT Goals PT Goal Formulation: All assessment and education complete, DC therapy (With f/u HHPT)    Frequency  (To d/c today with f/u HHPT)   Barriers to discharge        Co-evaluation               End of Session Equipment Utilized During Treatment: Gait belt Activity Tolerance: Patient limited by fatigue Patient left: in bed;with call bell/phone within reach;with bed alarm set;with family/visitor  present      Functional Assessment Tool Used: Clinical judgement Functional Limitation: Mobility: Walking and moving around Mobility: Walking and Moving Around Current Status ([id number]): At least 60 percent but less than 80 percent impaired, limited or restricted Mobility: Walking and Moving Around Goal Status [phone]): At least 60 percent but less than 80 percent impaired, limited or restricted Mobility: Walking and Moving Around Discharge Status [phone]): At least 60 percent but less than 80 percent impaired, limited or restricted    Time: 1275-1700 PT Time Calculation (min) (ACUTE ONLY): 21 min   Charges:   PT Evaluation $PT Eval Moderate Complexity: 1 Procedure     PT G Codes:   PT G-Codes **NOT FOR INPATIENT CLASS** Functional Assessment Tool Used: Clinical judgement Functional Limitation: Mobility: Walking and moving around Mobility: Walking and Moving Around Current Status ([id number]): At least 60 percent but less than 80 percent impaired, limited or restricted Mobility: Walking and Moving Around Goal Status [phone]): At least 60 percent but less than 80 percent impaired, limited or restricted Mobility: Walking and Moving Around Discharge Status [phone]): At least 60 percent but less than 80 percent impaired, limited or restricted    Despina Pole 09/08/2016, 2:19 PM Carita Pian. Sanjuana Kava, [place] Pager [phone]

## 2016-09-08 NOTE — H&P (Signed)
At 928pm on 10/28/17Rn paged Dr. Selena BattenKim requesting rectal suppository for patient  At family request, they state patients last B, was 09/03/16. Order written for Doculax suppository by Dr Selena Battenkim. RN will place and monitor for  outcomes

## 2016-09-08 NOTE — Discharge Instructions (Signed)

## 2016-09-08 NOTE — Progress Notes (Signed)
Reviewed discharge instructions and medications with patient and her family per her request. All questions have been answered. Eye medications given to patient to take home. Assessment is as charted.  Patient left in stable condition via wheelchair.

## 2016-09-08 NOTE — Progress Notes (Signed)
Reviewed discharge instructions and medications with patient and family; all questions answered. Order for home health to resume with [facility]. Assessment is as charted. Patient stable and is leaving unit via wheelchair.

## 2016-09-09 DIAGNOSIS — N3000 Acute cystitis without hematuria: Secondary | ICD-10-CM | POA: Diagnosis not present

## 2016-09-09 DIAGNOSIS — H409 Unspecified glaucoma: Secondary | ICD-10-CM | POA: Diagnosis not present

## 2016-09-09 DIAGNOSIS — I69351 Hemiplegia and hemiparesis following cerebral infarction affecting right dominant side: Secondary | ICD-10-CM | POA: Diagnosis not present

## 2016-09-09 DIAGNOSIS — E785 Hyperlipidemia, unspecified: Secondary | ICD-10-CM | POA: Diagnosis not present

## 2016-09-09 DIAGNOSIS — I10 Essential (primary) hypertension: Secondary | ICD-10-CM | POA: Diagnosis not present

## 2016-09-09 DIAGNOSIS — D649 Anemia, unspecified: Secondary | ICD-10-CM | POA: Diagnosis not present

## 2016-09-09 LAB — URINE CULTURE: Culture: >=100000 — AB

## 2016-09-10 DIAGNOSIS — I10 Essential (primary) hypertension: Secondary | ICD-10-CM | POA: Diagnosis not present

## 2016-09-10 DIAGNOSIS — D649 Anemia, unspecified: Secondary | ICD-10-CM | POA: Diagnosis not present

## 2016-09-10 DIAGNOSIS — H409 Unspecified glaucoma: Secondary | ICD-10-CM | POA: Diagnosis not present

## 2016-09-10 DIAGNOSIS — I69351 Hemiplegia and hemiparesis following cerebral infarction affecting right dominant side: Secondary | ICD-10-CM | POA: Diagnosis not present

## 2016-09-10 DIAGNOSIS — N3000 Acute cystitis without hematuria: Secondary | ICD-10-CM | POA: Diagnosis not present

## 2016-09-10 DIAGNOSIS — E785 Hyperlipidemia, unspecified: Secondary | ICD-10-CM | POA: Diagnosis not present

## 2016-09-12 DIAGNOSIS — E785 Hyperlipidemia, unspecified: Secondary | ICD-10-CM | POA: Diagnosis not present

## 2016-09-12 DIAGNOSIS — I69351 Hemiplegia and hemiparesis following cerebral infarction affecting right dominant side: Secondary | ICD-10-CM | POA: Diagnosis not present

## 2016-09-12 DIAGNOSIS — N3000 Acute cystitis without hematuria: Secondary | ICD-10-CM | POA: Diagnosis not present

## 2016-09-12 DIAGNOSIS — H409 Unspecified glaucoma: Secondary | ICD-10-CM | POA: Diagnosis not present

## 2016-09-12 DIAGNOSIS — D649 Anemia, unspecified: Secondary | ICD-10-CM | POA: Diagnosis not present

## 2016-09-12 DIAGNOSIS — I10 Essential (primary) hypertension: Secondary | ICD-10-CM | POA: Diagnosis not present

## 2016-09-16 DIAGNOSIS — I10 Essential (primary) hypertension: Secondary | ICD-10-CM | POA: Diagnosis not present

## 2016-09-16 DIAGNOSIS — D649 Anemia, unspecified: Secondary | ICD-10-CM | POA: Diagnosis not present

## 2016-09-16 DIAGNOSIS — N319 Neuromuscular dysfunction of bladder, unspecified: Secondary | ICD-10-CM | POA: Diagnosis not present

## 2016-09-16 DIAGNOSIS — N3000 Acute cystitis without hematuria: Secondary | ICD-10-CM | POA: Diagnosis not present

## 2016-09-16 DIAGNOSIS — I69351 Hemiplegia and hemiparesis following cerebral infarction affecting right dominant side: Secondary | ICD-10-CM | POA: Diagnosis not present

## 2016-09-16 DIAGNOSIS — H409 Unspecified glaucoma: Secondary | ICD-10-CM | POA: Diagnosis not present

## 2016-09-16 DIAGNOSIS — E785 Hyperlipidemia, unspecified: Secondary | ICD-10-CM | POA: Diagnosis not present

## 2016-09-16 DIAGNOSIS — N390 Urinary tract infection, site not specified: Secondary | ICD-10-CM | POA: Diagnosis not present

## 2016-09-17 DIAGNOSIS — I69351 Hemiplegia and hemiparesis following cerebral infarction affecting right dominant side: Secondary | ICD-10-CM | POA: Diagnosis not present

## 2016-09-17 DIAGNOSIS — H409 Unspecified glaucoma: Secondary | ICD-10-CM | POA: Diagnosis not present

## 2016-09-17 DIAGNOSIS — N3000 Acute cystitis without hematuria: Secondary | ICD-10-CM | POA: Diagnosis not present

## 2016-09-17 DIAGNOSIS — E785 Hyperlipidemia, unspecified: Secondary | ICD-10-CM | POA: Diagnosis not present

## 2016-09-17 DIAGNOSIS — I10 Essential (primary) hypertension: Secondary | ICD-10-CM | POA: Diagnosis not present

## 2016-09-17 DIAGNOSIS — D649 Anemia, unspecified: Secondary | ICD-10-CM | POA: Diagnosis not present

## 2016-09-18 ENCOUNTER — Encounter (HOSPITAL_COMMUNITY): Payer: Self-pay

## 2016-09-18 ENCOUNTER — Encounter (HOSPITAL_COMMUNITY)
Admission: RE | Admit: 2016-09-18 | Discharge: 2016-09-18 | Disposition: A | Discharge status: Home / Self Care | Payer: Medicare Other | Source: Ambulatory Visit | Attending: Chiropractic Medicine | Admitting: Chiropractic Medicine

## 2016-09-18 DIAGNOSIS — D509 Iron deficiency anemia, unspecified: Secondary | ICD-10-CM | POA: Insufficient documentation

## 2016-09-18 MED ORDER — SODIUM CHLORIDE 0.9 % IV SOLN
INTRAVENOUS | Status: DC
  Administered 2016-09-18: 13:00:00 via INTRAVENOUS

## 2016-09-18 MED ORDER — SODIUM CHLORIDE 0.9 % IV SOLN
510.0000 mg | Freq: Once | INTRAVENOUS | Status: AC
  Administered 2016-09-18: 510 mg via INTRAVENOUS
  Filled 2016-09-18: qty 17

## 2016-09-19 DIAGNOSIS — N3000 Acute cystitis without hematuria: Secondary | ICD-10-CM | POA: Diagnosis not present

## 2016-09-19 DIAGNOSIS — D649 Anemia, unspecified: Secondary | ICD-10-CM | POA: Diagnosis not present

## 2016-09-19 DIAGNOSIS — E785 Hyperlipidemia, unspecified: Secondary | ICD-10-CM | POA: Diagnosis not present

## 2016-09-19 DIAGNOSIS — I10 Essential (primary) hypertension: Secondary | ICD-10-CM | POA: Diagnosis not present

## 2016-09-19 DIAGNOSIS — H409 Unspecified glaucoma: Secondary | ICD-10-CM | POA: Diagnosis not present

## 2016-09-19 DIAGNOSIS — I69351 Hemiplegia and hemiparesis following cerebral infarction affecting right dominant side: Secondary | ICD-10-CM | POA: Diagnosis not present

## 2016-09-20 DIAGNOSIS — N3000 Acute cystitis without hematuria: Secondary | ICD-10-CM | POA: Diagnosis not present

## 2016-09-20 DIAGNOSIS — H409 Unspecified glaucoma: Secondary | ICD-10-CM | POA: Diagnosis not present

## 2016-09-20 DIAGNOSIS — E785 Hyperlipidemia, unspecified: Secondary | ICD-10-CM | POA: Diagnosis not present

## 2016-09-20 DIAGNOSIS — I10 Essential (primary) hypertension: Secondary | ICD-10-CM | POA: Diagnosis not present

## 2016-09-20 DIAGNOSIS — I69351 Hemiplegia and hemiparesis following cerebral infarction affecting right dominant side: Secondary | ICD-10-CM | POA: Diagnosis not present

## 2016-09-20 DIAGNOSIS — D649 Anemia, unspecified: Secondary | ICD-10-CM | POA: Diagnosis not present

## 2016-09-23 DIAGNOSIS — E785 Hyperlipidemia, unspecified: Secondary | ICD-10-CM | POA: Diagnosis not present

## 2016-09-23 DIAGNOSIS — H409 Unspecified glaucoma: Secondary | ICD-10-CM | POA: Diagnosis not present

## 2016-09-23 DIAGNOSIS — D649 Anemia, unspecified: Secondary | ICD-10-CM | POA: Diagnosis not present

## 2016-09-23 DIAGNOSIS — N3000 Acute cystitis without hematuria: Secondary | ICD-10-CM | POA: Diagnosis not present

## 2016-09-23 DIAGNOSIS — I69351 Hemiplegia and hemiparesis following cerebral infarction affecting right dominant side: Secondary | ICD-10-CM | POA: Diagnosis not present

## 2016-09-23 DIAGNOSIS — I10 Essential (primary) hypertension: Secondary | ICD-10-CM | POA: Diagnosis not present

## 2016-09-25 DIAGNOSIS — H409 Unspecified glaucoma: Secondary | ICD-10-CM | POA: Diagnosis not present

## 2016-09-25 DIAGNOSIS — I739 Peripheral vascular disease, unspecified: Secondary | ICD-10-CM | POA: Diagnosis not present

## 2016-09-25 DIAGNOSIS — N3000 Acute cystitis without hematuria: Secondary | ICD-10-CM | POA: Diagnosis not present

## 2016-09-25 DIAGNOSIS — M79672 Pain in left foot: Secondary | ICD-10-CM | POA: Diagnosis not present

## 2016-09-25 DIAGNOSIS — D649 Anemia, unspecified: Secondary | ICD-10-CM | POA: Diagnosis not present

## 2016-09-25 DIAGNOSIS — B351 Tinea unguium: Secondary | ICD-10-CM | POA: Diagnosis not present

## 2016-09-25 DIAGNOSIS — M79671 Pain in right foot: Secondary | ICD-10-CM | POA: Diagnosis not present

## 2016-09-25 DIAGNOSIS — I10 Essential (primary) hypertension: Secondary | ICD-10-CM | POA: Diagnosis not present

## 2016-09-25 DIAGNOSIS — E7800 Pure hypercholesterolemia, unspecified: Secondary | ICD-10-CM | POA: Diagnosis not present

## 2016-09-25 DIAGNOSIS — I639 Cerebral infarction, unspecified: Secondary | ICD-10-CM | POA: Diagnosis not present

## 2016-09-25 DIAGNOSIS — I69351 Hemiplegia and hemiparesis following cerebral infarction affecting right dominant side: Secondary | ICD-10-CM | POA: Diagnosis not present

## 2016-09-25 DIAGNOSIS — E785 Hyperlipidemia, unspecified: Secondary | ICD-10-CM | POA: Diagnosis not present

## 2016-09-26 DIAGNOSIS — H409 Unspecified glaucoma: Secondary | ICD-10-CM | POA: Diagnosis not present

## 2016-09-26 DIAGNOSIS — E785 Hyperlipidemia, unspecified: Secondary | ICD-10-CM | POA: Diagnosis not present

## 2016-09-26 DIAGNOSIS — I10 Essential (primary) hypertension: Secondary | ICD-10-CM | POA: Diagnosis not present

## 2016-09-26 DIAGNOSIS — D649 Anemia, unspecified: Secondary | ICD-10-CM | POA: Diagnosis not present

## 2016-09-26 DIAGNOSIS — I69351 Hemiplegia and hemiparesis following cerebral infarction affecting right dominant side: Secondary | ICD-10-CM | POA: Diagnosis not present

## 2016-09-26 DIAGNOSIS — N3000 Acute cystitis without hematuria: Secondary | ICD-10-CM | POA: Diagnosis not present

## 2016-09-27 DIAGNOSIS — I10 Essential (primary) hypertension: Secondary | ICD-10-CM | POA: Diagnosis not present

## 2016-09-27 DIAGNOSIS — E785 Hyperlipidemia, unspecified: Secondary | ICD-10-CM | POA: Diagnosis not present

## 2016-09-27 DIAGNOSIS — I69351 Hemiplegia and hemiparesis following cerebral infarction affecting right dominant side: Secondary | ICD-10-CM | POA: Diagnosis not present

## 2016-09-27 DIAGNOSIS — H409 Unspecified glaucoma: Secondary | ICD-10-CM | POA: Diagnosis not present

## 2016-09-27 DIAGNOSIS — D649 Anemia, unspecified: Secondary | ICD-10-CM | POA: Diagnosis not present

## 2016-09-27 DIAGNOSIS — N3000 Acute cystitis without hematuria: Secondary | ICD-10-CM | POA: Diagnosis not present

## 2016-09-30 DIAGNOSIS — E785 Hyperlipidemia, unspecified: Secondary | ICD-10-CM | POA: Diagnosis not present

## 2016-09-30 DIAGNOSIS — D649 Anemia, unspecified: Secondary | ICD-10-CM | POA: Diagnosis not present

## 2016-09-30 DIAGNOSIS — I69351 Hemiplegia and hemiparesis following cerebral infarction affecting right dominant side: Secondary | ICD-10-CM | POA: Diagnosis not present

## 2016-09-30 DIAGNOSIS — I10 Essential (primary) hypertension: Secondary | ICD-10-CM | POA: Diagnosis not present

## 2016-09-30 DIAGNOSIS — N3000 Acute cystitis without hematuria: Secondary | ICD-10-CM | POA: Diagnosis not present

## 2016-09-30 DIAGNOSIS — H409 Unspecified glaucoma: Secondary | ICD-10-CM | POA: Diagnosis not present

## 2016-10-02 DIAGNOSIS — Z4682 Encounter for fitting and adjustment of non-vascular catheter: Secondary | ICD-10-CM | POA: Diagnosis not present

## 2016-10-02 DIAGNOSIS — Z7901 Long term (current) use of anticoagulants: Secondary | ICD-10-CM | POA: Diagnosis not present

## 2016-10-02 DIAGNOSIS — N319 Neuromuscular dysfunction of bladder, unspecified: Secondary | ICD-10-CM | POA: Diagnosis not present

## 2016-10-04 DIAGNOSIS — N3000 Acute cystitis without hematuria: Secondary | ICD-10-CM | POA: Diagnosis not present

## 2016-10-04 DIAGNOSIS — D649 Anemia, unspecified: Secondary | ICD-10-CM | POA: Diagnosis not present

## 2016-10-04 DIAGNOSIS — I69351 Hemiplegia and hemiparesis following cerebral infarction affecting right dominant side: Secondary | ICD-10-CM | POA: Diagnosis not present

## 2016-10-04 DIAGNOSIS — I10 Essential (primary) hypertension: Secondary | ICD-10-CM | POA: Diagnosis not present

## 2016-10-04 DIAGNOSIS — E785 Hyperlipidemia, unspecified: Secondary | ICD-10-CM | POA: Diagnosis not present

## 2016-10-04 DIAGNOSIS — H409 Unspecified glaucoma: Secondary | ICD-10-CM | POA: Diagnosis not present

## 2016-10-07 DIAGNOSIS — Z86718 Personal history of other venous thrombosis and embolism: Secondary | ICD-10-CM | POA: Diagnosis not present

## 2016-10-07 DIAGNOSIS — I129 Hypertensive chronic kidney disease with stage 1 through stage 4 chronic kidney disease, or unspecified chronic kidney disease: Secondary | ICD-10-CM | POA: Diagnosis not present

## 2016-10-07 DIAGNOSIS — Z936 Other artificial openings of urinary tract status: Secondary | ICD-10-CM | POA: Diagnosis not present

## 2016-10-07 DIAGNOSIS — I69351 Hemiplegia and hemiparesis following cerebral infarction affecting right dominant side: Secondary | ICD-10-CM | POA: Diagnosis not present

## 2016-10-07 DIAGNOSIS — H409 Unspecified glaucoma: Secondary | ICD-10-CM | POA: Diagnosis not present

## 2016-10-07 DIAGNOSIS — N189 Chronic kidney disease, unspecified: Secondary | ICD-10-CM | POA: Diagnosis not present

## 2016-10-07 DIAGNOSIS — E785 Hyperlipidemia, unspecified: Secondary | ICD-10-CM | POA: Diagnosis not present

## 2016-10-07 DIAGNOSIS — Z7901 Long term (current) use of anticoagulants: Secondary | ICD-10-CM | POA: Diagnosis not present

## 2016-10-07 DIAGNOSIS — D649 Anemia, unspecified: Secondary | ICD-10-CM | POA: Diagnosis not present

## 2016-10-07 DIAGNOSIS — Z87440 Personal history of urinary (tract) infections: Secondary | ICD-10-CM | POA: Diagnosis not present

## 2016-10-09 DIAGNOSIS — Z936 Other artificial openings of urinary tract status: Secondary | ICD-10-CM | POA: Diagnosis not present

## 2016-10-09 DIAGNOSIS — Z87440 Personal history of urinary (tract) infections: Secondary | ICD-10-CM | POA: Diagnosis not present

## 2016-10-09 DIAGNOSIS — I129 Hypertensive chronic kidney disease with stage 1 through stage 4 chronic kidney disease, or unspecified chronic kidney disease: Secondary | ICD-10-CM | POA: Diagnosis not present

## 2016-10-09 DIAGNOSIS — I69351 Hemiplegia and hemiparesis following cerebral infarction affecting right dominant side: Secondary | ICD-10-CM | POA: Diagnosis not present

## 2016-10-09 DIAGNOSIS — E785 Hyperlipidemia, unspecified: Secondary | ICD-10-CM | POA: Diagnosis not present

## 2016-10-09 DIAGNOSIS — N189 Chronic kidney disease, unspecified: Secondary | ICD-10-CM | POA: Diagnosis not present

## 2016-10-14 DIAGNOSIS — Z936 Other artificial openings of urinary tract status: Secondary | ICD-10-CM | POA: Diagnosis not present

## 2016-10-14 DIAGNOSIS — E785 Hyperlipidemia, unspecified: Secondary | ICD-10-CM | POA: Diagnosis not present

## 2016-10-14 DIAGNOSIS — N189 Chronic kidney disease, unspecified: Secondary | ICD-10-CM | POA: Diagnosis not present

## 2016-10-14 DIAGNOSIS — I69351 Hemiplegia and hemiparesis following cerebral infarction affecting right dominant side: Secondary | ICD-10-CM | POA: Diagnosis not present

## 2016-10-14 DIAGNOSIS — Z87440 Personal history of urinary (tract) infections: Secondary | ICD-10-CM | POA: Diagnosis not present

## 2016-10-14 DIAGNOSIS — I129 Hypertensive chronic kidney disease with stage 1 through stage 4 chronic kidney disease, or unspecified chronic kidney disease: Secondary | ICD-10-CM | POA: Diagnosis not present

## 2016-10-17 DIAGNOSIS — I129 Hypertensive chronic kidney disease with stage 1 through stage 4 chronic kidney disease, or unspecified chronic kidney disease: Secondary | ICD-10-CM | POA: Diagnosis not present

## 2016-10-17 DIAGNOSIS — N189 Chronic kidney disease, unspecified: Secondary | ICD-10-CM | POA: Diagnosis not present

## 2016-10-17 DIAGNOSIS — I69351 Hemiplegia and hemiparesis following cerebral infarction affecting right dominant side: Secondary | ICD-10-CM | POA: Diagnosis not present

## 2016-10-17 DIAGNOSIS — Z87440 Personal history of urinary (tract) infections: Secondary | ICD-10-CM | POA: Diagnosis not present

## 2016-10-17 DIAGNOSIS — E785 Hyperlipidemia, unspecified: Secondary | ICD-10-CM | POA: Diagnosis not present

## 2016-10-17 DIAGNOSIS — Z936 Other artificial openings of urinary tract status: Secondary | ICD-10-CM | POA: Diagnosis not present

## 2016-10-21 DIAGNOSIS — I129 Hypertensive chronic kidney disease with stage 1 through stage 4 chronic kidney disease, or unspecified chronic kidney disease: Secondary | ICD-10-CM | POA: Diagnosis not present

## 2016-10-21 DIAGNOSIS — E785 Hyperlipidemia, unspecified: Secondary | ICD-10-CM | POA: Diagnosis not present

## 2016-10-21 DIAGNOSIS — I69351 Hemiplegia and hemiparesis following cerebral infarction affecting right dominant side: Secondary | ICD-10-CM | POA: Diagnosis not present

## 2016-10-21 DIAGNOSIS — Z936 Other artificial openings of urinary tract status: Secondary | ICD-10-CM | POA: Diagnosis not present

## 2016-10-21 DIAGNOSIS — N189 Chronic kidney disease, unspecified: Secondary | ICD-10-CM | POA: Diagnosis not present

## 2016-10-21 DIAGNOSIS — Z87440 Personal history of urinary (tract) infections: Secondary | ICD-10-CM | POA: Diagnosis not present

## 2016-10-23 DIAGNOSIS — N189 Chronic kidney disease, unspecified: Secondary | ICD-10-CM | POA: Diagnosis not present

## 2016-10-23 DIAGNOSIS — Z87440 Personal history of urinary (tract) infections: Secondary | ICD-10-CM | POA: Diagnosis not present

## 2016-10-23 DIAGNOSIS — I69351 Hemiplegia and hemiparesis following cerebral infarction affecting right dominant side: Secondary | ICD-10-CM | POA: Diagnosis not present

## 2016-10-23 DIAGNOSIS — I129 Hypertensive chronic kidney disease with stage 1 through stage 4 chronic kidney disease, or unspecified chronic kidney disease: Secondary | ICD-10-CM | POA: Diagnosis not present

## 2016-10-23 DIAGNOSIS — Z936 Other artificial openings of urinary tract status: Secondary | ICD-10-CM | POA: Diagnosis not present

## 2016-10-23 DIAGNOSIS — E785 Hyperlipidemia, unspecified: Secondary | ICD-10-CM | POA: Diagnosis not present

## 2016-10-24 DIAGNOSIS — I82409 Acute embolism and thrombosis of unspecified deep veins of unspecified lower extremity: Secondary | ICD-10-CM | POA: Diagnosis not present

## 2016-10-24 DIAGNOSIS — I10 Essential (primary) hypertension: Secondary | ICD-10-CM | POA: Diagnosis not present

## 2016-10-24 DIAGNOSIS — M1710 Unilateral primary osteoarthritis, unspecified knee: Secondary | ICD-10-CM | POA: Diagnosis not present

## 2016-10-28 DIAGNOSIS — N319 Neuromuscular dysfunction of bladder, unspecified: Secondary | ICD-10-CM | POA: Diagnosis not present

## 2016-10-28 DIAGNOSIS — R339 Retention of urine, unspecified: Secondary | ICD-10-CM | POA: Diagnosis not present

## 2016-11-18 DIAGNOSIS — E7800 Pure hypercholesterolemia, unspecified: Secondary | ICD-10-CM | POA: Diagnosis not present

## 2016-11-18 DIAGNOSIS — I10 Essential (primary) hypertension: Secondary | ICD-10-CM | POA: Diagnosis not present

## 2016-11-18 DIAGNOSIS — I639 Cerebral infarction, unspecified: Secondary | ICD-10-CM | POA: Diagnosis not present

## 2016-11-20 DIAGNOSIS — I69951 Hemiplegia and hemiparesis following unspecified cerebrovascular disease affecting right dominant side: Secondary | ICD-10-CM | POA: Diagnosis not present

## 2016-11-20 DIAGNOSIS — M6281 Muscle weakness (generalized): Secondary | ICD-10-CM | POA: Diagnosis not present

## 2016-11-20 DIAGNOSIS — R2689 Other abnormalities of gait and mobility: Secondary | ICD-10-CM | POA: Diagnosis not present

## 2016-11-21 DIAGNOSIS — Z299 Encounter for prophylactic measures, unspecified: Secondary | ICD-10-CM | POA: Diagnosis not present

## 2016-11-21 DIAGNOSIS — I10 Essential (primary) hypertension: Secondary | ICD-10-CM | POA: Diagnosis not present

## 2016-11-21 DIAGNOSIS — I82409 Acute embolism and thrombosis of unspecified deep veins of unspecified lower extremity: Secondary | ICD-10-CM | POA: Diagnosis not present

## 2016-11-21 DIAGNOSIS — G4700 Insomnia, unspecified: Secondary | ICD-10-CM | POA: Diagnosis not present

## 2016-11-22 DIAGNOSIS — R2689 Other abnormalities of gait and mobility: Secondary | ICD-10-CM | POA: Diagnosis not present

## 2016-11-22 DIAGNOSIS — I69951 Hemiplegia and hemiparesis following unspecified cerebrovascular disease affecting right dominant side: Secondary | ICD-10-CM | POA: Diagnosis not present

## 2016-11-22 DIAGNOSIS — M6281 Muscle weakness (generalized): Secondary | ICD-10-CM | POA: Diagnosis not present

## 2016-11-26 DIAGNOSIS — R339 Retention of urine, unspecified: Secondary | ICD-10-CM | POA: Diagnosis not present

## 2016-11-26 DIAGNOSIS — N319 Neuromuscular dysfunction of bladder, unspecified: Secondary | ICD-10-CM | POA: Diagnosis not present

## 2016-11-29 DIAGNOSIS — M6281 Muscle weakness (generalized): Secondary | ICD-10-CM | POA: Diagnosis not present

## 2016-11-29 DIAGNOSIS — I69951 Hemiplegia and hemiparesis following unspecified cerebrovascular disease affecting right dominant side: Secondary | ICD-10-CM | POA: Diagnosis not present

## 2016-11-29 DIAGNOSIS — R2689 Other abnormalities of gait and mobility: Secondary | ICD-10-CM | POA: Diagnosis not present

## 2016-12-02 DIAGNOSIS — R2689 Other abnormalities of gait and mobility: Secondary | ICD-10-CM | POA: Diagnosis not present

## 2016-12-02 DIAGNOSIS — M6281 Muscle weakness (generalized): Secondary | ICD-10-CM | POA: Diagnosis not present

## 2016-12-02 DIAGNOSIS — I69951 Hemiplegia and hemiparesis following unspecified cerebrovascular disease affecting right dominant side: Secondary | ICD-10-CM | POA: Diagnosis not present

## 2016-12-05 DIAGNOSIS — R2689 Other abnormalities of gait and mobility: Secondary | ICD-10-CM | POA: Diagnosis not present

## 2016-12-05 DIAGNOSIS — I69951 Hemiplegia and hemiparesis following unspecified cerebrovascular disease affecting right dominant side: Secondary | ICD-10-CM | POA: Diagnosis not present

## 2016-12-05 DIAGNOSIS — M6281 Muscle weakness (generalized): Secondary | ICD-10-CM | POA: Diagnosis not present

## 2016-12-11 DIAGNOSIS — Z9049 Acquired absence of other specified parts of digestive tract: Secondary | ICD-10-CM | POA: Diagnosis not present

## 2016-12-11 DIAGNOSIS — Z7952 Long term (current) use of systemic steroids: Secondary | ICD-10-CM | POA: Diagnosis not present

## 2016-12-11 DIAGNOSIS — Z87440 Personal history of urinary (tract) infections: Secondary | ICD-10-CM | POA: Diagnosis not present

## 2016-12-11 DIAGNOSIS — Z888 Allergy status to other drugs, medicaments and biological substances status: Secondary | ICD-10-CM | POA: Diagnosis not present

## 2016-12-11 DIAGNOSIS — Z79899 Other long term (current) drug therapy: Secondary | ICD-10-CM | POA: Diagnosis not present

## 2016-12-11 DIAGNOSIS — Z8673 Personal history of transient ischemic attack (TIA), and cerebral infarction without residual deficits: Secondary | ICD-10-CM | POA: Diagnosis not present

## 2016-12-11 DIAGNOSIS — E785 Hyperlipidemia, unspecified: Secondary | ICD-10-CM | POA: Diagnosis not present

## 2016-12-11 DIAGNOSIS — I10 Essential (primary) hypertension: Secondary | ICD-10-CM | POA: Diagnosis not present

## 2016-12-11 DIAGNOSIS — N319 Neuromuscular dysfunction of bladder, unspecified: Secondary | ICD-10-CM | POA: Diagnosis not present

## 2016-12-11 DIAGNOSIS — Z91013 Allergy to seafood: Secondary | ICD-10-CM | POA: Diagnosis not present

## 2016-12-11 DIAGNOSIS — R339 Retention of urine, unspecified: Secondary | ICD-10-CM | POA: Diagnosis not present

## 2016-12-11 DIAGNOSIS — Z7901 Long term (current) use of anticoagulants: Secondary | ICD-10-CM | POA: Diagnosis not present

## 2016-12-11 DIAGNOSIS — E0590 Thyrotoxicosis, unspecified without thyrotoxic crisis or storm: Secondary | ICD-10-CM | POA: Diagnosis not present

## 2016-12-11 DIAGNOSIS — Z90710 Acquired absence of both cervix and uterus: Secondary | ICD-10-CM | POA: Diagnosis not present

## 2016-12-11 DIAGNOSIS — Z883 Allergy status to other anti-infective agents status: Secondary | ICD-10-CM | POA: Diagnosis not present

## 2016-12-11 DIAGNOSIS — Z86718 Personal history of other venous thrombosis and embolism: Secondary | ICD-10-CM | POA: Diagnosis not present

## 2016-12-16 DIAGNOSIS — Z299 Encounter for prophylactic measures, unspecified: Secondary | ICD-10-CM | POA: Diagnosis not present

## 2016-12-16 DIAGNOSIS — I639 Cerebral infarction, unspecified: Secondary | ICD-10-CM | POA: Diagnosis not present

## 2016-12-16 DIAGNOSIS — M1710 Unilateral primary osteoarthritis, unspecified knee: Secondary | ICD-10-CM | POA: Diagnosis not present

## 2016-12-16 DIAGNOSIS — I82409 Acute embolism and thrombosis of unspecified deep veins of unspecified lower extremity: Secondary | ICD-10-CM | POA: Diagnosis not present

## 2016-12-16 DIAGNOSIS — E7800 Pure hypercholesterolemia, unspecified: Secondary | ICD-10-CM | POA: Diagnosis not present

## 2016-12-18 DIAGNOSIS — M6281 Muscle weakness (generalized): Secondary | ICD-10-CM | POA: Diagnosis not present

## 2016-12-18 DIAGNOSIS — R2689 Other abnormalities of gait and mobility: Secondary | ICD-10-CM | POA: Diagnosis not present

## 2016-12-18 DIAGNOSIS — I69951 Hemiplegia and hemiparesis following unspecified cerebrovascular disease affecting right dominant side: Secondary | ICD-10-CM | POA: Diagnosis not present

## 2016-12-19 DIAGNOSIS — R2689 Other abnormalities of gait and mobility: Secondary | ICD-10-CM | POA: Diagnosis not present

## 2016-12-19 DIAGNOSIS — M6281 Muscle weakness (generalized): Secondary | ICD-10-CM | POA: Diagnosis not present

## 2016-12-19 DIAGNOSIS — I69951 Hemiplegia and hemiparesis following unspecified cerebrovascular disease affecting right dominant side: Secondary | ICD-10-CM | POA: Diagnosis not present

## 2016-12-20 DIAGNOSIS — I69951 Hemiplegia and hemiparesis following unspecified cerebrovascular disease affecting right dominant side: Secondary | ICD-10-CM | POA: Diagnosis not present

## 2016-12-20 DIAGNOSIS — R2689 Other abnormalities of gait and mobility: Secondary | ICD-10-CM | POA: Diagnosis not present

## 2016-12-20 DIAGNOSIS — M6281 Muscle weakness (generalized): Secondary | ICD-10-CM | POA: Diagnosis not present

## 2016-12-25 DIAGNOSIS — I69951 Hemiplegia and hemiparesis following unspecified cerebrovascular disease affecting right dominant side: Secondary | ICD-10-CM | POA: Diagnosis not present

## 2016-12-25 DIAGNOSIS — R2689 Other abnormalities of gait and mobility: Secondary | ICD-10-CM | POA: Diagnosis not present

## 2016-12-25 DIAGNOSIS — M6281 Muscle weakness (generalized): Secondary | ICD-10-CM | POA: Diagnosis not present

## 2017-01-01 DIAGNOSIS — R2689 Other abnormalities of gait and mobility: Secondary | ICD-10-CM | POA: Diagnosis not present

## 2017-01-01 DIAGNOSIS — M6281 Muscle weakness (generalized): Secondary | ICD-10-CM | POA: Diagnosis not present

## 2017-01-01 DIAGNOSIS — I69951 Hemiplegia and hemiparesis following unspecified cerebrovascular disease affecting right dominant side: Secondary | ICD-10-CM | POA: Diagnosis not present

## 2017-01-03 DIAGNOSIS — M6281 Muscle weakness (generalized): Secondary | ICD-10-CM | POA: Diagnosis not present

## 2017-01-03 DIAGNOSIS — I69951 Hemiplegia and hemiparesis following unspecified cerebrovascular disease affecting right dominant side: Secondary | ICD-10-CM | POA: Diagnosis not present

## 2017-01-03 DIAGNOSIS — R2689 Other abnormalities of gait and mobility: Secondary | ICD-10-CM | POA: Diagnosis not present

## 2017-01-08 DIAGNOSIS — M6281 Muscle weakness (generalized): Secondary | ICD-10-CM | POA: Diagnosis not present

## 2017-01-08 DIAGNOSIS — R2689 Other abnormalities of gait and mobility: Secondary | ICD-10-CM | POA: Diagnosis not present

## 2017-01-08 DIAGNOSIS — I69951 Hemiplegia and hemiparesis following unspecified cerebrovascular disease affecting right dominant side: Secondary | ICD-10-CM | POA: Diagnosis not present

## 2017-01-10 DIAGNOSIS — I69951 Hemiplegia and hemiparesis following unspecified cerebrovascular disease affecting right dominant side: Secondary | ICD-10-CM | POA: Diagnosis not present

## 2017-01-10 DIAGNOSIS — R2689 Other abnormalities of gait and mobility: Secondary | ICD-10-CM | POA: Diagnosis not present

## 2017-01-10 DIAGNOSIS — M6281 Muscle weakness (generalized): Secondary | ICD-10-CM | POA: Diagnosis not present

## 2017-01-14 DIAGNOSIS — I69951 Hemiplegia and hemiparesis following unspecified cerebrovascular disease affecting right dominant side: Secondary | ICD-10-CM | POA: Diagnosis not present

## 2017-01-14 DIAGNOSIS — M6281 Muscle weakness (generalized): Secondary | ICD-10-CM | POA: Diagnosis not present

## 2017-01-14 DIAGNOSIS — R2689 Other abnormalities of gait and mobility: Secondary | ICD-10-CM | POA: Diagnosis not present

## 2017-01-15 ENCOUNTER — Ambulatory Visit (INDEPENDENT_AMBULATORY_CARE_PROVIDER_SITE_OTHER): Payer: Medicare Other | Admitting: Urology

## 2017-01-15 DIAGNOSIS — R339 Retention of urine, unspecified: Secondary | ICD-10-CM

## 2017-01-17 DIAGNOSIS — I69951 Hemiplegia and hemiparesis following unspecified cerebrovascular disease affecting right dominant side: Secondary | ICD-10-CM | POA: Diagnosis not present

## 2017-01-17 DIAGNOSIS — R2689 Other abnormalities of gait and mobility: Secondary | ICD-10-CM | POA: Diagnosis not present

## 2017-01-17 DIAGNOSIS — M6281 Muscle weakness (generalized): Secondary | ICD-10-CM | POA: Diagnosis not present

## 2017-01-21 DIAGNOSIS — Z299 Encounter for prophylactic measures, unspecified: Secondary | ICD-10-CM | POA: Diagnosis not present

## 2017-01-21 DIAGNOSIS — G4700 Insomnia, unspecified: Secondary | ICD-10-CM | POA: Diagnosis not present

## 2017-01-21 DIAGNOSIS — I82409 Acute embolism and thrombosis of unspecified deep veins of unspecified lower extremity: Secondary | ICD-10-CM | POA: Diagnosis not present

## 2017-01-21 DIAGNOSIS — I10 Essential (primary) hypertension: Secondary | ICD-10-CM | POA: Diagnosis not present

## 2017-01-22 DIAGNOSIS — I739 Peripheral vascular disease, unspecified: Secondary | ICD-10-CM | POA: Diagnosis not present

## 2017-01-22 DIAGNOSIS — M6281 Muscle weakness (generalized): Secondary | ICD-10-CM | POA: Diagnosis not present

## 2017-01-22 DIAGNOSIS — M79671 Pain in right foot: Secondary | ICD-10-CM | POA: Diagnosis not present

## 2017-01-22 DIAGNOSIS — R2689 Other abnormalities of gait and mobility: Secondary | ICD-10-CM | POA: Diagnosis not present

## 2017-01-22 DIAGNOSIS — I69951 Hemiplegia and hemiparesis following unspecified cerebrovascular disease affecting right dominant side: Secondary | ICD-10-CM | POA: Diagnosis not present

## 2017-01-22 DIAGNOSIS — B351 Tinea unguium: Secondary | ICD-10-CM | POA: Diagnosis not present

## 2017-01-22 DIAGNOSIS — M79672 Pain in left foot: Secondary | ICD-10-CM | POA: Diagnosis not present

## 2017-01-23 DIAGNOSIS — H04123 Dry eye syndrome of bilateral lacrimal glands: Secondary | ICD-10-CM | POA: Diagnosis not present

## 2017-01-23 DIAGNOSIS — Z961 Presence of intraocular lens: Secondary | ICD-10-CM | POA: Diagnosis not present

## 2017-01-23 DIAGNOSIS — H401123 Primary open-angle glaucoma, left eye, severe stage: Secondary | ICD-10-CM | POA: Diagnosis not present

## 2017-01-23 DIAGNOSIS — H401112 Primary open-angle glaucoma, right eye, moderate stage: Secondary | ICD-10-CM | POA: Diagnosis not present

## 2017-01-29 DIAGNOSIS — I69951 Hemiplegia and hemiparesis following unspecified cerebrovascular disease affecting right dominant side: Secondary | ICD-10-CM | POA: Diagnosis not present

## 2017-01-29 DIAGNOSIS — M6281 Muscle weakness (generalized): Secondary | ICD-10-CM | POA: Diagnosis not present

## 2017-01-29 DIAGNOSIS — R2689 Other abnormalities of gait and mobility: Secondary | ICD-10-CM | POA: Diagnosis not present

## 2017-01-31 DIAGNOSIS — R2689 Other abnormalities of gait and mobility: Secondary | ICD-10-CM | POA: Diagnosis not present

## 2017-01-31 DIAGNOSIS — M6281 Muscle weakness (generalized): Secondary | ICD-10-CM | POA: Diagnosis not present

## 2017-01-31 DIAGNOSIS — I69951 Hemiplegia and hemiparesis following unspecified cerebrovascular disease affecting right dominant side: Secondary | ICD-10-CM | POA: Diagnosis not present

## 2017-02-05 DIAGNOSIS — I10 Essential (primary) hypertension: Secondary | ICD-10-CM | POA: Diagnosis not present

## 2017-02-05 DIAGNOSIS — E0590 Thyrotoxicosis, unspecified without thyrotoxic crisis or storm: Secondary | ICD-10-CM | POA: Diagnosis not present

## 2017-02-05 DIAGNOSIS — Z86718 Personal history of other venous thrombosis and embolism: Secondary | ICD-10-CM | POA: Diagnosis not present

## 2017-02-05 DIAGNOSIS — S60445A External constriction of left ring finger, initial encounter: Secondary | ICD-10-CM | POA: Diagnosis not present

## 2017-02-05 DIAGNOSIS — I69351 Hemiplegia and hemiparesis following cerebral infarction affecting right dominant side: Secondary | ICD-10-CM | POA: Diagnosis not present

## 2017-02-05 DIAGNOSIS — Z7901 Long term (current) use of anticoagulants: Secondary | ICD-10-CM | POA: Diagnosis not present

## 2017-02-05 DIAGNOSIS — H409 Unspecified glaucoma: Secondary | ICD-10-CM | POA: Diagnosis not present

## 2017-02-05 DIAGNOSIS — Z79899 Other long term (current) drug therapy: Secondary | ICD-10-CM | POA: Diagnosis not present

## 2017-02-05 DIAGNOSIS — S60455A Superficial foreign body of left ring finger, initial encounter: Secondary | ICD-10-CM | POA: Diagnosis not present

## 2017-02-05 DIAGNOSIS — W4904XA Ring or other jewelry causing external constriction, initial encounter: Secondary | ICD-10-CM | POA: Diagnosis not present

## 2017-02-07 DIAGNOSIS — R2689 Other abnormalities of gait and mobility: Secondary | ICD-10-CM | POA: Diagnosis not present

## 2017-02-07 DIAGNOSIS — M6281 Muscle weakness (generalized): Secondary | ICD-10-CM | POA: Diagnosis not present

## 2017-02-07 DIAGNOSIS — I69951 Hemiplegia and hemiparesis following unspecified cerebrovascular disease affecting right dominant side: Secondary | ICD-10-CM | POA: Diagnosis not present

## 2017-02-14 DIAGNOSIS — I639 Cerebral infarction, unspecified: Secondary | ICD-10-CM | POA: Diagnosis not present

## 2017-02-14 DIAGNOSIS — Z9359 Other cystostomy status: Secondary | ICD-10-CM | POA: Diagnosis not present

## 2017-02-14 DIAGNOSIS — I82409 Acute embolism and thrombosis of unspecified deep veins of unspecified lower extremity: Secondary | ICD-10-CM | POA: Diagnosis not present

## 2017-02-14 DIAGNOSIS — T148XXA Other injury of unspecified body region, initial encounter: Secondary | ICD-10-CM | POA: Diagnosis not present

## 2017-02-14 DIAGNOSIS — D696 Thrombocytopenia, unspecified: Secondary | ICD-10-CM | POA: Diagnosis not present

## 2017-02-14 DIAGNOSIS — Z299 Encounter for prophylactic measures, unspecified: Secondary | ICD-10-CM | POA: Diagnosis not present

## 2017-02-18 DIAGNOSIS — Z299 Encounter for prophylactic measures, unspecified: Secondary | ICD-10-CM | POA: Diagnosis not present

## 2017-02-18 DIAGNOSIS — I639 Cerebral infarction, unspecified: Secondary | ICD-10-CM | POA: Diagnosis not present

## 2017-02-18 DIAGNOSIS — I82409 Acute embolism and thrombosis of unspecified deep veins of unspecified lower extremity: Secondary | ICD-10-CM | POA: Diagnosis not present

## 2017-02-18 DIAGNOSIS — Z713 Dietary counseling and surveillance: Secondary | ICD-10-CM | POA: Diagnosis not present

## 2017-02-18 DIAGNOSIS — Z6825 Body mass index (BMI) 25.0-25.9, adult: Secondary | ICD-10-CM | POA: Diagnosis not present

## 2017-02-25 ENCOUNTER — Ambulatory Visit (INDEPENDENT_AMBULATORY_CARE_PROVIDER_SITE_OTHER): Payer: Medicare Other | Admitting: Urology

## 2017-02-25 DIAGNOSIS — R339 Retention of urine, unspecified: Secondary | ICD-10-CM | POA: Diagnosis not present

## 2017-03-18 DIAGNOSIS — D696 Thrombocytopenia, unspecified: Secondary | ICD-10-CM | POA: Diagnosis not present

## 2017-03-18 DIAGNOSIS — Z9359 Other cystostomy status: Secondary | ICD-10-CM | POA: Diagnosis not present

## 2017-03-18 DIAGNOSIS — Z299 Encounter for prophylactic measures, unspecified: Secondary | ICD-10-CM | POA: Diagnosis not present

## 2017-03-18 DIAGNOSIS — I82409 Acute embolism and thrombosis of unspecified deep veins of unspecified lower extremity: Secondary | ICD-10-CM | POA: Diagnosis not present

## 2017-03-18 DIAGNOSIS — I10 Essential (primary) hypertension: Secondary | ICD-10-CM | POA: Diagnosis not present

## 2017-03-18 DIAGNOSIS — K219 Gastro-esophageal reflux disease without esophagitis: Secondary | ICD-10-CM | POA: Diagnosis not present

## 2017-03-18 DIAGNOSIS — M1710 Unilateral primary osteoarthritis, unspecified knee: Secondary | ICD-10-CM | POA: Diagnosis not present

## 2017-03-18 DIAGNOSIS — I639 Cerebral infarction, unspecified: Secondary | ICD-10-CM | POA: Diagnosis not present

## 2017-03-25 ENCOUNTER — Ambulatory Visit (INDEPENDENT_AMBULATORY_CARE_PROVIDER_SITE_OTHER): Payer: Medicare Other | Admitting: Urology

## 2017-03-25 DIAGNOSIS — R339 Retention of urine, unspecified: Secondary | ICD-10-CM

## 2017-04-17 DIAGNOSIS — I82409 Acute embolism and thrombosis of unspecified deep veins of unspecified lower extremity: Secondary | ICD-10-CM | POA: Diagnosis not present

## 2017-04-17 DIAGNOSIS — Z9359 Other cystostomy status: Secondary | ICD-10-CM | POA: Diagnosis not present

## 2017-04-17 DIAGNOSIS — Z1389 Encounter for screening for other disorder: Secondary | ICD-10-CM | POA: Diagnosis not present

## 2017-04-17 DIAGNOSIS — Z0000 Encounter for general adult medical examination without abnormal findings: Secondary | ICD-10-CM | POA: Diagnosis not present

## 2017-04-17 DIAGNOSIS — D696 Thrombocytopenia, unspecified: Secondary | ICD-10-CM | POA: Diagnosis not present

## 2017-04-17 DIAGNOSIS — E7800 Pure hypercholesterolemia, unspecified: Secondary | ICD-10-CM | POA: Diagnosis not present

## 2017-04-17 DIAGNOSIS — Z299 Encounter for prophylactic measures, unspecified: Secondary | ICD-10-CM | POA: Diagnosis not present

## 2017-04-17 DIAGNOSIS — I10 Essential (primary) hypertension: Secondary | ICD-10-CM | POA: Diagnosis not present

## 2017-04-17 DIAGNOSIS — M1710 Unilateral primary osteoarthritis, unspecified knee: Secondary | ICD-10-CM | POA: Diagnosis not present

## 2017-04-17 DIAGNOSIS — E039 Hypothyroidism, unspecified: Secondary | ICD-10-CM | POA: Diagnosis not present

## 2017-04-17 DIAGNOSIS — Z7189 Other specified counseling: Secondary | ICD-10-CM | POA: Diagnosis not present

## 2017-04-17 DIAGNOSIS — R5383 Other fatigue: Secondary | ICD-10-CM | POA: Diagnosis not present

## 2017-04-18 DIAGNOSIS — R5383 Other fatigue: Secondary | ICD-10-CM | POA: Diagnosis not present

## 2017-04-18 DIAGNOSIS — Z79899 Other long term (current) drug therapy: Secondary | ICD-10-CM | POA: Diagnosis not present

## 2017-04-18 DIAGNOSIS — E7800 Pure hypercholesterolemia, unspecified: Secondary | ICD-10-CM | POA: Diagnosis not present

## 2017-04-18 DIAGNOSIS — E039 Hypothyroidism, unspecified: Secondary | ICD-10-CM | POA: Diagnosis not present

## 2017-04-22 ENCOUNTER — Ambulatory Visit (INDEPENDENT_AMBULATORY_CARE_PROVIDER_SITE_OTHER): Payer: Medicare Other | Admitting: Urology

## 2017-04-22 DIAGNOSIS — R339 Retention of urine, unspecified: Secondary | ICD-10-CM | POA: Diagnosis not present

## 2017-04-30 DIAGNOSIS — I739 Peripheral vascular disease, unspecified: Secondary | ICD-10-CM | POA: Diagnosis not present

## 2017-04-30 DIAGNOSIS — M79672 Pain in left foot: Secondary | ICD-10-CM | POA: Diagnosis not present

## 2017-04-30 DIAGNOSIS — M79671 Pain in right foot: Secondary | ICD-10-CM | POA: Diagnosis not present

## 2017-04-30 DIAGNOSIS — B351 Tinea unguium: Secondary | ICD-10-CM | POA: Diagnosis not present

## 2017-05-15 DIAGNOSIS — E039 Hypothyroidism, unspecified: Secondary | ICD-10-CM | POA: Diagnosis not present

## 2017-05-15 DIAGNOSIS — F419 Anxiety disorder, unspecified: Secondary | ICD-10-CM | POA: Diagnosis not present

## 2017-05-15 DIAGNOSIS — D696 Thrombocytopenia, unspecified: Secondary | ICD-10-CM | POA: Diagnosis not present

## 2017-05-15 DIAGNOSIS — I82409 Acute embolism and thrombosis of unspecified deep veins of unspecified lower extremity: Secondary | ICD-10-CM | POA: Diagnosis not present

## 2017-05-15 DIAGNOSIS — I10 Essential (primary) hypertension: Secondary | ICD-10-CM | POA: Diagnosis not present

## 2017-05-15 DIAGNOSIS — Z713 Dietary counseling and surveillance: Secondary | ICD-10-CM | POA: Diagnosis not present

## 2017-05-15 DIAGNOSIS — I639 Cerebral infarction, unspecified: Secondary | ICD-10-CM | POA: Diagnosis not present

## 2017-05-15 DIAGNOSIS — Z299 Encounter for prophylactic measures, unspecified: Secondary | ICD-10-CM | POA: Diagnosis not present

## 2017-05-20 ENCOUNTER — Ambulatory Visit (INDEPENDENT_AMBULATORY_CARE_PROVIDER_SITE_OTHER): Payer: Medicare Other | Admitting: Urology

## 2017-05-20 DIAGNOSIS — R339 Retention of urine, unspecified: Secondary | ICD-10-CM | POA: Diagnosis not present

## 2017-05-22 DIAGNOSIS — R339 Retention of urine, unspecified: Secondary | ICD-10-CM | POA: Diagnosis not present

## 2017-05-28 ENCOUNTER — Ambulatory Visit (INDEPENDENT_AMBULATORY_CARE_PROVIDER_SITE_OTHER): Payer: Medicare Other | Admitting: Urology

## 2017-05-28 DIAGNOSIS — R350 Frequency of micturition: Secondary | ICD-10-CM | POA: Diagnosis not present

## 2017-05-28 DIAGNOSIS — R3915 Urgency of urination: Secondary | ICD-10-CM

## 2017-05-30 DIAGNOSIS — N390 Urinary tract infection, site not specified: Secondary | ICD-10-CM | POA: Diagnosis not present

## 2017-05-30 DIAGNOSIS — D72829 Elevated white blood cell count, unspecified: Secondary | ICD-10-CM | POA: Diagnosis not present

## 2017-05-30 DIAGNOSIS — R319 Hematuria, unspecified: Secondary | ICD-10-CM | POA: Diagnosis not present

## 2017-05-30 DIAGNOSIS — Z7901 Long term (current) use of anticoagulants: Secondary | ICD-10-CM | POA: Diagnosis not present

## 2017-05-30 DIAGNOSIS — E7800 Pure hypercholesterolemia, unspecified: Secondary | ICD-10-CM | POA: Diagnosis not present

## 2017-05-30 DIAGNOSIS — R030 Elevated blood-pressure reading, without diagnosis of hypertension: Secondary | ICD-10-CM | POA: Diagnosis not present

## 2017-05-30 DIAGNOSIS — R339 Retention of urine, unspecified: Secondary | ICD-10-CM | POA: Diagnosis not present

## 2017-05-30 DIAGNOSIS — Z86718 Personal history of other venous thrombosis and embolism: Secondary | ICD-10-CM | POA: Diagnosis not present

## 2017-05-30 DIAGNOSIS — D649 Anemia, unspecified: Secondary | ICD-10-CM | POA: Diagnosis not present

## 2017-05-30 DIAGNOSIS — Z90710 Acquired absence of both cervix and uterus: Secondary | ICD-10-CM | POA: Diagnosis not present

## 2017-05-30 DIAGNOSIS — I482 Chronic atrial fibrillation: Secondary | ICD-10-CM | POA: Diagnosis not present

## 2017-05-30 DIAGNOSIS — I10 Essential (primary) hypertension: Secondary | ICD-10-CM | POA: Diagnosis not present

## 2017-05-30 DIAGNOSIS — H409 Unspecified glaucoma: Secondary | ICD-10-CM | POA: Diagnosis not present

## 2017-05-30 DIAGNOSIS — Z79899 Other long term (current) drug therapy: Secondary | ICD-10-CM | POA: Diagnosis not present

## 2017-05-30 DIAGNOSIS — Z9049 Acquired absence of other specified parts of digestive tract: Secondary | ICD-10-CM | POA: Diagnosis not present

## 2017-05-30 DIAGNOSIS — R000 Tachycardia, unspecified: Secondary | ICD-10-CM | POA: Diagnosis not present

## 2017-05-30 DIAGNOSIS — I160 Hypertensive urgency: Secondary | ICD-10-CM | POA: Diagnosis not present

## 2017-05-30 DIAGNOSIS — I69351 Hemiplegia and hemiparesis following cerebral infarction affecting right dominant side: Secondary | ICD-10-CM | POA: Diagnosis not present

## 2017-05-30 DIAGNOSIS — M25559 Pain in unspecified hip: Secondary | ICD-10-CM | POA: Diagnosis not present

## 2017-05-31 DIAGNOSIS — I69351 Hemiplegia and hemiparesis following cerebral infarction affecting right dominant side: Secondary | ICD-10-CM | POA: Diagnosis not present

## 2017-05-31 DIAGNOSIS — I10 Essential (primary) hypertension: Secondary | ICD-10-CM | POA: Diagnosis not present

## 2017-05-31 DIAGNOSIS — R339 Retention of urine, unspecified: Secondary | ICD-10-CM | POA: Diagnosis not present

## 2017-05-31 DIAGNOSIS — N390 Urinary tract infection, site not specified: Secondary | ICD-10-CM | POA: Diagnosis not present

## 2017-06-04 ENCOUNTER — Other Ambulatory Visit: Payer: Self-pay | Admitting: Urology

## 2017-06-04 ENCOUNTER — Ambulatory Visit (INDEPENDENT_AMBULATORY_CARE_PROVIDER_SITE_OTHER): Payer: Medicare Other | Admitting: Urology

## 2017-06-04 DIAGNOSIS — R339 Retention of urine, unspecified: Secondary | ICD-10-CM | POA: Diagnosis not present

## 2017-06-06 DIAGNOSIS — N390 Urinary tract infection, site not specified: Secondary | ICD-10-CM | POA: Diagnosis not present

## 2017-06-06 DIAGNOSIS — I82409 Acute embolism and thrombosis of unspecified deep veins of unspecified lower extremity: Secondary | ICD-10-CM | POA: Diagnosis not present

## 2017-06-06 DIAGNOSIS — I10 Essential (primary) hypertension: Secondary | ICD-10-CM | POA: Diagnosis not present

## 2017-06-06 DIAGNOSIS — F329 Major depressive disorder, single episode, unspecified: Secondary | ICD-10-CM | POA: Diagnosis not present

## 2017-06-06 DIAGNOSIS — Z9359 Other cystostomy status: Secondary | ICD-10-CM | POA: Diagnosis not present

## 2017-06-06 DIAGNOSIS — E7800 Pure hypercholesterolemia, unspecified: Secondary | ICD-10-CM | POA: Diagnosis not present

## 2017-06-06 DIAGNOSIS — Z789 Other specified health status: Secondary | ICD-10-CM | POA: Diagnosis not present

## 2017-06-06 DIAGNOSIS — G8191 Hemiplegia, unspecified affecting right dominant side: Secondary | ICD-10-CM | POA: Diagnosis not present

## 2017-06-06 DIAGNOSIS — Z6826 Body mass index (BMI) 26.0-26.9, adult: Secondary | ICD-10-CM | POA: Diagnosis not present

## 2017-06-06 DIAGNOSIS — E039 Hypothyroidism, unspecified: Secondary | ICD-10-CM | POA: Diagnosis not present

## 2017-06-06 DIAGNOSIS — F419 Anxiety disorder, unspecified: Secondary | ICD-10-CM | POA: Diagnosis not present

## 2017-06-06 DIAGNOSIS — Z299 Encounter for prophylactic measures, unspecified: Secondary | ICD-10-CM | POA: Diagnosis not present

## 2017-06-09 DIAGNOSIS — K219 Gastro-esophageal reflux disease without esophagitis: Secondary | ICD-10-CM | POA: Diagnosis not present

## 2017-06-09 DIAGNOSIS — R531 Weakness: Secondary | ICD-10-CM | POA: Diagnosis not present

## 2017-06-09 DIAGNOSIS — I10 Essential (primary) hypertension: Secondary | ICD-10-CM | POA: Diagnosis not present

## 2017-06-09 DIAGNOSIS — G4700 Insomnia, unspecified: Secondary | ICD-10-CM | POA: Diagnosis not present

## 2017-06-09 DIAGNOSIS — Z6826 Body mass index (BMI) 26.0-26.9, adult: Secondary | ICD-10-CM | POA: Diagnosis not present

## 2017-06-09 DIAGNOSIS — E039 Hypothyroidism, unspecified: Secondary | ICD-10-CM | POA: Diagnosis not present

## 2017-06-09 DIAGNOSIS — R42 Dizziness and giddiness: Secondary | ICD-10-CM | POA: Diagnosis not present

## 2017-06-09 DIAGNOSIS — M25472 Effusion, left ankle: Secondary | ICD-10-CM | POA: Diagnosis not present

## 2017-06-09 DIAGNOSIS — E7800 Pure hypercholesterolemia, unspecified: Secondary | ICD-10-CM | POA: Diagnosis not present

## 2017-06-09 DIAGNOSIS — Z713 Dietary counseling and surveillance: Secondary | ICD-10-CM | POA: Diagnosis not present

## 2017-06-09 DIAGNOSIS — I69351 Hemiplegia and hemiparesis following cerebral infarction affecting right dominant side: Secondary | ICD-10-CM | POA: Diagnosis not present

## 2017-06-09 DIAGNOSIS — Z299 Encounter for prophylactic measures, unspecified: Secondary | ICD-10-CM | POA: Diagnosis not present

## 2017-06-09 NOTE — Patient Instructions (Signed)
Jaclyn Love  06/09/2017       Your procedure is scheduled on  06/16/2017   Report to [facility] at  725  A.M.  Call this number if you have problems the morning of surgery:  [phone]   Remember:  Do not eat food or drink liquids after midnight.  Take these medicines the morning of surgery with A SIP OF WATER  Amlodipine, abiify, wellbutrin, coreg, catapress, synthroid, minoxidil.   Do not wear jewelry, make-up or nail polish.  Do not wear lotions, powders, or perfumes, or deoderant.  Do not shave 48 hours prior to surgery.  Men may shave face and neck.  Do not bring valuables to the hospital.  [facility] is not responsible for any belongings or valuables.  Contacts, dentures or bridgework may not be worn into surgery.  Leave your suitcase in the car.  After surgery it may be brought to your room.  For patients admitted to the hospital, discharge time will be determined by your treatment team.  Patients discharged the day of surgery will not be allowed to drive home.   Name and phone number of your driver:   family Special instructions:  Follow the instructions given to you about ggrenox and coumadin from Dr Ronne Binning. Please read over the following fact sheets that you were given. Anesthesia Post-op Instructions and Care and Recovery After Surgery       Suprapubic Catheter Replacement Suprapubic catheter replacement is a procedure to remove a catheter and insert a new, clean catheter. A suprapubic catheter is a rubber tube that drains urine from the bladder into a collection bag outside of the body. The catheter is inserted into the bladder through a small opening in the lower abdomen, near the center of the body, above the pubic bone (suprapubic area). There is a tiny balloon filled with germ-free (sterile) water on the end of the catheter that is in the bladder. The balloonhelps to keep the catheter in place. If you need to wear a catheter for a long  period of time, you may be instructed to replace the catheter yourself. Usually, suprapubic catheters need to be replaced every 4-6 weeks or as often as told by your health care provider. What are the risks? Generally, this is a safe procedure. However, problems may occur, including failure to get the catheter into the bladder. What happens before the procedure?  Follow instructions from your health care provider about eating or drinking restrictions. Drink plenty of fluids starting several hours before your catheter will be replaced.  Ask your health care provider about changing or stopping your regular medicines. This is especially important if you are taking diabetes medicines or blood thinners.  You may be given antibiotic medicine to help prevent infection.  You may have an exam or testing.  You may have a blood or urine sample taken.  Plan to have someone take you home after the procedure.  If you go home right after the procedure, plan to have someone with you for 24 hours. What happens during the procedure?  To reduce your risk of infection: ? Your health care team will wash or sanitize their hands, and put on sterile gloves. ? The skin around your catheter opening will be washed with sterile cleaning solution.  You will lie on your back.  The water from the balloon will be removed using a syringe.  The catheter will be slowly removed.  Your health care provider will apply lubricant to the new catheter. Lubricant will be applied to the end of the catheter that will go into your bladder.  The new catheter will be inserted through the opening in your abdomen. Your health care provider will slide the catheter into your bladder.  Your health care provider will wait for some urine to start flowing through the catheter. When this happens, a syringe will be used to fill the balloon with sterile water.  A collection bag will be attached to the end of the catheter. The procedure  may vary among health care providers and hospitals. What happens after the procedure?  Make sure that you understand how to care for your catheter, your collection bag, and the opening in your abdomen.  Make sure that you know what to do in case you have difficulty replacing your suprapubic catheter yourself, if this applies. This information is not intended to replace advice given to you by your health care provider. Make sure you discuss any questions you have with your health care provider. Document Released: 07/23/2001 Document Revised: 06/26/2016 Document Reviewed: 07/11/2015 Elsevier Interactive Patient Education  2018 [employer]  Suprapubic Catheter Home Guide A suprapubic catheter is a rubber tube used to drain urine from the bladder into a collection bag. The catheter is inserted into the bladder through a small opening in the in the lower abdomen, near the center of the body, above the pubic bone (suprapubic area). There is a tiny balloon filled with germ-free (sterile) water on the end of the catheter that is in the bladder. The balloon helps to keep the catheter in place. Your suprapubic catheter may need to be replaced every 4-6 weeks, or as often as recommended by your health care provider. The collection bag must be emptied every day and cleaned every 2-3 days. The collection bag can be put beside your bed at night and attached to your leg during the day. You may have a large collection bag to use at night and a smaller one to use during the day. What are the risks?  Urine flow can become blocked. This can happen if the catheter is not working correctly, or if you have a blood clot in your bladder or in the catheter.  Tissue near the catheter may can become irritated and bleed.  Bacteria may get into your bladder and cause a urinary tract infection. How do I change the catheter? Supplies needed  Two pairs of sterile gloves.  Catheter.  Two syringes.  Sterile  water.  Sterile cleaning solution.  Lubricant.  Collection bags. Changing the catheter To replace your catheter, take the following steps: 1. Drink plenty of fluids during the hours before you plan to change the catheter. 2. Wash your hands with soap and water. If soap and water are not available, use hand sanitizer. 3. Lie on your back and put on sterile gloves. 4. Clean the skin around the catheter opening using the sterile cleaning solution. 5. Remove the water from the balloon using a syringe. 6. Slowly remove the catheter. ? Do not pull on the catheter if it seems stuck. ? Call your health care provider immediately if you have difficulty removing the catheter. 7. Take off the used gloves, and put on a new pair. 8. Put lubricant on the end of the new catheter that will go into your bladder. 9. Gently slide the catheter through the opening in your abdomen and into your bladder. 10. Wait for some urine  to start flowing through the catheter. When urine starts to flow through the catheter, use a new syringe to fill the balloon with sterile water. 11. Attach the collection bag to the end of the catheter. Make sure the connection is tight. 12. Remove the gloves and wash your hands with soap and water.  How do I care for my skin around the catheter? Use a clean washcloth and soapy water to clean the skin around your catheter every day. Pat the area dry with a clean towel.  Do not pull on the catheter.  Do not use ointment or lotion on this area unless told by your health care provider.  Check your skin around the catheter every day for signs of infection. Check for: ? Redness, swelling, or pain. ? Fluid or blood. ? Warmth. ? Pus or a bad smell.  How do I clean and empty the collection bag? Clean the collection bag every 2-3 days, or as often as told by your health care provider. To do this, take the following steps:  Wash your hands with soap and water. If soap and water are not  available, use hand sanitizer.  Disconnect the bag from the catheter and immediately attach a new bag to the catheter.  Empty the used bag completely.  Clean the used bag using one of the following methods: ? Rinse the used bag with warm water and soap. ? Fill the bag with water and add 1 tsp of vinegar. Let it sit for about 30 minutes, then empty the bag.  Let the bag dry completely, and put it in a clean plastic bag before storing it. 1.   Empty the large collection bag every 8 hours. Empty the small collection bag when it is about ? full. To empty your large or small collection bag, take the following steps:  Always keep the bag below the level of the catheter. This keeps urine from flowing backwards into the catheter.  Hold the bag over the toilet or another container. Turn the valve (spigot) at the bottom of the bag to empty the urine. ? Do not touch the opening of the spigot. ? Do not let the opening touch the toilet or container.  Close the spigot tightly when the bag is empty.  What are some general tips?  Always wash your hands before and after caring for your catheter and collection bag. Use a mild, fragrance-free soap. If soap and water are not available, use hand sanitizer.  Clean the catheter with soap and water as often as told by your health care provider.  Always make sure there are no twists or curls (kinks) in the catheter tube.  Always make sure there are no leaks in the catheter or collection bag.  Drink enough fluid to keep your urine clear or pale yellow.  Do not take baths, swim, or use a hot tub. When should I seek medical care? Seek medical care if:  You leak urine.  You have redness, swelling, or pain around your catheter opening.  You have fluid or blood coming from your catheter opening.  Your catheter opening feels warm to the touch.  You have pus or a bad smell coming from your catheter opening.  You have a fever or chills.  Your urine  flow slows down.  Your urine becomes cloudy or smelly.  When should I seek immediate medical care? Seek immediate medical care if your catheter comes out, or if you have:  Nausea.  Back pain.  Difficulty changing your catheter.  Blood in your urine.  No urine flow for 1 hour.  This information is not intended to replace advice given to you by your health care provider. Make sure you discuss any questions you have with your health care provider. Document Released: 07/16/2011 Document Revised: 06/26/2016 Document Reviewed: 07/11/2015 Elsevier Interactive Patient Education  2018 [employer] Inc[employer]  Suprapubic Catheter Replacement, Care After Refer to this sheet in the next few weeks. These instructions provide you with information about caring for yourself after your procedure. Your health care provider may also give you more specific instructions. Your treatment has been planned according to current medical practices, but problems sometimes occur. Call your health care provider if you have any problems or questions after your procedure. What can I expect after the procedure? After your procedure, it is possible to have some discomfort around the opening in your abdomen. Follow these instructions at home: Caring for your skin around the catheter Use a clean washcloth and soapy water to clean the skin around your catheter every day. Pat the area dry with a clean towel.  Do not pull on the catheter.  Do not use ointment or lotion on this area unless told by your health care provider.  Check your skin around the catheter every day for signs of infection. Check for: ? Redness, swelling, or pain. ? Fluid or blood. ? Warmth. ? Pus or a bad smell.  Caring for the catheter tube  Clean the catheter tube with soap and water as often as told by your health care provider.  Always make sure there are no twists or curls (kinks) in the catheter tube. Emptying the collection bag Empty the large  collection bag every 8 hours. Empty the small collection bag when it is about ? full. To empty your large or small collection bag, take the following steps:  Always keep the bag below the level of the catheter. This keeps urine from flowing backwards into the catheter.  Hold the bag over the toilet or another container. Turn the valve (spigot) at the bottom of the bag to empty the urine. ? Do not touch the opening of the spigot. ? Do not let the opening touch the toilet or container.  Close the spigot tightly when the bag is empty.  Cleaning the collection bag  Clean the collection bag every 2-3 days, or as often as told by your health care provider. To do this, take the following steps:  Wash your hands with soap and water. If soap and water are not available, use hand sanitizer.  Disconnect the bag from the catheter and immediately attach a new bag to the catheter.  Empty the used bag completely.  Clean the used bag using one of the following methods: ? Rinse the bag with warm water and soap. ? Fill the bag with water and add 1 tsp of vinegar. Let it sit for about 30 minutes, then empty the bag.  Let the bag dry completely, and put it in a clean plastic bag before storing it.  General instructions  Always wash your hands before and after caring for your catheter and collection bag. Use a mild, fragrance-free soap. If soap and water are not available, use hand sanitizer.  Always make sure there are no leaks in the catheter or collection bag.  Drink enough fluid to keep your urine clear or pale yellow.  If you were prescribed an antibiotic medicine, take it as told by your health care  provider. Do not stop taking the antibiotic even if you start to feel better.  Do not take baths, swim, or use a hot tub.  Keep all follow-up appointments as told by your health care provider. This is important. Contact a health care provider if:  You leak urine.  You have redness, swelling,  or pain around your catheter opening.  You have fluid or blood coming from your catheter opening.  Your catheter opening feels warm to the touch.  You have pus or a bad smell coming from your catheter opening.  You have a fever or chills.  Your urine flow slows down.  Your urine becomes cloudy or smelly. Get help right away if:  Your catheter comes out.  You feel nauseous.  You have back pain.  You have difficulty changing your catheter.  You have blood in your urine.  You have no urine flow for 1 hour. This information is not intended to replace advice given to you by your health care provider. Make sure you discuss any questions you have with your health care provider. Document Released: 07/16/2011 Document Revised: 06/26/2016 Document Reviewed: 07/11/2015 Elsevier Interactive Patient Education  2018 [employer]  General Anesthesia, Adult General anesthesia is the use of medicines to make a person "go to sleep" (be unconscious) for a medical procedure. General anesthesia is often recommended when a procedure:  Is long.  Requires you to be still or in an unusual position.  Is major and can cause you to lose blood.  Is impossible to do without general anesthesia.  The medicines used for general anesthesia are called general anesthetics. In addition to making you sleep, the medicines:  Prevent pain.  Control your blood pressure.  Relax your muscles.  Tell a health care provider about:  Any allergies you have.  All medicines you are taking, including vitamins, herbs, eye drops, creams, and over-the-counter medicines.  Any problems you or family members have had with anesthetic medicines.  Types of anesthetics you have had in the past.  Any bleeding disorders you have.  Any surgeries you have had.  Any medical conditions you have.  Any history of heart or lung conditions, such as heart failure, sleep apnea, or chronic obstructive pulmonary disease  (COPD).  Whether you are pregnant or may be pregnant.  Whether you use tobacco, alcohol, marijuana, or street drugs.  Any history of Financial plannermilitary service.  Any history of depression or anxiety. What are the risks? Generally, this is a safe procedure. However, problems may occur, including:  Allergic reaction to anesthetics.  Lung and heart problems.  Inhaling food or liquids from your stomach into your lungs (aspiration).  Injury to nerves.  Waking up during your procedure and being unable to move (rare).  Extreme agitation or a state of mental confusion (delirium) when you wake up from the anesthetic.  Air in the bloodstream, which can lead to stroke.  These problems are more likely to develop if you are having a major surgery or if you have an advanced medical condition. You can prevent some of these complications by answering all of your health care provider's questions thoroughly and by following all pre-procedure instructions. General anesthesia can cause side effects, including:  Nausea or vomiting  A sore throat from the breathing tube.  Feeling cold or shivery.  Feeling tired, washed out, or achy.  Sleepiness or drowsiness.  Confusion or agitation.  What happens before the procedure? Staying hydrated Follow instructions from your health care provider about hydration,  which may include:  Up to 2 hours before the procedure - you may continue to drink clear liquids, such as water, clear fruit juice, black coffee, and plain tea.  Eating and drinking restrictions Follow instructions from your health care provider about eating and drinking, which may include:  8 hours before the procedure - stop eating heavy meals or foods such as meat, fried foods, or fatty foods.  6 hours before the procedure - stop eating light meals or foods, such as toast or cereal.  6 hours before the procedure - stop drinking milk or drinks that contain milk.  2 hours before the procedure  - stop drinking clear liquids.  Medicines  Ask your health care provider about: ? Changing or stopping your regular medicines. This is especially important if you are taking diabetes medicines or blood thinners. ? Taking medicines such as aspirin and ibuprofen. These medicines can thin your blood. Do not take these medicines before your procedure if your health care provider instructs you not to. ? Taking new dietary supplements or medicines. Do not take these during the week before your procedure unless your health care provider approves them.  If you are told to take a medicine or to continue taking a medicine on the day of the procedure, take the medicine with sips of water. General instructions   Ask if you will be going home the same day, the following day, or after a longer hospital stay. ? Plan to have someone take you home. ? Plan to have someone stay with you for the first 24 hours after you leave the hospital or clinic.  For 3-6 weeks before the procedure, try not to use any tobacco products, such as cigarettes, chewing tobacco, and e-cigarettes.  You may brush your teeth on the morning of the procedure, but make sure to spit out the toothpaste. What happens during the procedure?  You will be given anesthetics through a mask and through an IV tube in one of your veins.  You may receive medicine to help you relax (sedative).  As soon as you are asleep, a breathing tube may be used to help you breathe.  An anesthesia specialist will stay with you throughout the procedure. He or she will help keep you comfortable and safe by continuing to give you medicines and adjusting the amount of medicine that you get. He or she will also watch your blood pressure, pulse, and oxygen levels to make sure that the anesthetics do not cause any problems.  If a breathing tube was used to help you breathe, it will be removed before you wake up. The procedure may vary among health care providers  and hospitals. What happens after the procedure?  You will wake up, often slowly, after the procedure is complete, usually in a recovery area.  Your blood pressure, heart rate, breathing rate, and blood oxygen level will be monitored until the medicines you were given have worn off.  You may be given medicine to help you calm down if you feel anxious or agitated.  If you will be going home the same day, your health care provider may check to make sure you can stand, drink, and urinate.  Your health care providers will treat your pain and side effects before you go home.  Do not drive for 24 hours if you received a sedative.  You may: ? Feel nauseous and vomit. ? Have a sore throat. ? Have mental slowness. ? Feel cold or shivery. ? Feel  sleepy. ? Feel tired. ? Feel sore or achy, even in parts of your body where you did not have surgery. This information is not intended to replace advice given to you by your health care provider. Make sure you discuss any questions you have with your health care provider. Document Released: 02/04/2008 Document Revised: 04/09/2016 Document Reviewed: 10/12/2015 Elsevier Interactive Patient Education  2018 [employer]. General Anesthesia, Adult, Care After These instructions provide you with information about caring for yourself after your procedure. Your health care provider may also give you more specific instructions. Your treatment has been planned according to current medical practices, but problems sometimes occur. Call your health care provider if you have any problems or questions after your procedure. What can I expect after the procedure? After the procedure, it is common to have:  Vomiting.  A sore throat.  Mental slowness.  It is common to feel:  Nauseous.  Cold or shivery.  Sleepy.  Tired.  Sore or achy, even in parts of your body where you did not have surgery.  Follow these instructions at home: For at least 24 hours  after the procedure:  Do not: ? Participate in activities where you could fall or become injured. ? Drive. ? Use heavy machinery. ? Drink alcohol. ? Take sleeping pills or medicines that cause drowsiness. ? Make important decisions or sign legal documents. ? Take care of children on your own.  Rest. Eating and drinking  If you vomit, drink water, juice, or soup when you can drink without vomiting.  Drink enough fluid to keep your urine clear or pale yellow.  Make sure you have little or no nausea before eating solid foods.  Follow the diet recommended by your health care provider. General instructions  Have a responsible adult stay with you until you are awake and alert.  Return to your normal activities as told by your health care provider. Ask your health care provider what activities are safe for you.  Take over-the-counter and prescription medicines only as told by your health care provider.  If you smoke, do not smoke without supervision.  Keep all follow-up visits as told by your health care provider. This is important. Contact a health care provider if:  You continue to have nausea or vomiting at home, and medicines are not helpful.  You cannot drink fluids or start eating again.  You cannot urinate after 8-12 hours.  You develop a skin rash.  You have fever.  You have increasing redness at the site of your procedure. Get help right away if:  You have difficulty breathing.  You have chest pain.  You have unexpected bleeding.  You feel that you are having a life-threatening or urgent problem. This information is not intended to replace advice given to you by your health care provider. Make sure you discuss any questions you have with your health care provider. Document Released: 02/03/2001 Document Revised: 04/01/2016 Document Reviewed: 10/12/2015 Elsevier Interactive Patient Education  [employer].

## 2017-06-10 ENCOUNTER — Other Ambulatory Visit: Payer: Self-pay

## 2017-06-10 ENCOUNTER — Encounter (HOSPITAL_COMMUNITY)
Admission: RE | Admit: 2017-06-10 | Discharge: 2017-06-10 | Disposition: A | Discharge status: Home / Self Care | Payer: Medicare Other | Source: Ambulatory Visit | Attending: Urology | Admitting: Urology

## 2017-06-10 ENCOUNTER — Encounter (HOSPITAL_COMMUNITY): Payer: Self-pay

## 2017-06-10 DIAGNOSIS — Z01812 Encounter for preprocedural laboratory examination: Secondary | ICD-10-CM | POA: Insufficient documentation

## 2017-06-10 LAB — CBC WITH DIFFERENTIAL/PLATELET
BASOS PCT: 0 %
Basophils Absolute: 0 10*3/uL (ref 0.0–0.1)
Eosinophils Absolute: 0 10*3/uL (ref 0.0–0.7)
Eosinophils Relative: 0 %
HEMATOCRIT: 30.9 % — AB (ref 36.0–46.0)
HEMOGLOBIN: 10.7 g/dL — AB (ref 12.0–15.0)
LYMPHS ABS: 1.4 10*3/uL (ref 0.7–4.0)
Lymphocytes Relative: 10 %
MCH: 32.2 pg (ref 26.0–34.0)
MCHC: 34.6 g/dL (ref 30.0–36.0)
MCV: 93.1 fL (ref 78.0–100.0)
MONO ABS: 1.1 10*3/uL — AB (ref 0.1–1.0)
MONOS PCT: 8 %
NEUTROS ABS: 11.2 10*3/uL — AB (ref 1.7–7.7)
Neutrophils Relative %: 82 %
Platelets: 191 10*3/uL (ref 150–400)
RBC: 3.32 MIL/uL — AB (ref 3.87–5.11)
RDW: 14.7 % (ref 11.5–15.5)
WBC: 13.7 10*3/uL — ABNORMAL HIGH (ref 4.0–10.5)

## 2017-06-10 LAB — BASIC METABOLIC PANEL
ANION GAP: 8 (ref 5–15)
BUN: 28 mg/dL — ABNORMAL HIGH (ref 6–20)
CHLORIDE: 106 mmol/L (ref 101–111)
CO2: 24 mmol/L (ref 22–32)
Calcium: 9.1 mg/dL (ref 8.9–10.3)
Creatinine, Ser: 1.78 mg/dL — ABNORMAL HIGH (ref 0.44–1.00)
GFR calc non Af Amer: 25 mL/min — ABNORMAL LOW (ref >60)
GFR, EST AFRICAN AMERICAN: 29 mL/min — AB (ref >60)
Glucose, Bld: 112 mg/dL — ABNORMAL HIGH (ref 65–99)
POTASSIUM: 4.5 mmol/L (ref 3.5–5.1)
SODIUM: 138 mmol/L (ref 135–145)

## 2017-06-16 ENCOUNTER — Encounter (HOSPITAL_COMMUNITY)
Admission: RE | Disposition: A | Discharge status: Home / Self Care | Payer: Self-pay | Source: Ambulatory Visit | Attending: Urology

## 2017-06-16 ENCOUNTER — Ambulatory Visit (HOSPITAL_COMMUNITY): Payer: Medicare Other | Admitting: Anesthesiology

## 2017-06-16 ENCOUNTER — Ambulatory Visit (HOSPITAL_COMMUNITY)
Admission: RE | Admit: 2017-06-16 | Discharge: 2017-06-16 | Disposition: A | Discharge status: Home / Self Care | Payer: Medicare Other | Source: Ambulatory Visit | Attending: Urology | Admitting: Urology

## 2017-06-16 DIAGNOSIS — Z79899 Other long term (current) drug therapy: Secondary | ICD-10-CM | POA: Insufficient documentation

## 2017-06-16 DIAGNOSIS — I130 Hypertensive heart and chronic kidney disease with heart failure and stage 1 through stage 4 chronic kidney disease, or unspecified chronic kidney disease: Secondary | ICD-10-CM | POA: Diagnosis not present

## 2017-06-16 DIAGNOSIS — Z8673 Personal history of transient ischemic attack (TIA), and cerebral infarction without residual deficits: Secondary | ICD-10-CM | POA: Insufficient documentation

## 2017-06-16 DIAGNOSIS — N319 Neuromuscular dysfunction of bladder, unspecified: Secondary | ICD-10-CM | POA: Insufficient documentation

## 2017-06-16 DIAGNOSIS — K7689 Other specified diseases of liver: Secondary | ICD-10-CM | POA: Diagnosis not present

## 2017-06-16 DIAGNOSIS — Z7901 Long term (current) use of anticoagulants: Secondary | ICD-10-CM | POA: Diagnosis not present

## 2017-06-16 DIAGNOSIS — E785 Hyperlipidemia, unspecified: Secondary | ICD-10-CM | POA: Diagnosis not present

## 2017-06-16 DIAGNOSIS — R339 Retention of urine, unspecified: Secondary | ICD-10-CM | POA: Insufficient documentation

## 2017-06-16 DIAGNOSIS — Z86718 Personal history of other venous thrombosis and embolism: Secondary | ICD-10-CM | POA: Diagnosis not present

## 2017-06-16 DIAGNOSIS — I5032 Chronic diastolic (congestive) heart failure: Secondary | ICD-10-CM | POA: Insufficient documentation

## 2017-06-16 DIAGNOSIS — Z90710 Acquired absence of both cervix and uterus: Secondary | ICD-10-CM | POA: Diagnosis not present

## 2017-06-16 DIAGNOSIS — Z7982 Long term (current) use of aspirin: Secondary | ICD-10-CM | POA: Insufficient documentation

## 2017-06-16 DIAGNOSIS — N189 Chronic kidney disease, unspecified: Secondary | ICD-10-CM | POA: Insufficient documentation

## 2017-06-16 HISTORY — PX: CYSTOSCOPY: SHX5120

## 2017-06-16 HISTORY — PX: INSERTION OF SUPRAPUBIC CATHETER: SHX5870

## 2017-06-16 SURGERY — INSERTION, SUPRAPUBIC CATHETER
Anesthesia: General

## 2017-06-16 MED ORDER — CEFAZOLIN SODIUM-DEXTROSE 2-4 GM/100ML-% IV SOLN
2.0000 g | INTRAVENOUS | Status: AC
  Administered 2017-06-16: 2 g via INTRAVENOUS
  Filled 2017-06-16: qty 100

## 2017-06-16 MED ORDER — LACTATED RINGERS IV SOLN
INTRAVENOUS | Status: DC
  Administered 2017-06-16: 1000 mL via INTRAVENOUS
  Administered 2017-06-16: 09:00:00 via INTRAVENOUS

## 2017-06-16 MED ORDER — DEXAMETHASONE SODIUM PHOSPHATE 10 MG/ML IJ SOLN
INTRAMUSCULAR | Status: DC | PRN
  Administered 2017-06-16: 10 mg via INTRAVENOUS

## 2017-06-16 MED ORDER — SODIUM CHLORIDE 0.9 % IR SOLN
Status: DC | PRN
  Administered 2017-06-16: 3000 mL via INTRAVESICAL
  Administered 2017-06-16: 1000 mL

## 2017-06-16 MED ORDER — STERILE WATER FOR IRRIGATION IR SOLN
Status: DC | PRN
  Administered 2017-06-16: 1000 mL via INTRAVESICAL

## 2017-06-16 MED ORDER — MIDAZOLAM HCL 2 MG/2ML IJ SOLN
INTRAMUSCULAR | Status: AC
  Filled 2017-06-16: qty 2

## 2017-06-16 MED ORDER — ONDANSETRON HCL 4 MG/2ML IJ SOLN
INTRAMUSCULAR | Status: AC
  Filled 2017-06-16: qty 4

## 2017-06-16 MED ORDER — LIDOCAINE HCL 1 % IJ SOLN
INTRAMUSCULAR | Status: DC | PRN
Start: 2017-06-16 — End: 2017-06-16
  Administered 2017-06-16: 50 mg via INTRADERMAL

## 2017-06-16 MED ORDER — PROPOFOL 10 MG/ML IV BOLUS
INTRAVENOUS | Status: DC | PRN
  Administered 2017-06-16: 100 mg via INTRAVENOUS

## 2017-06-16 MED ORDER — DEXAMETHASONE SODIUM PHOSPHATE 4 MG/ML IJ SOLN
INTRAMUSCULAR | Status: AC
  Filled 2017-06-16: qty 2

## 2017-06-16 MED ORDER — FENTANYL CITRATE (PF) 100 MCG/2ML IJ SOLN
INTRAMUSCULAR | Status: DC | PRN
Start: 2017-06-16 — End: 2017-06-16
  Administered 2017-06-16: 50 ug via INTRAVENOUS
  Administered 2017-06-16: 25 ug via INTRAVENOUS

## 2017-06-16 MED ORDER — ONDANSETRON HCL 4 MG/2ML IJ SOLN
INTRAMUSCULAR | Status: DC | PRN
  Administered 2017-06-16: 4 mg via INTRAVENOUS

## 2017-06-16 MED ORDER — FENTANYL CITRATE (PF) 100 MCG/2ML IJ SOLN: 25.0000 ug | INTRAMUSCULAR | Status: DC | PRN

## 2017-06-16 MED ORDER — PROPOFOL 10 MG/ML IV BOLUS
INTRAVENOUS | Status: AC
  Filled 2017-06-16: qty 20

## 2017-06-16 MED ORDER — MIDAZOLAM HCL 2 MG/2ML IJ SOLN
1.0000 mg | INTRAMUSCULAR | Status: AC
  Administered 2017-06-16 (×2): 1 mg via INTRAVENOUS

## 2017-06-16 MED ORDER — TRAMADOL HCL 50 MG PO TABS: 50.0000 mg | ORAL_TABLET | Freq: Four times a day (QID) | ORAL | 0 refills | Status: AC | PRN

## 2017-06-16 MED ORDER — FENTANYL CITRATE (PF) 100 MCG/2ML IJ SOLN
INTRAMUSCULAR | Status: AC
  Filled 2017-06-16: qty 2

## 2017-06-16 SURGICAL SUPPLY — 37 items
BAG DRAIN URO TABLE W/ADPT NS (DRAPE) ×4 IMPLANT
BAG HAMPER (MISCELLANEOUS) ×4 IMPLANT
BAG URINE DRAIN TURP 4L (OSTOMY) ×8 IMPLANT
BLADE SURG 15 STRL LF DISP TIS (BLADE) ×2 IMPLANT
BLADE SURG 15 STRL SS (BLADE) ×2
CATH FOLEY 2WAY SLVR  5CC 18FR (CATHETERS) ×2
CATH FOLEY 2WAY SLVR 30CC 22FR (CATHETERS) ×4 IMPLANT
CATH FOLEY 2WAY SLVR 5CC 18FR (CATHETERS) ×2 IMPLANT
CHLORAPREP W/TINT 26ML (MISCELLANEOUS) ×4 IMPLANT
CLOTH BEACON ORANGE TIMEOUT ST (SAFETY) ×4 IMPLANT
COVER LIGHT HANDLE STERIS (MISCELLANEOUS) ×8 IMPLANT
DECANTER SPIKE VIAL GLASS SM (MISCELLANEOUS) ×4 IMPLANT
ELECT REM PT RETURN 9FT ADLT (ELECTROSURGICAL) ×4
ELECTRODE REM PT RTRN 9FT ADLT (ELECTROSURGICAL) ×2 IMPLANT
GAUZE SPONGE 4X4 12PLY STRL (GAUZE/BANDAGES/DRESSINGS) ×4 IMPLANT
GLOVE BIO SURGEON STRL SZ8 (GLOVE) ×4 IMPLANT
GOWN STRL REUS W/ TWL LRG LVL3 (GOWN DISPOSABLE) ×2 IMPLANT
GOWN STRL REUS W/ TWL XL LVL3 (GOWN DISPOSABLE) ×2 IMPLANT
GOWN STRL REUS W/TWL LRG LVL3 (GOWN DISPOSABLE) ×2
GOWN STRL REUS W/TWL XL LVL3 (GOWN DISPOSABLE) ×6 IMPLANT
INST SET MINOR GENERAL (KITS) ×4 IMPLANT
KIT ROOM TURNOVER AP CYSTO (KITS) ×4 IMPLANT
KIT SURGICAL DEVON (SET/KITS/TRAYS/PACK) ×4 IMPLANT
MANIFOLD NEPTUNE II (INSTRUMENTS) ×4 IMPLANT
MARKER SKIN DUAL TIP RULER LAB (MISCELLANEOUS) ×4 IMPLANT
NS IRRIG 1000ML POUR BTL (IV SOLUTION) ×4 IMPLANT
PACK CYSTO (CUSTOM PROCEDURE TRAY) ×4 IMPLANT
PAD ARMBOARD 7.5X6 YLW CONV (MISCELLANEOUS) ×4 IMPLANT
PENCIL HANDSWITCHING (ELECTRODE) ×4 IMPLANT
SHEET LAVH (DRAPES) ×4 IMPLANT
SPONGE DRAIN TRACH 4X4 STRL 2S (GAUZE/BANDAGES/DRESSINGS) ×4 IMPLANT
SUT SILK 0 FSL (SUTURE) ×4 IMPLANT
SUT VIC AB 2-0 CT2 27 (SUTURE) ×4 IMPLANT
SYR CONTROL 10ML LL (SYRINGE) ×4 IMPLANT
TAPE CLOTH SURG 4X10 WHT LF (GAUZE/BANDAGES/DRESSINGS) ×4 IMPLANT
WATER STERILE IRR 1000ML POUR (IV SOLUTION) ×4 IMPLANT
WATER STERILE IRR 3000ML UROMA (IV SOLUTION) ×4 IMPLANT

## 2017-06-16 NOTE — Addendum Note (Signed)
Addendum  created 06/16/17 1320 by Brandley Aldrete J, CRNA   Charge Capture section accepted    

## 2017-06-16 NOTE — Discharge Instructions (Signed)
Suprapubic Catheter Replacement, Care After °Refer to this sheet in the next few weeks. These instructions provide you with information about caring for yourself after your procedure. Your health care provider may also give you more specific instructions. Your treatment has been planned according to current medical practices, but problems sometimes occur. Call your health care provider if you have any problems or questions after your procedure. °What can I expect after the procedure? °After your procedure, it is possible to have some discomfort around the opening in your abdomen. °Follow these instructions at home: °Caring for your skin around the catheter °Use a clean washcloth and soapy water to clean the skin around your catheter every day. Pat the area dry with a clean towel. °· Do not pull on the catheter. °· Do not use ointment or lotion on this area unless told by your health care provider. °· Check your skin around the catheter every day for signs of infection. Check for: °? Redness, swelling, or pain. °? Fluid or blood. °? Warmth. °? Pus or a bad smell. ° °Caring for the catheter tube °· Clean the catheter tube with soap and water as often as told by your health care provider. °· Always make sure there are no twists or curls (kinks) in the catheter tube. °Emptying the collection bag °Empty the large collection bag every 8 hours. Empty the small collection bag when it is about ? full. To empty your large or small collection bag, take the following steps: °· Always keep the bag below the level of the catheter. This keeps urine from flowing backwards into the catheter. °· Hold the bag over the toilet or another container. Turn the valve (spigot) at the bottom of the bag to empty the urine. °? Do not touch the opening of the spigot. °? Do not let the opening touch the toilet or container. °· Close the spigot tightly when the bag is empty. ° °Cleaning the collection bag ° °Clean the collection bag every 2-3  days, or as often as told by your health care provider. To do this, take the following steps: °· Wash your hands with soap and water. If soap and water are not available, use hand sanitizer. °· Disconnect the bag from the catheter and immediately attach a new bag to the catheter. °· Empty the used bag completely. °· Clean the used bag using one of the following methods: °? Rinse the bag with warm water and soap. °? Fill the bag with water and add 1 tsp of vinegar. Let it sit for about 30 minutes, then empty the bag. °· Let the bag dry completely, and put it in a clean plastic bag before storing it. ° °General instructions °· Always wash your hands before and after caring for your catheter and collection bag. Use a mild, fragrance-free soap. If soap and water are not available, use hand sanitizer. °· Always make sure there are no leaks in the catheter or collection bag. °· Drink enough fluid to keep your urine clear or pale yellow. °· If you were prescribed an antibiotic medicine, take it as told by your health care provider. Do not stop taking the antibiotic even if you start to feel better. °· Do not take baths, swim, or use a hot tub. °· Keep all follow-up appointments as told by your health care provider. This is important. °Contact a health care provider if: °· You leak urine. °· You have redness, swelling, or pain around your catheter opening. °· You have   fluid or blood coming from your catheter opening. °· Your catheter opening feels warm to the touch. °· You have pus or a bad smell coming from your catheter opening. °· You have a fever or chills. °· Your urine flow slows down. °· Your urine becomes cloudy or smelly. °Get help right away if: °· Your catheter comes out. °· You feel nauseous. °· You have back pain. °· You have difficulty changing your catheter. °· You have blood in your urine. °· You have no urine flow for 1 hour. °This information is not intended to replace advice given to you by your health  care provider. Make sure you discuss any questions you have with your health care provider. °Document Released: 07/16/2011 Document Revised: 06/26/2016 Document Reviewed: 07/11/2015 °Elsevier Interactive Patient Education © 2018 [employer] ° ° ° ° °PATIENT INSTRUCTIONS °POST-ANESTHESIA ° °IMMEDIATELY FOLLOWING SURGERY:  Do not drive or operate machinery for the first twenty four hours after surgery.  Do not make any important decisions for twenty four hours after surgery or while taking narcotic pain medications or sedatives.  If you develop intractable nausea and vomiting or a severe headache please notify your doctor immediately. ° °FOLLOW-UP:  Please make an appointment with your surgeon as instructed. You do not need to follow up with anesthesia unless specifically instructed to do so. ° °WOUND CARE INSTRUCTIONS (if applicable):  Keep a dry clean dressing on the anesthesia/puncture wound site if there is drainage.  Once the wound has quit draining you may leave it open to air.  Generally you should leave the bandage intact for twenty four hours unless there is drainage.  If the epidural site drains for more than 36-48 hours please call the anesthesia department. ° °QUESTIONS?:  Please feel free to call your physician or the hospital operator if you have any questions, and they will be happy to assist you.    ° ° ° °

## 2017-06-16 NOTE — Op Note (Signed)
Preoperative diagnosis: neurogenic bladder, urinary retention  Postop diagnosis: Same  Procedure: 1. Cystoscopy 2. Suprapubic tube placement  Attending: Wilkie AyePatrick Brayson Livesey  Anesthesia: General  Estimated blood loss: 5 cc  Drains: 1. 18 French Foley catheter 2. 22 French SP tube  Specimens: none  Antibiotics: ancef  Findings:  Severe bladder trabeculations. No masses/lesions in the bladder.  Indications: Patient is a 83 year-old with a history ofneurogenic bladder and urinary retention requiring chronic foley catheter. The patient wishes to proceed with SP tube placement..  After discussing treatment options patient decided to proceed with SP tube placement.  Procedure in detail: Prior to procedure consetn was obtained. Patient was brought to the operating room and briefing was done sure correct patient, correct procedure, correct site. General anesthesia was in administered patient was placed in the dorsal lithotomy position. The rigid 22 French cystoscope was passed urethra and bladder. Bladder was inspected masses or lesions and none were found. We then filled the bladder to approximately 500cc until it was easily palpable on the abdominal wall. We then made a 3cm incision 2cm above the pubic symphysis. We dissected through the anterior abdominal wall and down to the dome of the bladder. We sharply incised the bladder and placed a 22 french foley into the bladder. We then filled the balloon with 20cc of water. The SP tube was secured with a 0 silk to the skin. We then closed the incision with interrupted 2-0 vicryls. bladder was then drained and a 18 French Foley catheter was placed. This concluded the procedure which resulted by the patient.  Complications: None  Condition: Stable, extubated, transferred to PACU.  Plan: Patient is to be discharged home. She is to followup in 5 days for foley catheter removal

## 2017-06-16 NOTE — Anesthesia Postprocedure Evaluation (Signed)
Anesthesia Post Note  Patient: Jaclyn Love  Procedure(s) Performed: Procedure(s) (LRB): INSERTION OF SUPRAPUBIC CATHETER (22FR 2Way 20ml Balloon) (N/A) CYSTOSCOPY  Patient location during evaluation: PACU Anesthesia Type: General Level of consciousness: awake and alert and patient cooperative Pain management: pain level controlled Vital Signs Assessment: post-procedure vital signs reviewed and stable Respiratory status: spontaneous breathing Cardiovascular status: blood pressure returned to baseline and stable Postop Assessment: no headache, adequate PO intake and no signs of nausea or vomiting Anesthetic complications: no     Last Vitals:  Vitals:   06/16/17 0845 06/16/17 1018  BP: (!) 135/59 (!) 151/69  Pulse:  65  Resp: (!) 38 10  Temp:  36.5 C    Last Pain:  Vitals:   06/16/17 1018  TempSrc: Oral                 Jaydynn Wolford

## 2017-06-16 NOTE — Anesthesia Preprocedure Evaluation (Signed)
Anesthesia Evaluation  Patient identified by MRN, date of birth, ID band Patient awake    Reviewed: Allergy & Precautions, NPO status , Patient's Chart, lab work & pertinent test results, reviewed documented beta blocker date and time   Airway Mallampati: II  TM Distance: >3 FB     Dental  (+) Teeth Intact   Pulmonary neg pulmonary ROS,    breath sounds clear to auscultation       Cardiovascular hypertension, Pt. on medications and Pt. on home beta blockers  Rhythm:Regular Rate:Normal     Neuro/Psych CVA    GI/Hepatic hiatal hernia,   Endo/Other    Renal/GU Renal InsufficiencyRenal disease     Musculoskeletal   Abdominal   Peds  Hematology  (+) anemia ,   Anesthesia Other Findings   Reproductive/Obstetrics                             Anesthesia Physical Anesthesia Plan  ASA: III  Anesthesia Plan: General   Post-op Pain Management:    Induction: Intravenous  PONV Risk Score and Plan:   Airway Management Planned: LMA  Additional Equipment:   Intra-op Plan:   Post-operative Plan: Extubation in OR  Informed Consent: I have reviewed the patients History and Physical, chart, labs and discussed the procedure including the risks, benefits and alternatives for the proposed anesthesia with the patient or authorized representative who has indicated his/her understanding and acceptance.     Plan Discussed with:   Anesthesia Plan Comments:         Anesthesia Quick Evaluation

## 2017-06-16 NOTE — H&P (Signed)
Urology Admission H&P  Chief Complaint: urinary retention  History of Present Illness: [name] is an 83yo with a hx of urinary retention managed with SP tube. The SP tube was dislodged and it was not able to be replaced. She currently has an indwelling foley  Past Medical History:  Diagnosis Date  . Chronic diastolic heart failure (HCC)   . CKD (chronic kidney disease)   . CVA (cerebral vascular accident) (HCC)   . Glaucoma   . Hepatic cyst 2017   7 cm hepatic cyst  . Hiatal hernia   . History of deep vein thrombosis (DVT) of lower extremity    One episode; on warfarin since  . Hyperlipidemia   . Hypertension    Past Surgical History:  Procedure Laterality Date  . CHOLECYSTECTOMY  1985  . LAPAROSCOPIC LYSIS OF ADHESIONS  2006  . TOTAL ABDOMINAL HYSTERECTOMY  1975    Home Medications:  Prescriptions Prior to Admission  Medication Sig Dispense Refill Last Dose  . allopurinol (ZYLOPRIM) 100 MG tablet Take 100 mg by mouth daily.   06/16/2017 at Unknown time  . amLODipine (NORVASC) 10 MG tablet Take 10 mg by mouth daily.   06/16/2017 at Unknown time  . ARIPiprazole (ABILIFY) 5 MG tablet Take 2.5 mg by mouth daily.   06/16/2017 at Unknown time  . buPROPion (WELLBUTRIN XL) 150 MG 24 hr tablet Take 1 tablet (150 mg total) by mouth daily. 30 tablet 0 06/16/2017 at Unknown time  . Calcium 500 MG tablet Take one tablet by mouth twice daily 60 tablet 0 06/16/2017 at Unknown time  . carvedilol (COREG) 12.5 MG tablet Take 1 tablet (12.5 mg total) by mouth 2 (two) times daily with a meal. 60 tablet 0 06/16/2017 at Unknown time  . cloNIDine (CATAPRES) 0.2 MG tablet Take 1 tablet (0.2 mg total) by mouth 2 (two) times daily. 60 tablet 0 06/16/2017 at Unknown time  . dipyridamole-aspirin (AGGRENOX) 200-25 MG 12hr capsule Take 1 capsule by mouth daily.   06/16/2017 at Unknown time  . ferrous sulfate 325 (65 FE) MG tablet Take 1 tablet (325 mg total) by mouth daily with breakfast. 30 tablet 0 06/16/2017 at Unknown  time  . furosemide (LASIX) 20 MG tablet Take 1 tablet (20 mg total) by mouth daily with lunch. 30 tablet 0 06/15/2017 at Unknown time  . furosemide (LASIX) 40 MG tablet Take 1 tablet (40 mg total) by mouth daily. 30 tablet 0 06/16/2017 at Unknown time  . levothyroxine (SYNTHROID, LEVOTHROID) 25 MCG tablet Take 25 mcg by mouth daily before breakfast.   06/16/2017 at Unknown time  . methocarbamol (ROBAXIN) 500 MG tablet Take 500 mg by mouth 2 (two) times daily.   06/16/2017 at Unknown time  . minoxidil (LONITEN) 2.5 MG tablet Take 1 tablet (2.5 mg total) by mouth daily. 30 tablet 0 06/16/2017 at Unknown time  . Multiple Vitamin (MULTIVITAMIN) tablet Take 1 tablet by mouth daily.   06/16/2017 at Unknown time  . potassium chloride SA (K-DUR,KLOR-CON) 20 MEQ tablet Take 20 mEq by mouth daily.   06/16/2017 at Unknown time  . simvastatin (ZOCOR) 20 MG tablet Take 1 tablet (20 mg total) by mouth at bedtime. 30 tablet 0 06/15/2017 at Unknown time  . telmisartan (MICARDIS) 80 MG tablet Take 80 mg by mouth daily.   06/16/2017 at Unknown time  . warfarin (COUMADIN) 5 MG tablet Take 1 tablet (5 mg total) by mouth daily. (Patient taking differently: Take 5-7.5 mg by mouth See admin instructions.  Pt takes 7.5mg  on Mondays and Thursdays - takes 5mg  all other days) 30 tablet 0 06/16/2017 at Unknown time  . dorzolamide-timolol (COSOPT) 22.3-6.8 MG/ML ophthalmic solution Place 1 drop into the left eye daily. 10 mL 0 09/06/2016 at Unknown time  . latanoprost (XALATAN) 0.005 % ophthalmic solution Place 1 drop into the left eye at bedtime. 2.5 mL 0 09/06/2016 at Unknown time  . Vitamin D, Ergocalciferol, (DRISDOL) 50000 units CAPS capsule Take 50,000 Units by mouth every 7 (seven) days.      Allergies:  Allergies  Allergen Reactions  . Ace Inhibitors Anaphylaxis  . Rocephin [Ceftriaxone Sodium In Dextrose] Anaphylaxis     Facial swelling - Unsure of penicillins, did get dose of Amoxicillin and currently has swelling  . Fish Allergy  Swelling  . Phenergan [Promethazine Hcl] Other (See Comments)    Cns effects/behavioral  . Septra [Sulfamethoxazole-Trimethoprim] Other (See Comments)    Intolerance - question of swelling  . Vasotec [Enalapril Maleate] Swelling  . Xanax [Alprazolam]     Mood tolerance  . Clonidine Derivatives Rash    Rash attributed to glue on the clonidine patch  08/27/16 the patient is on oral Catapres without issue    Family History  Problem Relation Age of Onset  . Cancer Neg Hx   . Heart disease Neg Hx   . Stroke Neg Hx   . Diabetes Neg Hx    Social History:  reports that she has never smoked. She has never used smokeless tobacco. She reports that she does not drink alcohol or use drugs.  Review of Systems  All other systems reviewed and are negative.   Physical Exam:  Vital signs in last 24 hours: Temp:  [98.3 F (36.8 C)] 98.3 F (36.8 C) (08/06 0719) Resp:  [17-83] 47 (08/06 0745) BP: (122-150)/(61-71) 122/61 (08/06 0745) SpO2:  [97 %-100 %] 97 % (08/06 0745) Physical Exam  Constitutional: She is oriented to person, place, and time. She appears well-developed and well-nourished.  HENT:  Head: Normocephalic and atraumatic.  Eyes: Pupils are equal, round, and reactive to light. EOM are normal.  Neck: Normal range of motion. No thyromegaly present.  Cardiovascular: Normal rate and regular rhythm.   Respiratory: Effort normal. No respiratory distress.  GI: Soft. She exhibits no distension.  Musculoskeletal: Normal range of motion. She exhibits no edema.  Neurological: She is alert and oriented to person, place, and time.  Skin: Skin is warm and dry.  Psychiatric: She has a normal mood and affect. Her behavior is normal. Judgment and thought content normal.    Laboratory Data:  No results found for this or any previous visit (from the past 24 hour(s)). No results found for this or any previous visit (from the past 240 hour(s)). Creatinine:  Recent Labs  06/10/17 0904   CREATININE 1.78*   Baseline Creatinine: 1.8  Impression/Assessment:  83yo wih urinary retention  Plan:  The risks/benefits/alternatives to SP tube placement was explained to the patient and she understands and wishes to proceed with surgery  Wilkie AyePatrick Korynn Kenedy 06/16/2017, 8:28 AM

## 2017-06-16 NOTE — Transfer of Care (Signed)
Immediate Anesthesia Transfer of Care Note  Patient: Jaclyn Love  Procedure(s) Performed: Procedure(s): INSERTION OF SUPRAPUBIC CATHETER (22FR 2Way 20ml Balloon) (N/A) CYSTOSCOPY  Patient Location: PACU  Anesthesia Type:General  Level of Consciousness: awake, alert , oriented and patient cooperative  Airway & Oxygen Therapy: Patient Spontanous Breathing and Patient connected to face mask oxygen  Post-op Assessment: Report given to RN and Post -op Vital signs reviewed and stable  Post vital signs: Reviewed and stable  Last Vitals:  Vitals:   06/16/17 0845 06/16/17 1018  BP: (!) 135/59 (!) 151/69  Pulse:  65  Resp: (!) 38 10  Temp:  36.5 C    Last Pain:  Vitals:   06/16/17 1018  TempSrc: Oral         Complications: No apparent anesthesia complications

## 2017-06-17 ENCOUNTER — Encounter (HOSPITAL_COMMUNITY): Payer: Self-pay | Admitting: Urology

## 2017-06-20 ENCOUNTER — Ambulatory Visit (INDEPENDENT_AMBULATORY_CARE_PROVIDER_SITE_OTHER): Payer: Medicare Other | Admitting: Urology

## 2017-06-20 DIAGNOSIS — I10 Essential (primary) hypertension: Secondary | ICD-10-CM | POA: Diagnosis not present

## 2017-06-20 DIAGNOSIS — R339 Retention of urine, unspecified: Secondary | ICD-10-CM | POA: Diagnosis not present

## 2017-06-20 DIAGNOSIS — M79601 Pain in right arm: Secondary | ICD-10-CM | POA: Diagnosis not present

## 2017-06-20 DIAGNOSIS — I82409 Acute embolism and thrombosis of unspecified deep veins of unspecified lower extremity: Secondary | ICD-10-CM | POA: Diagnosis not present

## 2017-06-20 DIAGNOSIS — F329 Major depressive disorder, single episode, unspecified: Secondary | ICD-10-CM | POA: Diagnosis not present

## 2017-06-20 DIAGNOSIS — Z299 Encounter for prophylactic measures, unspecified: Secondary | ICD-10-CM | POA: Diagnosis not present

## 2017-06-20 DIAGNOSIS — Z6826 Body mass index (BMI) 26.0-26.9, adult: Secondary | ICD-10-CM | POA: Diagnosis not present

## 2017-06-27 DIAGNOSIS — G8191 Hemiplegia, unspecified affecting right dominant side: Secondary | ICD-10-CM | POA: Diagnosis not present

## 2017-06-27 DIAGNOSIS — Z299 Encounter for prophylactic measures, unspecified: Secondary | ICD-10-CM | POA: Diagnosis not present

## 2017-06-27 DIAGNOSIS — F419 Anxiety disorder, unspecified: Secondary | ICD-10-CM | POA: Diagnosis not present

## 2017-06-27 DIAGNOSIS — I10 Essential (primary) hypertension: Secondary | ICD-10-CM | POA: Diagnosis not present

## 2017-06-27 DIAGNOSIS — E039 Hypothyroidism, unspecified: Secondary | ICD-10-CM | POA: Diagnosis not present

## 2017-06-27 DIAGNOSIS — F329 Major depressive disorder, single episode, unspecified: Secondary | ICD-10-CM | POA: Diagnosis not present

## 2017-06-27 DIAGNOSIS — I82409 Acute embolism and thrombosis of unspecified deep veins of unspecified lower extremity: Secondary | ICD-10-CM | POA: Diagnosis not present

## 2017-06-27 DIAGNOSIS — Z713 Dietary counseling and surveillance: Secondary | ICD-10-CM | POA: Diagnosis not present

## 2017-07-16 DIAGNOSIS — M1710 Unilateral primary osteoarthritis, unspecified knee: Secondary | ICD-10-CM | POA: Diagnosis not present

## 2017-07-16 DIAGNOSIS — I82409 Acute embolism and thrombosis of unspecified deep veins of unspecified lower extremity: Secondary | ICD-10-CM | POA: Diagnosis not present

## 2017-07-16 DIAGNOSIS — G4700 Insomnia, unspecified: Secondary | ICD-10-CM | POA: Diagnosis not present

## 2017-07-16 DIAGNOSIS — Z299 Encounter for prophylactic measures, unspecified: Secondary | ICD-10-CM | POA: Diagnosis not present

## 2017-07-16 DIAGNOSIS — I10 Essential (primary) hypertension: Secondary | ICD-10-CM | POA: Diagnosis not present

## 2017-07-23 ENCOUNTER — Ambulatory Visit (INDEPENDENT_AMBULATORY_CARE_PROVIDER_SITE_OTHER): Payer: Medicare Other | Admitting: Urology

## 2017-07-23 DIAGNOSIS — R339 Retention of urine, unspecified: Secondary | ICD-10-CM

## 2017-07-24 DIAGNOSIS — I82409 Acute embolism and thrombosis of unspecified deep veins of unspecified lower extremity: Secondary | ICD-10-CM | POA: Diagnosis not present

## 2017-07-24 DIAGNOSIS — I10 Essential (primary) hypertension: Secondary | ICD-10-CM | POA: Diagnosis not present

## 2017-07-24 DIAGNOSIS — G8191 Hemiplegia, unspecified affecting right dominant side: Secondary | ICD-10-CM | POA: Diagnosis not present

## 2017-07-24 DIAGNOSIS — Z299 Encounter for prophylactic measures, unspecified: Secondary | ICD-10-CM | POA: Diagnosis not present

## 2017-07-24 DIAGNOSIS — F419 Anxiety disorder, unspecified: Secondary | ICD-10-CM | POA: Diagnosis not present

## 2017-07-24 DIAGNOSIS — F329 Major depressive disorder, single episode, unspecified: Secondary | ICD-10-CM | POA: Diagnosis not present

## 2017-07-24 DIAGNOSIS — E039 Hypothyroidism, unspecified: Secondary | ICD-10-CM | POA: Diagnosis not present

## 2017-07-30 DIAGNOSIS — I739 Peripheral vascular disease, unspecified: Secondary | ICD-10-CM | POA: Diagnosis not present

## 2017-07-30 DIAGNOSIS — M79672 Pain in left foot: Secondary | ICD-10-CM | POA: Diagnosis not present

## 2017-07-30 DIAGNOSIS — B351 Tinea unguium: Secondary | ICD-10-CM | POA: Diagnosis not present

## 2017-07-30 DIAGNOSIS — M79671 Pain in right foot: Secondary | ICD-10-CM | POA: Diagnosis not present

## 2017-08-13 DIAGNOSIS — I10 Essential (primary) hypertension: Secondary | ICD-10-CM | POA: Diagnosis not present

## 2017-08-13 DIAGNOSIS — E039 Hypothyroidism, unspecified: Secondary | ICD-10-CM | POA: Diagnosis not present

## 2017-08-13 DIAGNOSIS — F329 Major depressive disorder, single episode, unspecified: Secondary | ICD-10-CM | POA: Diagnosis not present

## 2017-08-13 DIAGNOSIS — Z299 Encounter for prophylactic measures, unspecified: Secondary | ICD-10-CM | POA: Diagnosis not present

## 2017-08-13 DIAGNOSIS — R410 Disorientation, unspecified: Secondary | ICD-10-CM | POA: Diagnosis not present

## 2017-08-13 DIAGNOSIS — G8191 Hemiplegia, unspecified affecting right dominant side: Secondary | ICD-10-CM | POA: Diagnosis not present

## 2017-08-13 DIAGNOSIS — F419 Anxiety disorder, unspecified: Secondary | ICD-10-CM | POA: Diagnosis not present

## 2017-08-13 DIAGNOSIS — I82409 Acute embolism and thrombosis of unspecified deep veins of unspecified lower extremity: Secondary | ICD-10-CM | POA: Diagnosis not present

## 2017-08-13 DIAGNOSIS — R319 Hematuria, unspecified: Secondary | ICD-10-CM | POA: Diagnosis not present

## 2017-08-14 DIAGNOSIS — I639 Cerebral infarction, unspecified: Secondary | ICD-10-CM | POA: Diagnosis not present

## 2017-08-14 DIAGNOSIS — E7800 Pure hypercholesterolemia, unspecified: Secondary | ICD-10-CM | POA: Diagnosis not present

## 2017-08-14 DIAGNOSIS — I10 Essential (primary) hypertension: Secondary | ICD-10-CM | POA: Diagnosis not present

## 2017-08-19 DIAGNOSIS — Z713 Dietary counseling and surveillance: Secondary | ICD-10-CM | POA: Diagnosis not present

## 2017-08-19 DIAGNOSIS — K5900 Constipation, unspecified: Secondary | ICD-10-CM | POA: Diagnosis not present

## 2017-08-19 DIAGNOSIS — Z299 Encounter for prophylactic measures, unspecified: Secondary | ICD-10-CM | POA: Diagnosis not present

## 2017-08-19 DIAGNOSIS — Z6826 Body mass index (BMI) 26.0-26.9, adult: Secondary | ICD-10-CM | POA: Diagnosis not present

## 2017-08-19 DIAGNOSIS — I10 Essential (primary) hypertension: Secondary | ICD-10-CM | POA: Diagnosis not present

## 2017-08-21 DIAGNOSIS — Z23 Encounter for immunization: Secondary | ICD-10-CM | POA: Diagnosis not present

## 2017-08-21 DIAGNOSIS — I82409 Acute embolism and thrombosis of unspecified deep veins of unspecified lower extremity: Secondary | ICD-10-CM | POA: Diagnosis not present

## 2017-08-29 ENCOUNTER — Ambulatory Visit: Payer: Medicare Other | Admitting: Urology

## 2017-08-29 DIAGNOSIS — I10 Essential (primary) hypertension: Secondary | ICD-10-CM | POA: Diagnosis not present

## 2017-08-29 DIAGNOSIS — M25472 Effusion, left ankle: Secondary | ICD-10-CM | POA: Diagnosis not present

## 2017-08-29 DIAGNOSIS — Z6826 Body mass index (BMI) 26.0-26.9, adult: Secondary | ICD-10-CM | POA: Diagnosis not present

## 2017-08-29 DIAGNOSIS — Z299 Encounter for prophylactic measures, unspecified: Secondary | ICD-10-CM | POA: Diagnosis not present

## 2017-08-29 DIAGNOSIS — H6123 Impacted cerumen, bilateral: Secondary | ICD-10-CM | POA: Diagnosis not present

## 2017-09-02 ENCOUNTER — Ambulatory Visit (INDEPENDENT_AMBULATORY_CARE_PROVIDER_SITE_OTHER): Payer: Medicare Other | Admitting: Urology

## 2017-09-02 DIAGNOSIS — R339 Retention of urine, unspecified: Secondary | ICD-10-CM

## 2017-09-04 DIAGNOSIS — H6120 Impacted cerumen, unspecified ear: Secondary | ICD-10-CM | POA: Diagnosis not present

## 2017-09-04 DIAGNOSIS — Z299 Encounter for prophylactic measures, unspecified: Secondary | ICD-10-CM | POA: Diagnosis not present

## 2017-09-04 DIAGNOSIS — E663 Overweight: Secondary | ICD-10-CM | POA: Diagnosis not present

## 2017-09-12 DIAGNOSIS — H401112 Primary open-angle glaucoma, right eye, moderate stage: Secondary | ICD-10-CM | POA: Diagnosis not present

## 2017-09-12 DIAGNOSIS — H401123 Primary open-angle glaucoma, left eye, severe stage: Secondary | ICD-10-CM | POA: Diagnosis not present

## 2017-09-19 DIAGNOSIS — I639 Cerebral infarction, unspecified: Secondary | ICD-10-CM | POA: Diagnosis not present

## 2017-09-19 DIAGNOSIS — I10 Essential (primary) hypertension: Secondary | ICD-10-CM | POA: Diagnosis not present

## 2017-09-19 DIAGNOSIS — E7800 Pure hypercholesterolemia, unspecified: Secondary | ICD-10-CM | POA: Diagnosis not present

## 2017-09-26 DIAGNOSIS — I82409 Acute embolism and thrombosis of unspecified deep veins of unspecified lower extremity: Secondary | ICD-10-CM | POA: Diagnosis not present

## 2017-09-26 DIAGNOSIS — Z6825 Body mass index (BMI) 25.0-25.9, adult: Secondary | ICD-10-CM | POA: Diagnosis not present

## 2017-09-26 DIAGNOSIS — G8191 Hemiplegia, unspecified affecting right dominant side: Secondary | ICD-10-CM | POA: Diagnosis not present

## 2017-09-26 DIAGNOSIS — R410 Disorientation, unspecified: Secondary | ICD-10-CM | POA: Diagnosis not present

## 2017-09-26 DIAGNOSIS — Z299 Encounter for prophylactic measures, unspecified: Secondary | ICD-10-CM | POA: Diagnosis not present

## 2017-09-26 DIAGNOSIS — N390 Urinary tract infection, site not specified: Secondary | ICD-10-CM | POA: Diagnosis not present

## 2017-10-07 ENCOUNTER — Ambulatory Visit (INDEPENDENT_AMBULATORY_CARE_PROVIDER_SITE_OTHER): Payer: Medicare Other | Admitting: Urology

## 2017-10-07 DIAGNOSIS — R339 Retention of urine, unspecified: Secondary | ICD-10-CM

## 2017-10-13 DIAGNOSIS — Z6825 Body mass index (BMI) 25.0-25.9, adult: Secondary | ICD-10-CM | POA: Diagnosis not present

## 2017-10-13 DIAGNOSIS — G8191 Hemiplegia, unspecified affecting right dominant side: Secondary | ICD-10-CM | POA: Diagnosis not present

## 2017-10-13 DIAGNOSIS — E039 Hypothyroidism, unspecified: Secondary | ICD-10-CM | POA: Diagnosis not present

## 2017-10-13 DIAGNOSIS — Z299 Encounter for prophylactic measures, unspecified: Secondary | ICD-10-CM | POA: Diagnosis not present

## 2017-10-13 DIAGNOSIS — I82409 Acute embolism and thrombosis of unspecified deep veins of unspecified lower extremity: Secondary | ICD-10-CM | POA: Diagnosis not present

## 2017-10-13 DIAGNOSIS — I10 Essential (primary) hypertension: Secondary | ICD-10-CM | POA: Diagnosis not present

## 2017-10-15 DIAGNOSIS — M79671 Pain in right foot: Secondary | ICD-10-CM | POA: Diagnosis not present

## 2017-10-15 DIAGNOSIS — I739 Peripheral vascular disease, unspecified: Secondary | ICD-10-CM | POA: Diagnosis not present

## 2017-10-15 DIAGNOSIS — B351 Tinea unguium: Secondary | ICD-10-CM | POA: Diagnosis not present

## 2017-10-15 DIAGNOSIS — M79672 Pain in left foot: Secondary | ICD-10-CM | POA: Diagnosis not present

## 2017-10-27 DIAGNOSIS — G8191 Hemiplegia, unspecified affecting right dominant side: Secondary | ICD-10-CM | POA: Diagnosis not present

## 2017-10-27 DIAGNOSIS — Z299 Encounter for prophylactic measures, unspecified: Secondary | ICD-10-CM | POA: Diagnosis not present

## 2017-10-27 DIAGNOSIS — Z713 Dietary counseling and surveillance: Secondary | ICD-10-CM | POA: Diagnosis not present

## 2017-10-27 DIAGNOSIS — Z6825 Body mass index (BMI) 25.0-25.9, adult: Secondary | ICD-10-CM | POA: Diagnosis not present

## 2017-10-27 DIAGNOSIS — I82409 Acute embolism and thrombosis of unspecified deep veins of unspecified lower extremity: Secondary | ICD-10-CM | POA: Diagnosis not present

## 2017-10-29 ENCOUNTER — Ambulatory Visit (INDEPENDENT_AMBULATORY_CARE_PROVIDER_SITE_OTHER): Payer: Medicare Other | Admitting: Urology

## 2017-10-29 DIAGNOSIS — R339 Retention of urine, unspecified: Secondary | ICD-10-CM

## 2017-10-31 DIAGNOSIS — E7800 Pure hypercholesterolemia, unspecified: Secondary | ICD-10-CM | POA: Diagnosis not present

## 2017-10-31 DIAGNOSIS — I639 Cerebral infarction, unspecified: Secondary | ICD-10-CM | POA: Diagnosis not present

## 2017-10-31 DIAGNOSIS — I10 Essential (primary) hypertension: Secondary | ICD-10-CM | POA: Diagnosis not present

## 2017-11-19 DIAGNOSIS — E7800 Pure hypercholesterolemia, unspecified: Secondary | ICD-10-CM | POA: Diagnosis not present

## 2017-11-19 DIAGNOSIS — I10 Essential (primary) hypertension: Secondary | ICD-10-CM | POA: Diagnosis not present

## 2017-11-19 DIAGNOSIS — I639 Cerebral infarction, unspecified: Secondary | ICD-10-CM | POA: Diagnosis not present

## 2017-11-28 ENCOUNTER — Ambulatory Visit (INDEPENDENT_AMBULATORY_CARE_PROVIDER_SITE_OTHER): Payer: Medicare Other | Admitting: Urology

## 2017-11-28 DIAGNOSIS — R339 Retention of urine, unspecified: Secondary | ICD-10-CM

## 2017-12-01 DIAGNOSIS — Z6825 Body mass index (BMI) 25.0-25.9, adult: Secondary | ICD-10-CM | POA: Diagnosis not present

## 2017-12-01 DIAGNOSIS — Z713 Dietary counseling and surveillance: Secondary | ICD-10-CM | POA: Diagnosis not present

## 2017-12-01 DIAGNOSIS — Z299 Encounter for prophylactic measures, unspecified: Secondary | ICD-10-CM | POA: Diagnosis not present

## 2017-12-01 DIAGNOSIS — R443 Hallucinations, unspecified: Secondary | ICD-10-CM | POA: Diagnosis not present

## 2017-12-01 DIAGNOSIS — I82409 Acute embolism and thrombosis of unspecified deep veins of unspecified lower extremity: Secondary | ICD-10-CM | POA: Diagnosis not present

## 2017-12-17 DIAGNOSIS — Z299 Encounter for prophylactic measures, unspecified: Secondary | ICD-10-CM | POA: Diagnosis not present

## 2017-12-17 DIAGNOSIS — R296 Repeated falls: Secondary | ICD-10-CM | POA: Diagnosis not present

## 2017-12-17 DIAGNOSIS — N390 Urinary tract infection, site not specified: Secondary | ICD-10-CM | POA: Diagnosis not present

## 2017-12-17 DIAGNOSIS — R410 Disorientation, unspecified: Secondary | ICD-10-CM | POA: Diagnosis not present

## 2017-12-17 DIAGNOSIS — I10 Essential (primary) hypertension: Secondary | ICD-10-CM | POA: Diagnosis not present

## 2017-12-17 DIAGNOSIS — Z6825 Body mass index (BMI) 25.0-25.9, adult: Secondary | ICD-10-CM | POA: Diagnosis not present

## 2017-12-19 DIAGNOSIS — R410 Disorientation, unspecified: Secondary | ICD-10-CM | POA: Diagnosis not present

## 2017-12-19 DIAGNOSIS — I69351 Hemiplegia and hemiparesis following cerebral infarction affecting right dominant side: Secondary | ICD-10-CM | POA: Diagnosis not present

## 2017-12-19 DIAGNOSIS — I10 Essential (primary) hypertension: Secondary | ICD-10-CM | POA: Diagnosis not present

## 2017-12-19 DIAGNOSIS — M1991 Primary osteoarthritis, unspecified site: Secondary | ICD-10-CM | POA: Diagnosis not present

## 2017-12-19 DIAGNOSIS — G4700 Insomnia, unspecified: Secondary | ICD-10-CM | POA: Diagnosis not present

## 2017-12-19 DIAGNOSIS — Z86718 Personal history of other venous thrombosis and embolism: Secondary | ICD-10-CM | POA: Diagnosis not present

## 2017-12-19 DIAGNOSIS — H532 Diplopia: Secondary | ICD-10-CM | POA: Diagnosis not present

## 2017-12-19 DIAGNOSIS — Z9181 History of falling: Secondary | ICD-10-CM | POA: Diagnosis not present

## 2017-12-19 DIAGNOSIS — N390 Urinary tract infection, site not specified: Secondary | ICD-10-CM | POA: Diagnosis not present

## 2017-12-19 DIAGNOSIS — M1710 Unilateral primary osteoarthritis, unspecified knee: Secondary | ICD-10-CM | POA: Diagnosis not present

## 2017-12-19 DIAGNOSIS — Z7901 Long term (current) use of anticoagulants: Secondary | ICD-10-CM | POA: Diagnosis not present

## 2017-12-19 DIAGNOSIS — K219 Gastro-esophageal reflux disease without esophagitis: Secondary | ICD-10-CM | POA: Diagnosis not present

## 2017-12-24 DIAGNOSIS — I10 Essential (primary) hypertension: Secondary | ICD-10-CM | POA: Diagnosis not present

## 2017-12-24 DIAGNOSIS — I69351 Hemiplegia and hemiparesis following cerebral infarction affecting right dominant side: Secondary | ICD-10-CM | POA: Diagnosis not present

## 2017-12-24 DIAGNOSIS — R410 Disorientation, unspecified: Secondary | ICD-10-CM | POA: Diagnosis not present

## 2017-12-24 DIAGNOSIS — N390 Urinary tract infection, site not specified: Secondary | ICD-10-CM | POA: Diagnosis not present

## 2017-12-24 DIAGNOSIS — M1991 Primary osteoarthritis, unspecified site: Secondary | ICD-10-CM | POA: Diagnosis not present

## 2017-12-24 DIAGNOSIS — M1710 Unilateral primary osteoarthritis, unspecified knee: Secondary | ICD-10-CM | POA: Diagnosis not present

## 2017-12-26 DIAGNOSIS — R410 Disorientation, unspecified: Secondary | ICD-10-CM | POA: Diagnosis not present

## 2017-12-26 DIAGNOSIS — I10 Essential (primary) hypertension: Secondary | ICD-10-CM | POA: Diagnosis not present

## 2017-12-26 DIAGNOSIS — M1991 Primary osteoarthritis, unspecified site: Secondary | ICD-10-CM | POA: Diagnosis not present

## 2017-12-26 DIAGNOSIS — N390 Urinary tract infection, site not specified: Secondary | ICD-10-CM | POA: Diagnosis not present

## 2017-12-26 DIAGNOSIS — I69351 Hemiplegia and hemiparesis following cerebral infarction affecting right dominant side: Secondary | ICD-10-CM | POA: Diagnosis not present

## 2017-12-26 DIAGNOSIS — M1710 Unilateral primary osteoarthritis, unspecified knee: Secondary | ICD-10-CM | POA: Diagnosis not present

## 2017-12-29 DIAGNOSIS — N390 Urinary tract infection, site not specified: Secondary | ICD-10-CM | POA: Diagnosis not present

## 2017-12-29 DIAGNOSIS — I10 Essential (primary) hypertension: Secondary | ICD-10-CM | POA: Diagnosis not present

## 2017-12-29 DIAGNOSIS — I82409 Acute embolism and thrombosis of unspecified deep veins of unspecified lower extremity: Secondary | ICD-10-CM | POA: Diagnosis not present

## 2017-12-29 DIAGNOSIS — Z299 Encounter for prophylactic measures, unspecified: Secondary | ICD-10-CM | POA: Diagnosis not present

## 2017-12-29 DIAGNOSIS — R410 Disorientation, unspecified: Secondary | ICD-10-CM | POA: Diagnosis not present

## 2017-12-29 DIAGNOSIS — H532 Diplopia: Secondary | ICD-10-CM | POA: Diagnosis not present

## 2017-12-29 DIAGNOSIS — Z713 Dietary counseling and surveillance: Secondary | ICD-10-CM | POA: Diagnosis not present

## 2017-12-29 DIAGNOSIS — M1991 Primary osteoarthritis, unspecified site: Secondary | ICD-10-CM | POA: Diagnosis not present

## 2017-12-29 DIAGNOSIS — I69351 Hemiplegia and hemiparesis following cerebral infarction affecting right dominant side: Secondary | ICD-10-CM | POA: Diagnosis not present

## 2017-12-29 DIAGNOSIS — M1710 Unilateral primary osteoarthritis, unspecified knee: Secondary | ICD-10-CM | POA: Diagnosis not present

## 2017-12-30 DIAGNOSIS — I6782 Cerebral ischemia: Secondary | ICD-10-CM | POA: Diagnosis not present

## 2017-12-30 DIAGNOSIS — H532 Diplopia: Secondary | ICD-10-CM | POA: Diagnosis not present

## 2017-12-30 DIAGNOSIS — I10 Essential (primary) hypertension: Secondary | ICD-10-CM | POA: Diagnosis not present

## 2017-12-30 DIAGNOSIS — M1991 Primary osteoarthritis, unspecified site: Secondary | ICD-10-CM | POA: Diagnosis not present

## 2017-12-30 DIAGNOSIS — M1710 Unilateral primary osteoarthritis, unspecified knee: Secondary | ICD-10-CM | POA: Diagnosis not present

## 2017-12-30 DIAGNOSIS — R410 Disorientation, unspecified: Secondary | ICD-10-CM | POA: Diagnosis not present

## 2017-12-30 DIAGNOSIS — I6389 Other cerebral infarction: Secondary | ICD-10-CM | POA: Diagnosis not present

## 2017-12-30 DIAGNOSIS — G319 Degenerative disease of nervous system, unspecified: Secondary | ICD-10-CM | POA: Diagnosis not present

## 2017-12-30 DIAGNOSIS — N390 Urinary tract infection, site not specified: Secondary | ICD-10-CM | POA: Diagnosis not present

## 2017-12-30 DIAGNOSIS — I69351 Hemiplegia and hemiparesis following cerebral infarction affecting right dominant side: Secondary | ICD-10-CM | POA: Diagnosis not present

## 2018-01-02 ENCOUNTER — Ambulatory Visit (INDEPENDENT_AMBULATORY_CARE_PROVIDER_SITE_OTHER): Payer: Medicare Other | Admitting: Urology

## 2018-01-02 DIAGNOSIS — R339 Retention of urine, unspecified: Secondary | ICD-10-CM | POA: Diagnosis not present

## 2018-01-02 DIAGNOSIS — I10 Essential (primary) hypertension: Secondary | ICD-10-CM | POA: Diagnosis not present

## 2018-01-02 DIAGNOSIS — N390 Urinary tract infection, site not specified: Secondary | ICD-10-CM | POA: Diagnosis not present

## 2018-01-02 DIAGNOSIS — M1991 Primary osteoarthritis, unspecified site: Secondary | ICD-10-CM | POA: Diagnosis not present

## 2018-01-02 DIAGNOSIS — M1710 Unilateral primary osteoarthritis, unspecified knee: Secondary | ICD-10-CM | POA: Diagnosis not present

## 2018-01-02 DIAGNOSIS — R410 Disorientation, unspecified: Secondary | ICD-10-CM | POA: Diagnosis not present

## 2018-01-02 DIAGNOSIS — I69351 Hemiplegia and hemiparesis following cerebral infarction affecting right dominant side: Secondary | ICD-10-CM | POA: Diagnosis not present

## 2018-01-05 DIAGNOSIS — R410 Disorientation, unspecified: Secondary | ICD-10-CM | POA: Diagnosis not present

## 2018-01-05 DIAGNOSIS — M1991 Primary osteoarthritis, unspecified site: Secondary | ICD-10-CM | POA: Diagnosis not present

## 2018-01-05 DIAGNOSIS — N390 Urinary tract infection, site not specified: Secondary | ICD-10-CM | POA: Diagnosis not present

## 2018-01-05 DIAGNOSIS — I10 Essential (primary) hypertension: Secondary | ICD-10-CM | POA: Diagnosis not present

## 2018-01-05 DIAGNOSIS — M1710 Unilateral primary osteoarthritis, unspecified knee: Secondary | ICD-10-CM | POA: Diagnosis not present

## 2018-01-05 DIAGNOSIS — I69351 Hemiplegia and hemiparesis following cerebral infarction affecting right dominant side: Secondary | ICD-10-CM | POA: Diagnosis not present

## 2018-01-07 DIAGNOSIS — R410 Disorientation, unspecified: Secondary | ICD-10-CM | POA: Diagnosis not present

## 2018-01-07 DIAGNOSIS — N390 Urinary tract infection, site not specified: Secondary | ICD-10-CM | POA: Diagnosis not present

## 2018-01-07 DIAGNOSIS — M1991 Primary osteoarthritis, unspecified site: Secondary | ICD-10-CM | POA: Diagnosis not present

## 2018-01-07 DIAGNOSIS — M1710 Unilateral primary osteoarthritis, unspecified knee: Secondary | ICD-10-CM | POA: Diagnosis not present

## 2018-01-07 DIAGNOSIS — I10 Essential (primary) hypertension: Secondary | ICD-10-CM | POA: Diagnosis not present

## 2018-01-07 DIAGNOSIS — I69351 Hemiplegia and hemiparesis following cerebral infarction affecting right dominant side: Secondary | ICD-10-CM | POA: Diagnosis not present

## 2018-01-09 DIAGNOSIS — Z6825 Body mass index (BMI) 25.0-25.9, adult: Secondary | ICD-10-CM | POA: Diagnosis not present

## 2018-01-09 DIAGNOSIS — H532 Diplopia: Secondary | ICD-10-CM | POA: Diagnosis not present

## 2018-01-09 DIAGNOSIS — I82409 Acute embolism and thrombosis of unspecified deep veins of unspecified lower extremity: Secondary | ICD-10-CM | POA: Diagnosis not present

## 2018-01-09 DIAGNOSIS — Z299 Encounter for prophylactic measures, unspecified: Secondary | ICD-10-CM | POA: Diagnosis not present

## 2018-01-09 DIAGNOSIS — Z713 Dietary counseling and surveillance: Secondary | ICD-10-CM | POA: Diagnosis not present

## 2018-01-12 DIAGNOSIS — M1710 Unilateral primary osteoarthritis, unspecified knee: Secondary | ICD-10-CM | POA: Diagnosis not present

## 2018-01-12 DIAGNOSIS — I10 Essential (primary) hypertension: Secondary | ICD-10-CM | POA: Diagnosis not present

## 2018-01-12 DIAGNOSIS — R410 Disorientation, unspecified: Secondary | ICD-10-CM | POA: Diagnosis not present

## 2018-01-12 DIAGNOSIS — N390 Urinary tract infection, site not specified: Secondary | ICD-10-CM | POA: Diagnosis not present

## 2018-01-12 DIAGNOSIS — M1991 Primary osteoarthritis, unspecified site: Secondary | ICD-10-CM | POA: Diagnosis not present

## 2018-01-12 DIAGNOSIS — I69351 Hemiplegia and hemiparesis following cerebral infarction affecting right dominant side: Secondary | ICD-10-CM | POA: Diagnosis not present

## 2018-01-14 DIAGNOSIS — I10 Essential (primary) hypertension: Secondary | ICD-10-CM | POA: Diagnosis not present

## 2018-01-14 DIAGNOSIS — M1991 Primary osteoarthritis, unspecified site: Secondary | ICD-10-CM | POA: Diagnosis not present

## 2018-01-14 DIAGNOSIS — N390 Urinary tract infection, site not specified: Secondary | ICD-10-CM | POA: Diagnosis not present

## 2018-01-14 DIAGNOSIS — I69351 Hemiplegia and hemiparesis following cerebral infarction affecting right dominant side: Secondary | ICD-10-CM | POA: Diagnosis not present

## 2018-01-14 DIAGNOSIS — M1710 Unilateral primary osteoarthritis, unspecified knee: Secondary | ICD-10-CM | POA: Diagnosis not present

## 2018-01-14 DIAGNOSIS — R410 Disorientation, unspecified: Secondary | ICD-10-CM | POA: Diagnosis not present

## 2018-01-16 DIAGNOSIS — M1710 Unilateral primary osteoarthritis, unspecified knee: Secondary | ICD-10-CM | POA: Diagnosis not present

## 2018-01-16 DIAGNOSIS — N390 Urinary tract infection, site not specified: Secondary | ICD-10-CM | POA: Diagnosis not present

## 2018-01-16 DIAGNOSIS — M1991 Primary osteoarthritis, unspecified site: Secondary | ICD-10-CM | POA: Diagnosis not present

## 2018-01-16 DIAGNOSIS — R410 Disorientation, unspecified: Secondary | ICD-10-CM | POA: Diagnosis not present

## 2018-01-16 DIAGNOSIS — I10 Essential (primary) hypertension: Secondary | ICD-10-CM | POA: Diagnosis not present

## 2018-01-16 DIAGNOSIS — I69351 Hemiplegia and hemiparesis following cerebral infarction affecting right dominant side: Secondary | ICD-10-CM | POA: Diagnosis not present

## 2018-01-19 DIAGNOSIS — R410 Disorientation, unspecified: Secondary | ICD-10-CM | POA: Diagnosis not present

## 2018-01-19 DIAGNOSIS — M1991 Primary osteoarthritis, unspecified site: Secondary | ICD-10-CM | POA: Diagnosis not present

## 2018-01-19 DIAGNOSIS — M1710 Unilateral primary osteoarthritis, unspecified knee: Secondary | ICD-10-CM | POA: Diagnosis not present

## 2018-01-19 DIAGNOSIS — N390 Urinary tract infection, site not specified: Secondary | ICD-10-CM | POA: Diagnosis not present

## 2018-01-19 DIAGNOSIS — I69351 Hemiplegia and hemiparesis following cerebral infarction affecting right dominant side: Secondary | ICD-10-CM | POA: Diagnosis not present

## 2018-01-19 DIAGNOSIS — I10 Essential (primary) hypertension: Secondary | ICD-10-CM | POA: Diagnosis not present

## 2018-01-23 DIAGNOSIS — N390 Urinary tract infection, site not specified: Secondary | ICD-10-CM | POA: Diagnosis not present

## 2018-01-23 DIAGNOSIS — R410 Disorientation, unspecified: Secondary | ICD-10-CM | POA: Diagnosis not present

## 2018-01-23 DIAGNOSIS — M1710 Unilateral primary osteoarthritis, unspecified knee: Secondary | ICD-10-CM | POA: Diagnosis not present

## 2018-01-23 DIAGNOSIS — I10 Essential (primary) hypertension: Secondary | ICD-10-CM | POA: Diagnosis not present

## 2018-01-23 DIAGNOSIS — I69351 Hemiplegia and hemiparesis following cerebral infarction affecting right dominant side: Secondary | ICD-10-CM | POA: Diagnosis not present

## 2018-01-23 DIAGNOSIS — M1991 Primary osteoarthritis, unspecified site: Secondary | ICD-10-CM | POA: Diagnosis not present

## 2018-01-28 DIAGNOSIS — N390 Urinary tract infection, site not specified: Secondary | ICD-10-CM | POA: Diagnosis not present

## 2018-01-28 DIAGNOSIS — M1991 Primary osteoarthritis, unspecified site: Secondary | ICD-10-CM | POA: Diagnosis not present

## 2018-01-28 DIAGNOSIS — M1710 Unilateral primary osteoarthritis, unspecified knee: Secondary | ICD-10-CM | POA: Diagnosis not present

## 2018-01-28 DIAGNOSIS — I69351 Hemiplegia and hemiparesis following cerebral infarction affecting right dominant side: Secondary | ICD-10-CM | POA: Diagnosis not present

## 2018-01-28 DIAGNOSIS — E7800 Pure hypercholesterolemia, unspecified: Secondary | ICD-10-CM | POA: Diagnosis not present

## 2018-01-28 DIAGNOSIS — R410 Disorientation, unspecified: Secondary | ICD-10-CM | POA: Diagnosis not present

## 2018-01-28 DIAGNOSIS — I10 Essential (primary) hypertension: Secondary | ICD-10-CM | POA: Diagnosis not present

## 2018-01-28 DIAGNOSIS — I639 Cerebral infarction, unspecified: Secondary | ICD-10-CM | POA: Diagnosis not present

## 2018-01-30 ENCOUNTER — Ambulatory Visit (INDEPENDENT_AMBULATORY_CARE_PROVIDER_SITE_OTHER): Payer: Medicare Other | Admitting: Urology

## 2018-01-30 DIAGNOSIS — R339 Retention of urine, unspecified: Secondary | ICD-10-CM | POA: Diagnosis not present

## 2018-02-02 DIAGNOSIS — I82409 Acute embolism and thrombosis of unspecified deep veins of unspecified lower extremity: Secondary | ICD-10-CM | POA: Diagnosis not present

## 2018-02-02 DIAGNOSIS — Z299 Encounter for prophylactic measures, unspecified: Secondary | ICD-10-CM | POA: Diagnosis not present

## 2018-02-02 DIAGNOSIS — G8191 Hemiplegia, unspecified affecting right dominant side: Secondary | ICD-10-CM | POA: Diagnosis not present

## 2018-02-02 DIAGNOSIS — Z713 Dietary counseling and surveillance: Secondary | ICD-10-CM | POA: Diagnosis not present

## 2018-02-02 DIAGNOSIS — Z6825 Body mass index (BMI) 25.0-25.9, adult: Secondary | ICD-10-CM | POA: Diagnosis not present

## 2018-02-03 DIAGNOSIS — M1991 Primary osteoarthritis, unspecified site: Secondary | ICD-10-CM | POA: Diagnosis not present

## 2018-02-03 DIAGNOSIS — I69351 Hemiplegia and hemiparesis following cerebral infarction affecting right dominant side: Secondary | ICD-10-CM | POA: Diagnosis not present

## 2018-02-03 DIAGNOSIS — I10 Essential (primary) hypertension: Secondary | ICD-10-CM | POA: Diagnosis not present

## 2018-02-03 DIAGNOSIS — M1710 Unilateral primary osteoarthritis, unspecified knee: Secondary | ICD-10-CM | POA: Diagnosis not present

## 2018-02-03 DIAGNOSIS — N390 Urinary tract infection, site not specified: Secondary | ICD-10-CM | POA: Diagnosis not present

## 2018-02-03 DIAGNOSIS — R410 Disorientation, unspecified: Secondary | ICD-10-CM | POA: Diagnosis not present

## 2018-02-04 DIAGNOSIS — M1991 Primary osteoarthritis, unspecified site: Secondary | ICD-10-CM | POA: Diagnosis not present

## 2018-02-04 DIAGNOSIS — I69351 Hemiplegia and hemiparesis following cerebral infarction affecting right dominant side: Secondary | ICD-10-CM | POA: Diagnosis not present

## 2018-02-04 DIAGNOSIS — R410 Disorientation, unspecified: Secondary | ICD-10-CM | POA: Diagnosis not present

## 2018-02-04 DIAGNOSIS — M1710 Unilateral primary osteoarthritis, unspecified knee: Secondary | ICD-10-CM | POA: Diagnosis not present

## 2018-02-04 DIAGNOSIS — N390 Urinary tract infection, site not specified: Secondary | ICD-10-CM | POA: Diagnosis not present

## 2018-02-04 DIAGNOSIS — I10 Essential (primary) hypertension: Secondary | ICD-10-CM | POA: Diagnosis not present

## 2018-02-09 DIAGNOSIS — M1710 Unilateral primary osteoarthritis, unspecified knee: Secondary | ICD-10-CM | POA: Diagnosis not present

## 2018-02-09 DIAGNOSIS — R410 Disorientation, unspecified: Secondary | ICD-10-CM | POA: Diagnosis not present

## 2018-02-09 DIAGNOSIS — N390 Urinary tract infection, site not specified: Secondary | ICD-10-CM | POA: Diagnosis not present

## 2018-02-09 DIAGNOSIS — I10 Essential (primary) hypertension: Secondary | ICD-10-CM | POA: Diagnosis not present

## 2018-02-09 DIAGNOSIS — I69351 Hemiplegia and hemiparesis following cerebral infarction affecting right dominant side: Secondary | ICD-10-CM | POA: Diagnosis not present

## 2018-02-09 DIAGNOSIS — M1991 Primary osteoarthritis, unspecified site: Secondary | ICD-10-CM | POA: Diagnosis not present

## 2018-02-25 DIAGNOSIS — H538 Other visual disturbances: Secondary | ICD-10-CM | POA: Diagnosis not present

## 2018-02-27 DIAGNOSIS — H532 Diplopia: Secondary | ICD-10-CM | POA: Diagnosis not present

## 2018-03-02 DIAGNOSIS — I82409 Acute embolism and thrombosis of unspecified deep veins of unspecified lower extremity: Secondary | ICD-10-CM | POA: Diagnosis not present

## 2018-03-02 DIAGNOSIS — Z6825 Body mass index (BMI) 25.0-25.9, adult: Secondary | ICD-10-CM | POA: Diagnosis not present

## 2018-03-02 DIAGNOSIS — G8191 Hemiplegia, unspecified affecting right dominant side: Secondary | ICD-10-CM | POA: Diagnosis not present

## 2018-03-02 DIAGNOSIS — Z299 Encounter for prophylactic measures, unspecified: Secondary | ICD-10-CM | POA: Diagnosis not present

## 2018-03-02 DIAGNOSIS — G459 Transient cerebral ischemic attack, unspecified: Secondary | ICD-10-CM | POA: Diagnosis not present

## 2018-03-02 DIAGNOSIS — G453 Amaurosis fugax: Secondary | ICD-10-CM | POA: Diagnosis not present

## 2018-03-03 ENCOUNTER — Ambulatory Visit (INDEPENDENT_AMBULATORY_CARE_PROVIDER_SITE_OTHER): Payer: Medicare Other | Admitting: Urology

## 2018-03-03 DIAGNOSIS — R339 Retention of urine, unspecified: Secondary | ICD-10-CM | POA: Diagnosis not present

## 2018-03-05 DIAGNOSIS — E7800 Pure hypercholesterolemia, unspecified: Secondary | ICD-10-CM | POA: Diagnosis not present

## 2018-03-05 DIAGNOSIS — I639 Cerebral infarction, unspecified: Secondary | ICD-10-CM | POA: Diagnosis not present

## 2018-03-05 DIAGNOSIS — I10 Essential (primary) hypertension: Secondary | ICD-10-CM | POA: Diagnosis not present

## 2018-03-16 DIAGNOSIS — G459 Transient cerebral ischemic attack, unspecified: Secondary | ICD-10-CM | POA: Diagnosis not present

## 2018-03-16 DIAGNOSIS — I6523 Occlusion and stenosis of bilateral carotid arteries: Secondary | ICD-10-CM | POA: Diagnosis not present

## 2018-03-18 ENCOUNTER — Ambulatory Visit: Payer: Medicare Other | Admitting: Diagnostic Neuroimaging

## 2018-03-18 ENCOUNTER — Encounter

## 2018-03-31 ENCOUNTER — Ambulatory Visit (INDEPENDENT_AMBULATORY_CARE_PROVIDER_SITE_OTHER): Payer: Medicare Other | Admitting: Urology

## 2018-03-31 DIAGNOSIS — R339 Retention of urine, unspecified: Secondary | ICD-10-CM | POA: Diagnosis not present

## 2018-04-03 DIAGNOSIS — Z299 Encounter for prophylactic measures, unspecified: Secondary | ICD-10-CM | POA: Diagnosis not present

## 2018-04-03 DIAGNOSIS — I82409 Acute embolism and thrombosis of unspecified deep veins of unspecified lower extremity: Secondary | ICD-10-CM | POA: Diagnosis not present

## 2018-04-03 DIAGNOSIS — Z713 Dietary counseling and surveillance: Secondary | ICD-10-CM | POA: Diagnosis not present

## 2018-04-03 DIAGNOSIS — Z6825 Body mass index (BMI) 25.0-25.9, adult: Secondary | ICD-10-CM | POA: Diagnosis not present

## 2018-04-03 DIAGNOSIS — I10 Essential (primary) hypertension: Secondary | ICD-10-CM | POA: Diagnosis not present

## 2018-04-17 DIAGNOSIS — Z299 Encounter for prophylactic measures, unspecified: Secondary | ICD-10-CM | POA: Diagnosis not present

## 2018-04-17 DIAGNOSIS — Z6825 Body mass index (BMI) 25.0-25.9, adult: Secondary | ICD-10-CM | POA: Diagnosis not present

## 2018-04-17 DIAGNOSIS — I82409 Acute embolism and thrombosis of unspecified deep veins of unspecified lower extremity: Secondary | ICD-10-CM | POA: Diagnosis not present

## 2018-04-17 DIAGNOSIS — Z713 Dietary counseling and surveillance: Secondary | ICD-10-CM | POA: Diagnosis not present

## 2018-04-21 DIAGNOSIS — Z299 Encounter for prophylactic measures, unspecified: Secondary | ICD-10-CM | POA: Diagnosis not present

## 2018-04-21 DIAGNOSIS — G8191 Hemiplegia, unspecified affecting right dominant side: Secondary | ICD-10-CM | POA: Diagnosis not present

## 2018-04-21 DIAGNOSIS — I10 Essential (primary) hypertension: Secondary | ICD-10-CM | POA: Diagnosis not present

## 2018-04-21 DIAGNOSIS — Z1339 Encounter for screening examination for other mental health and behavioral disorders: Secondary | ICD-10-CM | POA: Diagnosis not present

## 2018-04-21 DIAGNOSIS — I82409 Acute embolism and thrombosis of unspecified deep veins of unspecified lower extremity: Secondary | ICD-10-CM | POA: Diagnosis not present

## 2018-04-21 DIAGNOSIS — Z1331 Encounter for screening for depression: Secondary | ICD-10-CM | POA: Diagnosis not present

## 2018-04-21 DIAGNOSIS — Z7189 Other specified counseling: Secondary | ICD-10-CM | POA: Diagnosis not present

## 2018-04-21 DIAGNOSIS — F329 Major depressive disorder, single episode, unspecified: Secondary | ICD-10-CM | POA: Diagnosis not present

## 2018-04-21 DIAGNOSIS — R5383 Other fatigue: Secondary | ICD-10-CM | POA: Diagnosis not present

## 2018-04-21 DIAGNOSIS — Z0000 Encounter for general adult medical examination without abnormal findings: Secondary | ICD-10-CM | POA: Diagnosis not present

## 2018-04-21 DIAGNOSIS — E039 Hypothyroidism, unspecified: Secondary | ICD-10-CM | POA: Diagnosis not present

## 2018-04-21 DIAGNOSIS — Z79899 Other long term (current) drug therapy: Secondary | ICD-10-CM | POA: Diagnosis not present

## 2018-04-21 DIAGNOSIS — E7800 Pure hypercholesterolemia, unspecified: Secondary | ICD-10-CM | POA: Diagnosis not present

## 2018-04-29 ENCOUNTER — Ambulatory Visit (INDEPENDENT_AMBULATORY_CARE_PROVIDER_SITE_OTHER): Payer: Medicare Other | Admitting: Urology

## 2018-04-29 DIAGNOSIS — R339 Retention of urine, unspecified: Secondary | ICD-10-CM

## 2018-04-30 DIAGNOSIS — N3941 Urge incontinence: Secondary | ICD-10-CM | POA: Diagnosis not present

## 2018-05-12 IMAGING — DX DG CHEST 2V
2 series · 2 of 2 positions shown · non-contrast
Comparison: 04/27/2015 and 01/04/2012

CLINICAL DATA: Altered mental status.  Weakness.

EXAM:
CHEST  2 VIEW

[w chest lat]
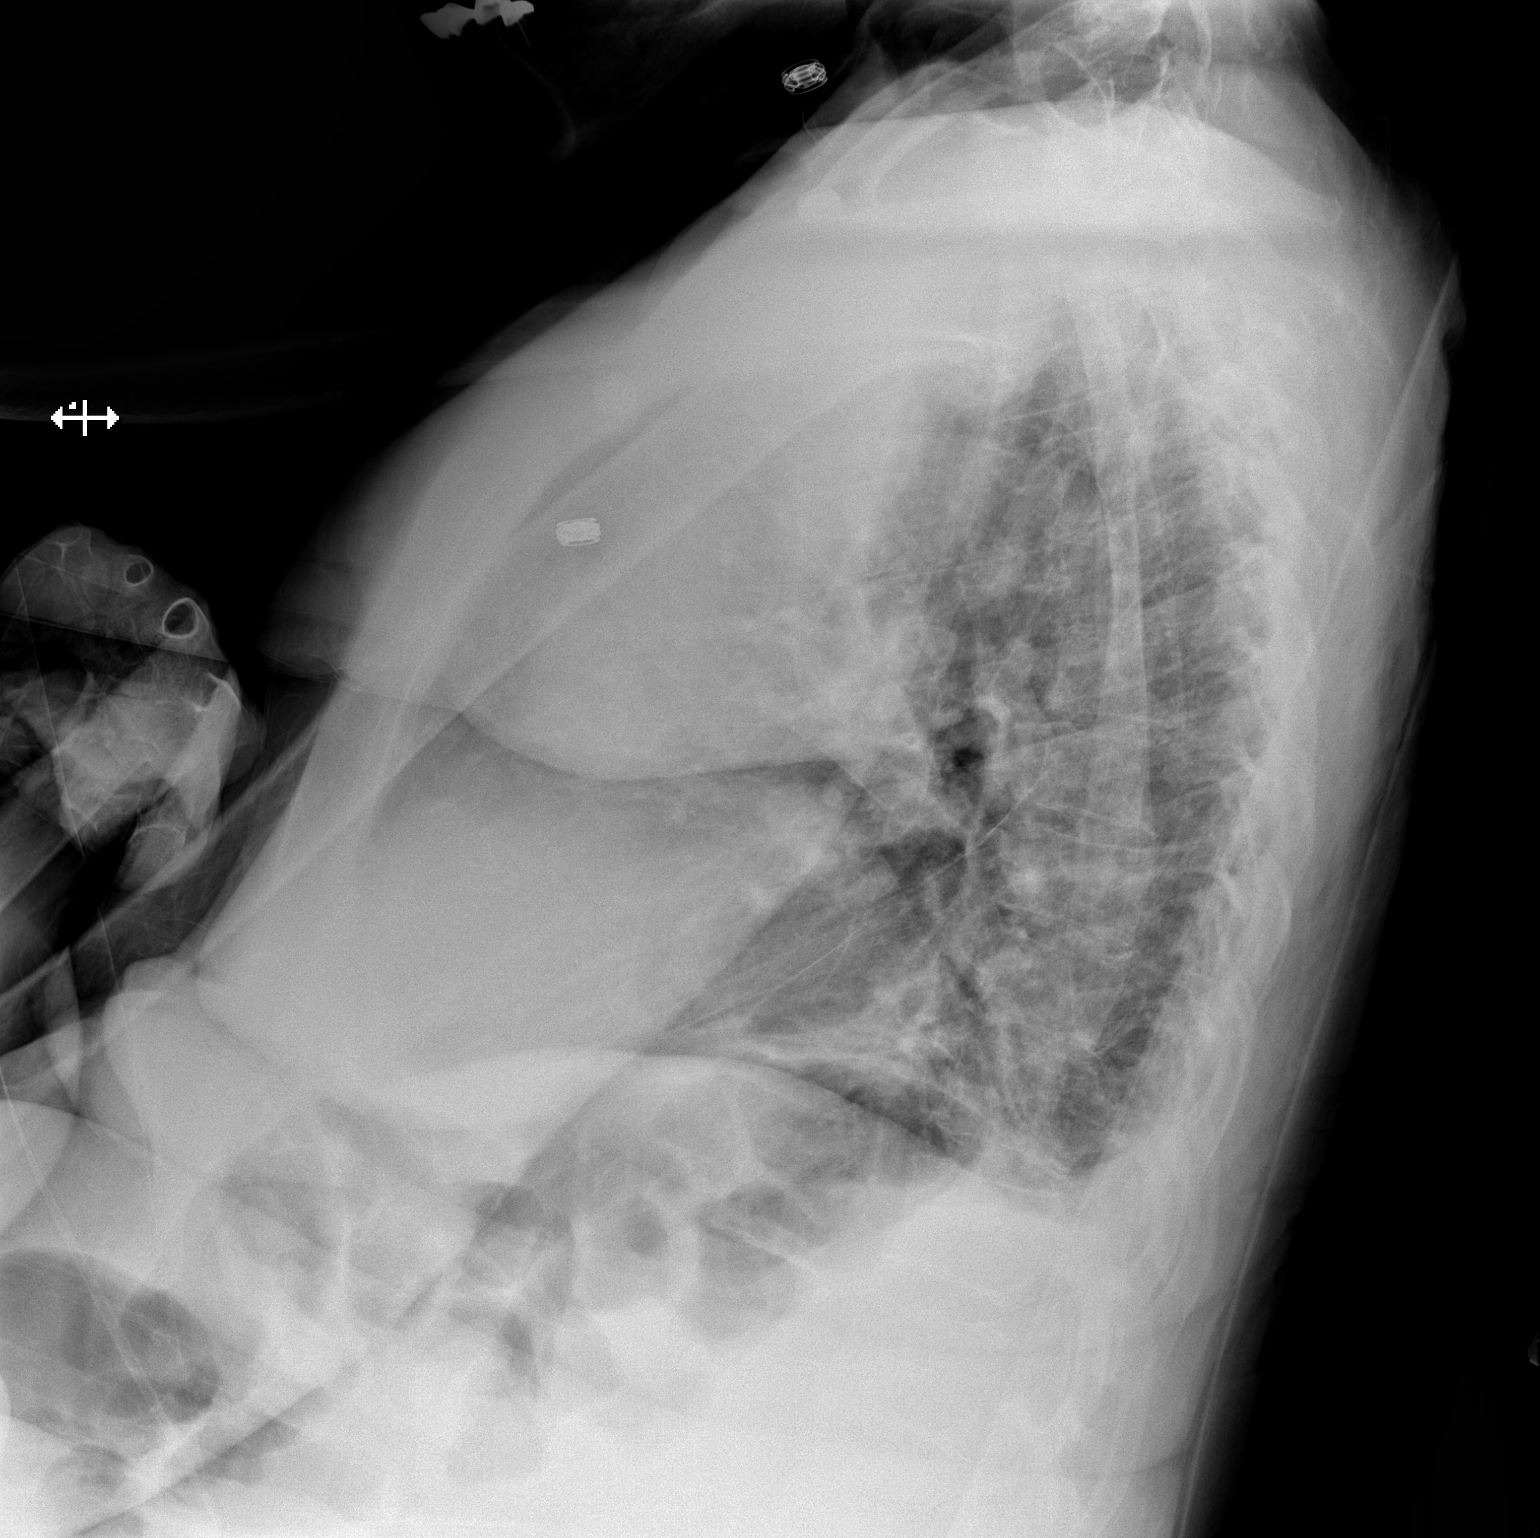

[x chest ap]
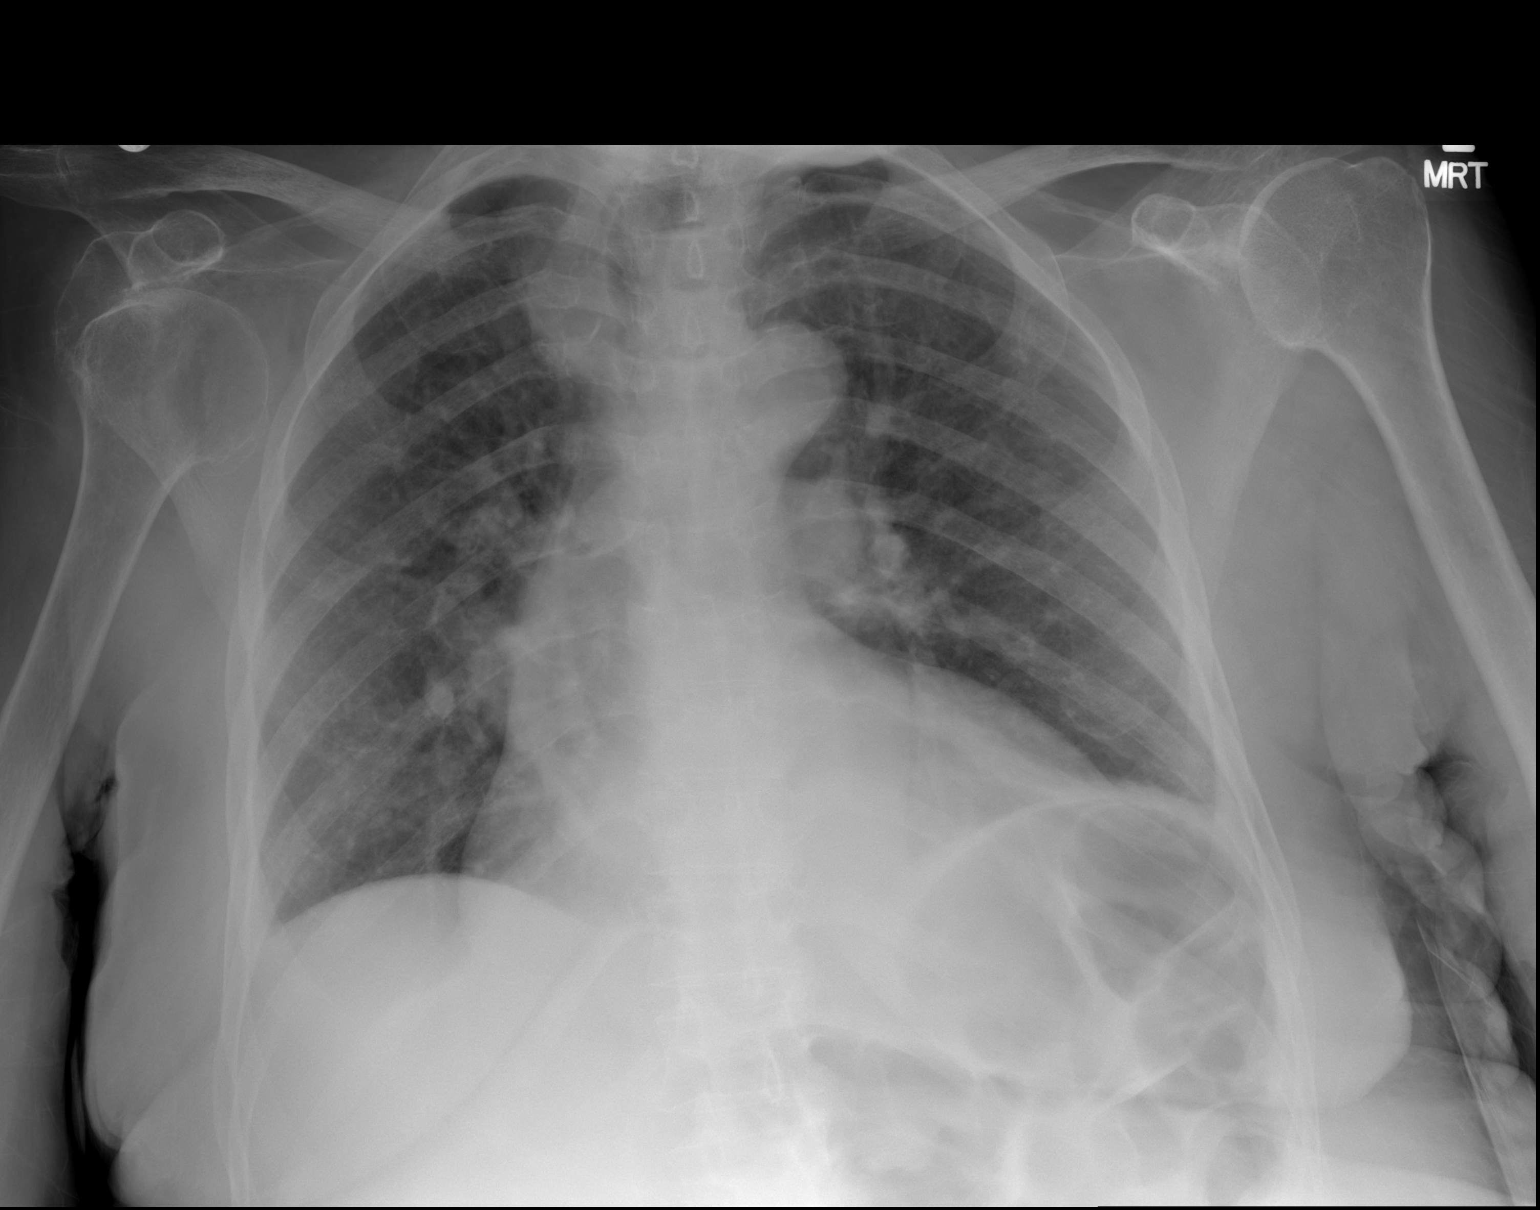

[2 of 2 positions shown; findings below may reference images not displayed]

FINDINGS: There is chronic cardiomegaly with tortuosity of the thoracic aorta
and brachiocephalic vessels.

Minimal atelectasis at the left lung base posteriorly. Tiny
bilateral effusions. No acute bone abnormality. Chronic dislocation
of the right humeral head.
IMPRESSION: 1. Minimal atelectasis at the left lung base with tiny bilateral
pleural effusions.
2. Chronic dislocation of the right humeral head, unchanged since

## 2018-05-12 IMAGING — CT CT HEAD W/O CM
4 series · 17 of 47 positions shown, 19 images · non-contrast
Comparison: CT head 06/09/2016

CLINICAL DATA: Altered mental status

EXAM:
CT HEAD WITHOUT CONTRAST
TECHNIQUE: Contiguous axial images were obtained from the base of the skull
through the vertex without intravenous contrast.

[Series 2: head without · axial · non-contrast · 0.44mm/px · z∈[-117,-2]mm · 7 of 31 slices shown, 9 images]
[im 4/31  brain]
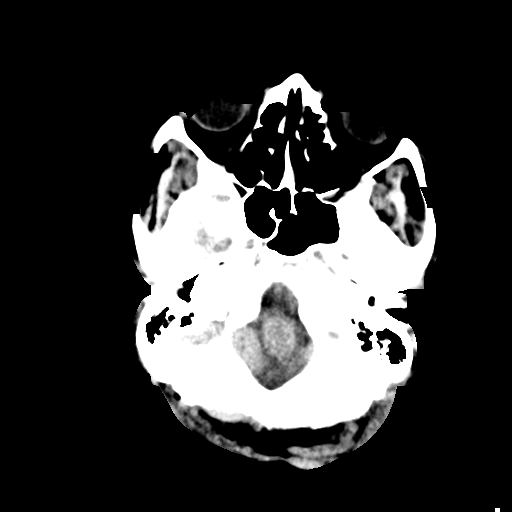
[im 4/31  bone]
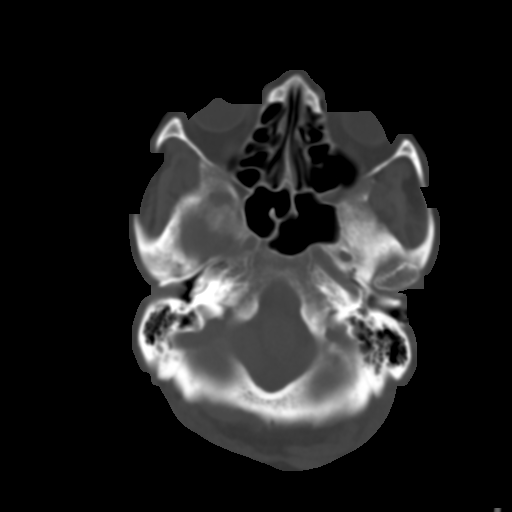
[im 8/31  brain]
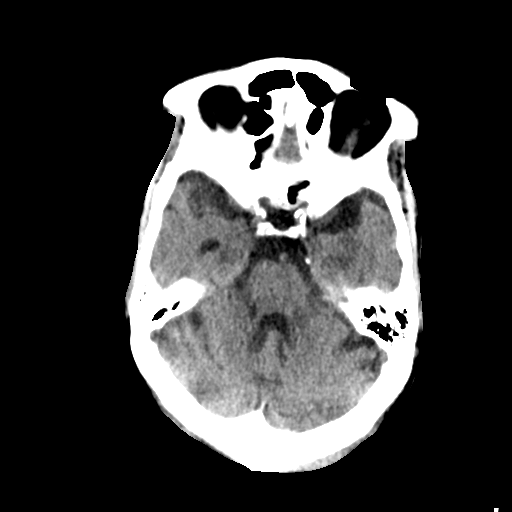
[im 12/31  brain]
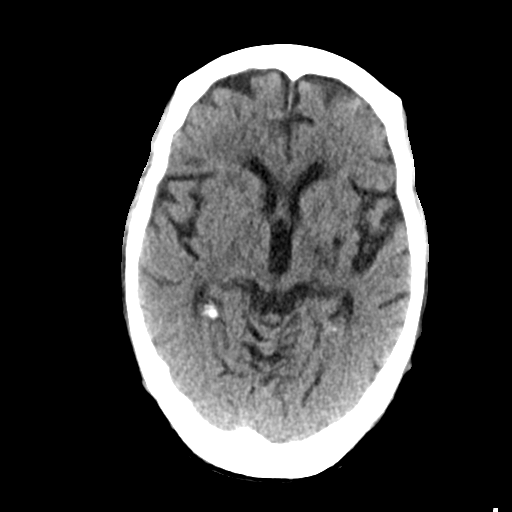
[im 16/31  brain]
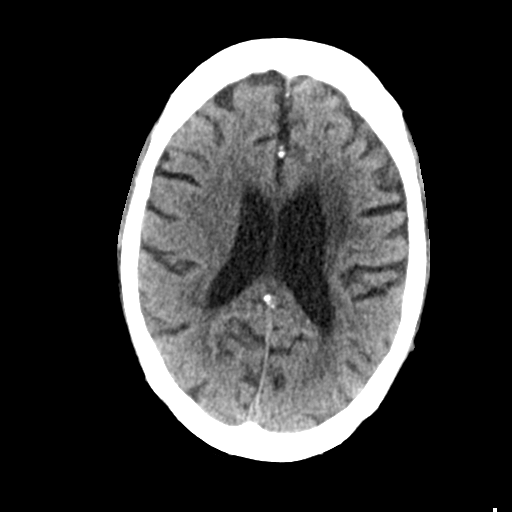
[im 19/31  brain]
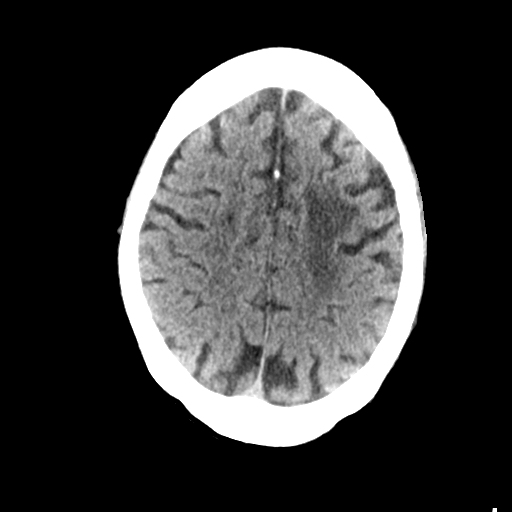
[im 19/31  bone]
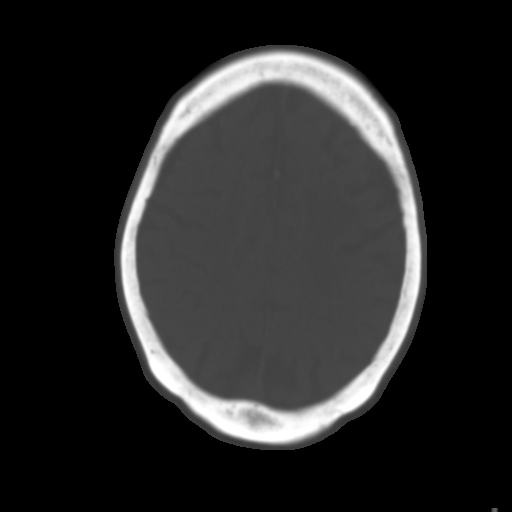
[im 23/31  brain]
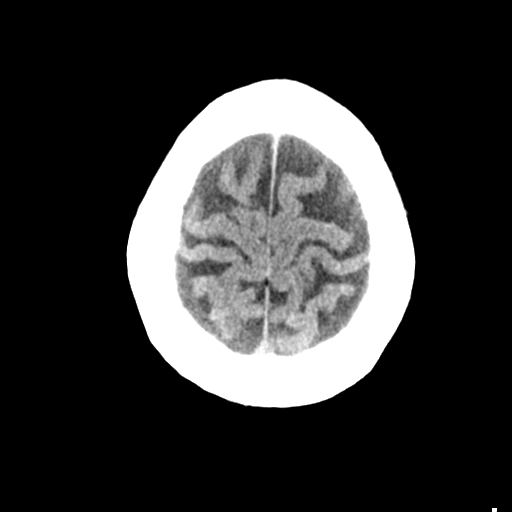
[im 27/31  brain]
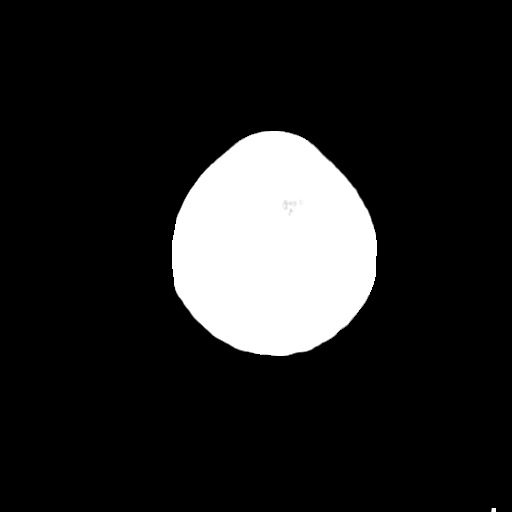

[Series 3: head bone · axial · 0.44mm/px · z∈[-118,-64]mm · 4 of 77 slices shown]
[im 8/77  bone]
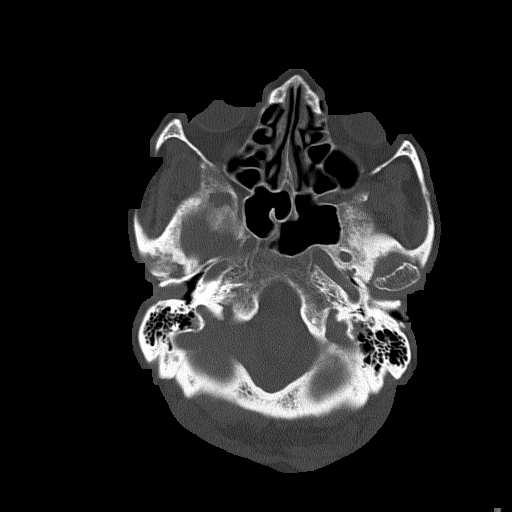
[im 16/77  bone]
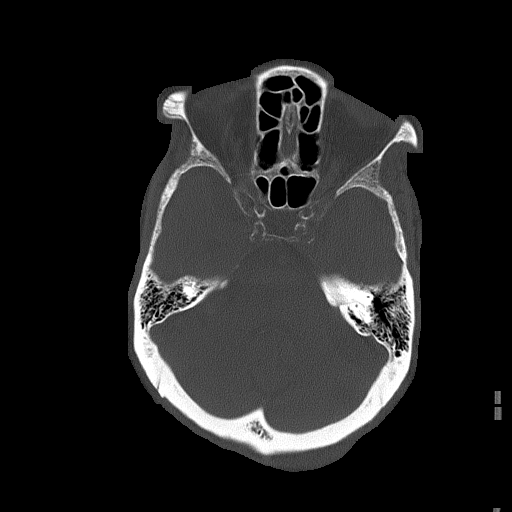
[im 23/77  bone]
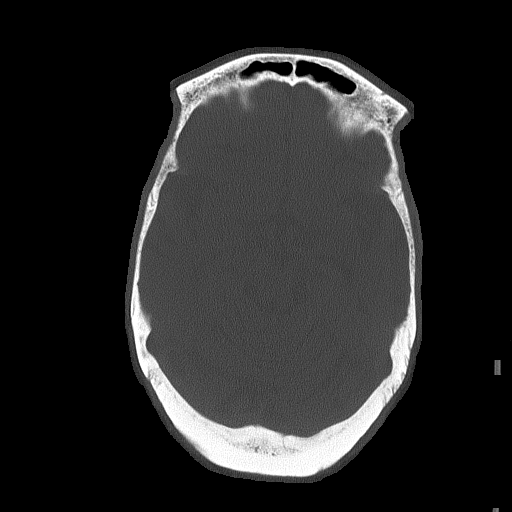
[im 35/77  bone]
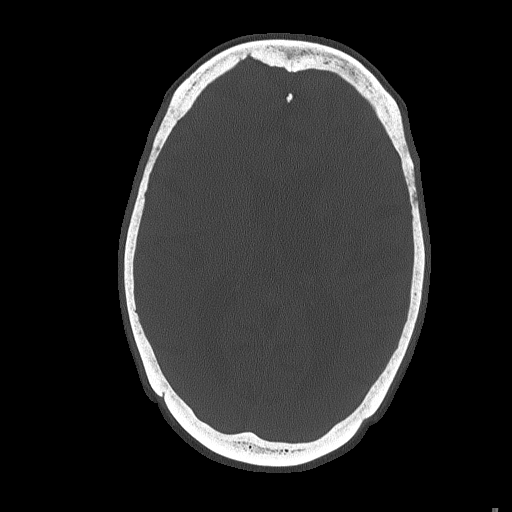

[Series 4: head without cor · coronal · non-contrast · 0.28mm/px · 3 of 66 slices shown]
[im 22/66  brain]
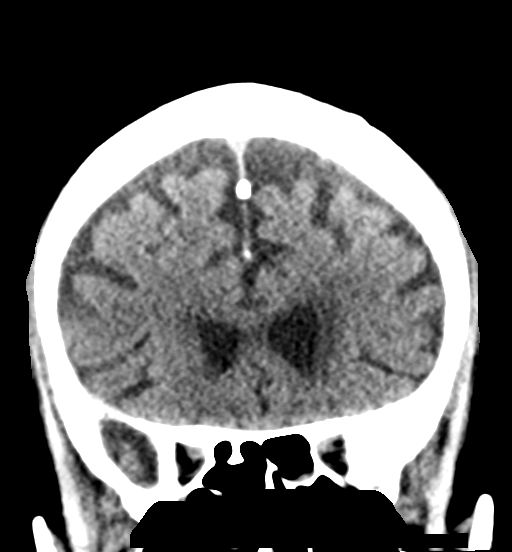
[im 29/66  brain]
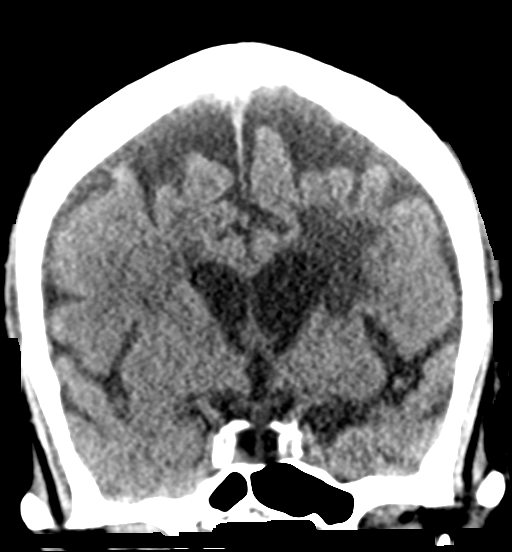
[im 37/66  brain]
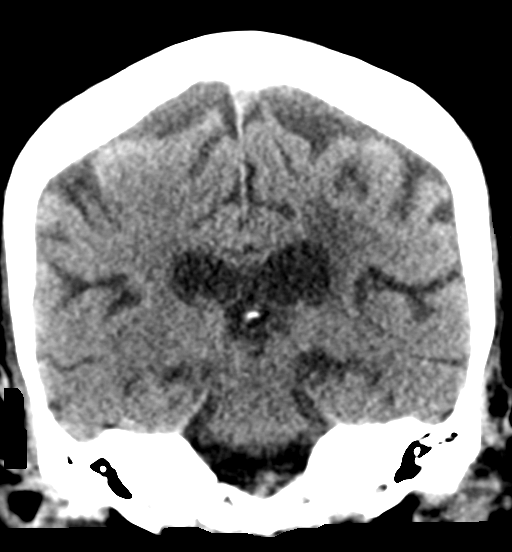

[Series 5: head without sag · sagittal · non-contrast · 0.30mm/px · 3 of 66 slices shown]
[im 22/66  brain]
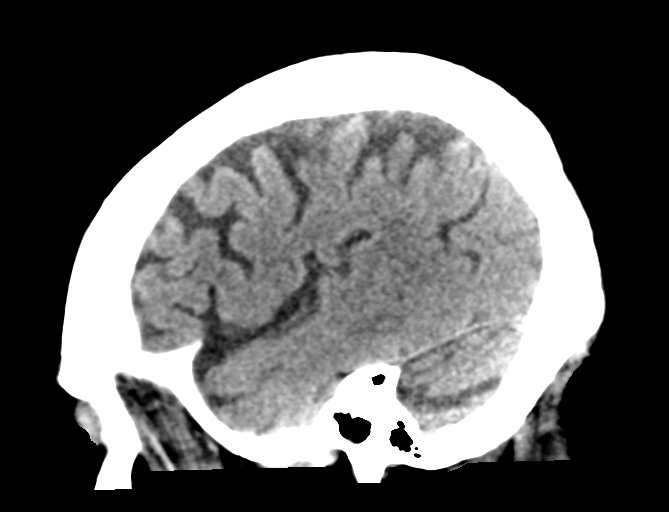
[im 33/66  brain]
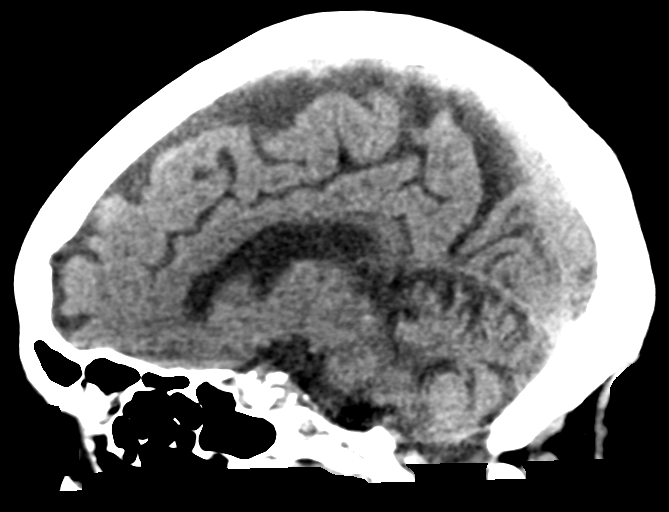
[im 44/66  brain]
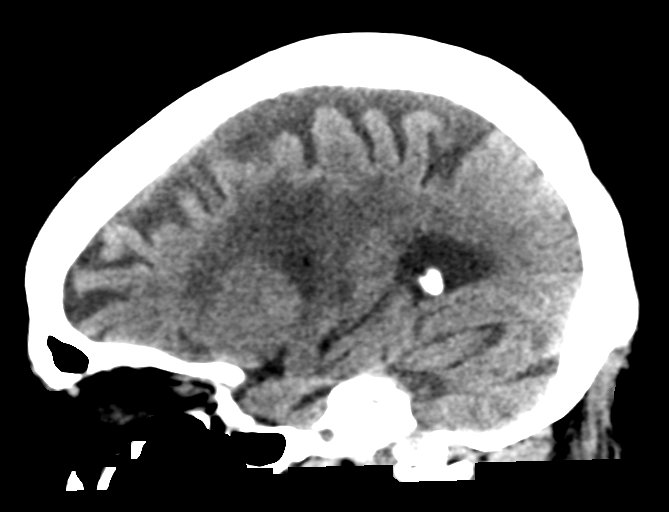

[17 of 47 positions shown; findings below may reference images not displayed]

FINDINGS: Brain: Moderate atrophy. Chronic infarct left basal ganglia
unchanged. Chronic infarct left lateral cerebellum unchanged.
Chronic microvascular ischemic change in the white matter.

Negative for acute infarct.  Negative for hemorrhage or mass.

Vascular: No hyperdense vessel or unexpected calcification.

Skull: Negative

Sinuses/Orbits: Negative

Other: None
IMPRESSION: Atrophy and chronic ischemic changes most notably in the deep white
matter on the left. No acute abnormality.

## 2018-05-19 DIAGNOSIS — I82409 Acute embolism and thrombosis of unspecified deep veins of unspecified lower extremity: Secondary | ICD-10-CM | POA: Diagnosis not present

## 2018-05-19 DIAGNOSIS — Z6825 Body mass index (BMI) 25.0-25.9, adult: Secondary | ICD-10-CM | POA: Diagnosis not present

## 2018-05-19 DIAGNOSIS — Z299 Encounter for prophylactic measures, unspecified: Secondary | ICD-10-CM | POA: Diagnosis not present

## 2018-05-19 DIAGNOSIS — Z713 Dietary counseling and surveillance: Secondary | ICD-10-CM | POA: Diagnosis not present

## 2018-05-26 DIAGNOSIS — I639 Cerebral infarction, unspecified: Secondary | ICD-10-CM | POA: Diagnosis not present

## 2018-05-26 DIAGNOSIS — E7800 Pure hypercholesterolemia, unspecified: Secondary | ICD-10-CM | POA: Diagnosis not present

## 2018-05-26 DIAGNOSIS — I10 Essential (primary) hypertension: Secondary | ICD-10-CM | POA: Diagnosis not present

## 2018-06-02 ENCOUNTER — Ambulatory Visit (INDEPENDENT_AMBULATORY_CARE_PROVIDER_SITE_OTHER): Payer: Medicare Other | Admitting: Urology

## 2018-06-02 DIAGNOSIS — N3941 Urge incontinence: Secondary | ICD-10-CM

## 2018-06-12 ENCOUNTER — Encounter: Payer: Self-pay | Admitting: *Deleted

## 2018-06-12 ENCOUNTER — Ambulatory Visit: Payer: Medicare Other | Admitting: Diagnostic Neuroimaging

## 2018-06-12 ENCOUNTER — Ambulatory Visit (INDEPENDENT_AMBULATORY_CARE_PROVIDER_SITE_OTHER): Payer: Medicare Other | Admitting: Diagnostic Neuroimaging

## 2018-06-12 VITALS — BP 110/62 | Wt 157.6 lb

## 2018-06-12 DIAGNOSIS — H532 Diplopia: Secondary | ICD-10-CM

## 2018-06-12 DIAGNOSIS — F0281 Dementia in other diseases classified elsewhere with behavioral disturbance: Secondary | ICD-10-CM

## 2018-06-12 DIAGNOSIS — G3183 Dementia with Lewy bodies: Secondary | ICD-10-CM

## 2018-06-12 NOTE — Patient Instructions (Addendum)
DEMENTIA with behavioral disturbances (new problem) - continue supportive care; safety, supervision; caregiver resources provider - limited role for aricept / namenda at this time given overall functional status and age - continue abilify (per PCP for hallucinations)  TRANSIENT DOUBLE VISION (new problem) - unclear etiology; now resolved; continue medical stroke prevention  STROKE PREVENTION (new problem) - currently on aggrenox for stroke prevention and warfarin for DVT treatment / prevention; could consider to stop aggrenox and continue warfarin alone for stroke prevention, and to avoid excessive bleeding risk - continue BP control

## 2018-06-12 NOTE — Progress Notes (Signed)
[facility]  PATIENT: Jaclyn Love DOB: 12/08/1930  REFERRING CLINICIAN: D Vyas HISTORY FROM: patient  REASON FOR VISIT: new consult    HISTORICAL  CHIEF COMPLAINT:  Chief Complaint  Patient presents with  . New Patient (Initial Visit)    Pt family reports a gradual decrease in patients memory. She has confusion and hallucinations    HISTORY OF PRESENT ILLNESS:   84 year old female here for evaluation of double vision.  Patient has history hypertension, stroke, hypercholesteremia.  Reports being referred here because of problems with blurred vision.  She remembers going to the eye doctor and having new glasses ordered.  She does not use these regularly.  Otherwise patient is not sure about this evaluation.  According to family patient has had 2-year progressive visual hallucinations, especially waking up at nighttime and seeing people, children, babies.  Sometimes this upsets her.  Patient was started on Abilify by PCP.  Patient was diagnosed with possible mild dementia by PCP.  Patient having 1 to 2 years of gradual onset progressive short-term memory loss, confusion, disorientation.  Patient has had significant decline in activities of daily living since 2006 when she had a stroke resulting in right hemiparesis.  However since that time she has continued to decline in the past 1 to 2 years more from a cognitive deficit standpoint.  Patient having more problems with balance and walking.  Patient lives with her son.  They also have sitters and aids that help during the daytime.     REVIEW OF SYSTEMS: Full 14 system review of systems performed and negative with exception of: Loss of vision incontinence urination problems decreased energy depression weakness memory loss confusion.  ALLERGIES: Allergies  Allergen Reactions  . Ace Inhibitors Anaphylaxis  . Rocephin [Ceftriaxone Sodium In Dextrose] Anaphylaxis     Facial swelling - Unsure of penicillins,  did get dose of Amoxicillin and currently has swelling  . Bentyl [Dicyclomine Hcl]     Ulcer drugs- fatigue, hallucination  . Fish Allergy Swelling  . Phenergan [Promethazine Hcl] Other (See Comments)    Cns effects/behavioral  . Risperdal [Risperidone]     Fatigue, drowsiness  . Septra [Sulfamethoxazole-Trimethoprim] Other (See Comments)    Intolerance - question of swelling  . Vasotec [Enalapril Maleate] Swelling  . Xanax [Alprazolam]     Mood tolerance  . Clonidine Derivatives Rash    Rash attributed to glue on the clonidine patch  08/27/16 the patient is on oral Catapres without issue    HOME MEDICATIONS: Outpatient Medications Prior to Visit  Medication Sig Dispense Refill  . allopurinol (ZYLOPRIM) 100 MG tablet Take 100 mg by mouth daily.    Marland Kitchen. amLODipine (NORVASC) 10 MG tablet Take 10 mg by mouth daily.    . ARIPiprazole (ABILIFY) 5 MG tablet Take 2.5 mg by mouth daily.    Marland Kitchen. buPROPion (WELLBUTRIN XL) 150 MG 24 hr tablet Take 1 tablet (150 mg total) by mouth daily. 30 tablet 0  . Calcium 500 MG tablet Take one tablet by mouth twice daily 60 tablet 0  . carvedilol (COREG) 12.5 MG tablet Take 1 tablet (12.5 mg total) by mouth 2 (two) times daily with a meal. 60 tablet 0  . cloNIDine (CATAPRES) 0.2 MG tablet Take 1 tablet (0.2 mg total) by mouth 2 (two) times daily. 60 tablet 0  . dipyridamole-aspirin (AGGRENOX) 200-25 MG 12hr capsule Take 1 capsule by mouth daily.    . dorzolamide-timolol (COSOPT) 22.3-6.8 MG/ML ophthalmic solution Place 1 drop into the  left eye daily. 10 mL 0  . ferrous sulfate 325 (65 FE) MG tablet Take 1 tablet (325 mg total) by mouth daily with breakfast. 30 tablet 0  . furosemide (LASIX) 20 MG tablet Take 1 tablet (20 mg total) by mouth daily with lunch. 30 tablet 0  . furosemide (LASIX) 40 MG tablet Take 1 tablet (40 mg total) by mouth daily. 30 tablet 0  . latanoprost (XALATAN) 0.005 % ophthalmic solution Place 1 drop into the left eye at bedtime. 2.5 mL 0    . levothyroxine (SYNTHROID, LEVOTHROID) 25 MCG tablet Take 25 mcg by mouth daily before breakfast.    . methocarbamol (ROBAXIN) 500 MG tablet Take 500 mg by mouth 2 (two) times daily.    . minoxidil (LONITEN) 2.5 MG tablet Take 1 tablet (2.5 mg total) by mouth daily. 30 tablet 0  . Multiple Vitamin (MULTIVITAMIN) tablet Take 1 tablet by mouth daily.    . potassium chloride SA (K-DUR,KLOR-CON) 20 MEQ tablet Take 20 mEq by mouth daily.    . simvastatin (ZOCOR) 20 MG tablet Take 1 tablet (20 mg total) by mouth at bedtime. 30 tablet 0  . telmisartan (MICARDIS) 80 MG tablet Take 80 mg by mouth daily.    . traMADol (ULTRAM) 50 MG tablet Take 1 tablet (50 mg total) by mouth every 6 (six) hours as needed. 30 tablet 0  . Vitamin D, Ergocalciferol, (DRISDOL) 50000 units CAPS capsule Take 50,000 Units by mouth every 7 (seven) days.    Marland Kitchen warfarin (COUMADIN) 5 MG tablet Take 1 tablet (5 mg total) by mouth daily. (Patient taking differently: Take 5-7.5 mg by mouth See admin instructions. Pt takes 7.5mg  on Mondays and Thursdays - takes 5mg  all other days) 30 tablet 0   No facility-administered medications prior to visit.     PAST MEDICAL HISTORY: Past Medical History:  Diagnosis Date  . Chronic diastolic heart failure (HCC)   . CKD (chronic kidney disease)   . CVA (cerebral vascular accident) (HCC) 11/2004  . GERD (gastroesophageal reflux disease)   . Glaucoma   . Hepatic cyst 2017   7 cm hepatic cyst  . Hiatal hernia   . History of deep vein thrombosis (DVT) of lower extremity    One episode; on warfarin since  . Hyperlipidemia   . Hypertension   . Osteoarthritis   . Right hemiplegia (HCC)   . Vitamin D deficiency     PAST SURGICAL HISTORY: Past Surgical History:  Procedure Laterality Date  . CATARACT EXTRACTION Bilateral 11/2009  . CHOLECYSTECTOMY  1985  . CYSTOSCOPY  06/16/2017   Procedure: CYSTOSCOPY;  Surgeon: Malen Gauze, MD;  Location: [facility];  Service: Urology;;  . INSERTION  OF SUPRAPUBIC CATHETER N/A 06/16/2017   Procedure: INSERTION OF SUPRAPUBIC CATHETER (22FR 2Way 20ml Balloon);  Surgeon: Malen Gauze, MD;  Location: [facility];  Service: Urology;  Laterality: N/A;  . LAPAROSCOPIC LYSIS OF ADHESIONS  2006  . TOTAL ABDOMINAL HYSTERECTOMY  1975    FAMILY HISTORY: Family History  Problem Relation Age of Onset  . Dementia Mother   . Dementia Sister   . Cancer Neg Hx   . Heart disease Neg Hx   . Stroke Neg Hx   . Diabetes Neg Hx     SOCIAL HISTORY: Social History   Socioeconomic History  . Marital status: Widowed    Spouse name: Not on file  . Number of children: Not on file  . Years of education: Not on file  .  Highest education level: Not on file  Occupational History  . Occupation: Retired  Engineer, production  . Financial resource strain: Not on file  . Food insecurity:    Worry: Not on file    Inability: Not on file  . Transportation needs:    Medical: Not on file    Non-medical: Not on file  Tobacco Use  . Smoking status: Never Smoker  . Smokeless tobacco: Never Used  Substance and Sexual Activity  . Alcohol use: No  . Drug use: No  . Sexual activity: Never  Lifestyle  . Physical activity:    Days per week: Not on file    Minutes per session: Not on file  . Stress: Not on file  Relationships  . Social connections:    Talks on phone: Not on file    Gets together: Not on file    Attends religious service: Not on file    Active member of club or organization: Not on file    Attends meetings of clubs or organizations: Not on file    Relationship status: Not on file  . Intimate partner violence:    Fear of current or ex partner: Not on file    Emotionally abused: Not on file    Physically abused: Not on file    Forced sexual activity: Not on file  Other Topics Concern  . Not on file  Social History Narrative   Patient lives at home with son.    Education level: HS diploma   5 Children, 3 living     PHYSICAL EXAM  GENERAL  EXAM/CONSTITUTIONAL: Vitals:  Vitals:   06/12/18 1012  BP: 110/62  Weight: 157 lb 9.6 oz (71.5 kg)     Body mass index is 23.96 kg/m. Wt Readings from Last 3 Encounters:  06/12/18 157 lb 9.6 oz (71.5 kg)  06/10/17 165 lb (74.8 kg)  09/18/16 171 lb (77.6 kg)     Patient is in no distress; well developed, nourished and groomed; neck is supple  CARDIOVASCULAR:  Examination of carotid arteries is normal; no carotid bruits  Regular rate and rhythm, no murmurs  Examination of peripheral vascular system by observation and palpation is normal  EYES:  Ophthalmoscopic exam of optic discs and posterior segments is normal; no papilledema or hemorrhages  No exam data present  MUSCULOSKELETAL:  Gait, strength, tone, movements noted in Neurologic exam below  NEUROLOGIC: MENTAL STATUS:  MMSE - Mini Mental State Exam 06/12/2018  Orientation to time 4  Orientation to Place 2  Registration 3  Attention/ Calculation 3  Recall 3  Language- name 2 objects 2  Language- repeat 1  Language- follow 3 step command 2  Language- read & follow direction 1  Write a sentence 0  Copy design 0  Total score 21    awake, alert, oriented to person  [facility] memory  DECR attention and concentration  language fluent, comprehension intact, naming intact  fund of knowledge appropriate  DECR INSIGHT  CRANIAL NERVE:   2nd - no papilledema on fundoscopic exam  2nd, 3rd, 4th, 6th - pupils equal and reactive to light, visual fields full to confrontation, extraocular muscles intact, no nystagmus  5th - facial sensation symmetric  7th - facial strength symmetric  8th - hearing intact  9th - palate elevates symmetrically, uvula midline  11th - shoulder shrug symmetric  12th - tongue protrusion midline  MOTOR:   INCREASED TONE IN RUE AND RLE   LUE 4; LLE 3  RUE:  PROX 1, DISTAL 2-3  RLE: PROX 1-2, DISTAL 1-2; LOWER EXT BRACE  SENSORY:   normal and symmetric to light touch;  EXCEPT DECR IN RIGHT LEG  COORDINATION:   finger-nose-finger, fine finger movements SLOW  REFLEXES:   deep tendon reflexes TRACE and symmetric  GAIT/STATION:   IN WHEEL CHAIR; STOOPED POSTURE; CANNOT STAND UNASSISTED     DIAGNOSTIC DATA (LABS, IMAGING, TESTING) - I reviewed patient records, labs, notes, testing and imaging myself where available.  Lab Results  Component Value Date   WBC 13.7 (H) 06/10/2017   HGB 10.7 (L) 06/10/2017   HCT 30.9 (L) 06/10/2017   MCV 93.1 06/10/2017   PLT 191 06/10/2017      Component Value Date/Time   NA 138 06/10/2017 0904   NA 138 08/31/2016   K 4.5 06/10/2017 0904   CL 106 06/10/2017 0904   CO2 24 06/10/2017 0904   GLUCOSE 112 (H) 06/10/2017 0904   BUN 28 (H) 06/10/2017 0904   BUN 18 08/31/2016   CREATININE 1.78 (H) 06/10/2017 0904   CALCIUM 9.1 06/10/2017 0904   PROT 5.9 (L) 09/08/2016 0621   ALBUMIN 1.9 (L) 09/08/2016 0621   AST 32 09/08/2016 0621   ALT 28 09/08/2016 0621   ALKPHOS 59 09/08/2016 0621   BILITOT 0.2 (L) 09/08/2016 0621   GFRNONAA 25 (L) 06/10/2017 0904   GFRAA 29 (L) 06/10/2017 0904   No results found for: CHOL, HDL, LDLCALC, LDLDIRECT, TRIG, CHOLHDL No results found for: EAVW0J No results found for: VITAMINB12 No results found for: TSH   09/07/16 CT head [I reviewed images myself and agree with interpretation. -VRP]  - Atrophy and chronic ischemic changes most notably in the deep white matter on the left. No acute abnormality.  12/30/17 MRI brain [I reviewed images myself and agree with interpretation. Moderate atrophy and moderate chronic small vessel ischemic disease. -VRP]  1. No evidence of acute or subacute infarct. 2. Chronic left basal ganglia and cerebellar infarcts. 3. Moderate chronic small vessel ischemic disease and cerebral atrophy.    ASSESSMENT AND PLAN  84 y.o. year old female here with progressive short-term memory loss, confusion, visual hallucinations, also with transient double  vision and loss of vision.  Likely represents neurodegenerative dementia, likely dementia with Lewy bodies.  Other forms of dementia are also possible.  Patient may also have had transient visual loss related to TIA or other vascular event.  Patient currently on stroke risk factor management including Aggrenox, blood pressure control, as well as warfarin for DVT prevention.    Dx: mild dementia with behavior disturbance (lewy body vs alzheimers vs vascular)  1. Lewy body dementia with behavioral disturbance      PLAN:  MILD DEMENTIA with behavioral disturbances (new problem, no workup) - continue supportive care; safety, supervision; caregiver resources provider - limited role for aricept / namenda at this time given overall functional status and age - continue abilify (per PCP for hallucinations)  TRANSIENT DOUBLE VISION (new problem) - unclear etiology; now resolved; continue medical stroke prevention  STROKE PREVENTION (new problem) - currently on aggrenox for stroke prevention and warfarin for DVT treatment / prevention; could consider to stop aggrenox and continue warfarin alone for stroke prevention, and to avoid excessive bleeding risk - continue BP control  Return if symptoms worsen or fail to improve, for return to PCP.    Suanne Marker, MD 06/12/2018, 10:46 AM Certified in Neurology, Neurophysiology and Neuroimaging  [facility] [address] [place],  [place] [address] [phone]

## 2018-06-16 DIAGNOSIS — E663 Overweight: Secondary | ICD-10-CM | POA: Diagnosis not present

## 2018-06-16 DIAGNOSIS — Z713 Dietary counseling and surveillance: Secondary | ICD-10-CM | POA: Diagnosis not present

## 2018-06-16 DIAGNOSIS — Z299 Encounter for prophylactic measures, unspecified: Secondary | ICD-10-CM | POA: Diagnosis not present

## 2018-06-16 DIAGNOSIS — I82409 Acute embolism and thrombosis of unspecified deep veins of unspecified lower extremity: Secondary | ICD-10-CM | POA: Diagnosis not present

## 2018-06-16 DIAGNOSIS — I10 Essential (primary) hypertension: Secondary | ICD-10-CM | POA: Diagnosis not present

## 2018-06-19 DIAGNOSIS — I639 Cerebral infarction, unspecified: Secondary | ICD-10-CM | POA: Diagnosis not present

## 2018-06-19 DIAGNOSIS — I10 Essential (primary) hypertension: Secondary | ICD-10-CM | POA: Diagnosis not present

## 2018-06-19 DIAGNOSIS — E7800 Pure hypercholesterolemia, unspecified: Secondary | ICD-10-CM | POA: Diagnosis not present

## 2018-06-30 ENCOUNTER — Ambulatory Visit (INDEPENDENT_AMBULATORY_CARE_PROVIDER_SITE_OTHER): Payer: Medicare Other | Admitting: Urology

## 2018-06-30 DIAGNOSIS — R339 Retention of urine, unspecified: Secondary | ICD-10-CM

## 2018-07-14 DIAGNOSIS — Z299 Encounter for prophylactic measures, unspecified: Secondary | ICD-10-CM | POA: Diagnosis not present

## 2018-07-14 DIAGNOSIS — Z713 Dietary counseling and surveillance: Secondary | ICD-10-CM | POA: Diagnosis not present

## 2018-07-14 DIAGNOSIS — N390 Urinary tract infection, site not specified: Secondary | ICD-10-CM | POA: Diagnosis not present

## 2018-07-14 DIAGNOSIS — G8191 Hemiplegia, unspecified affecting right dominant side: Secondary | ICD-10-CM | POA: Diagnosis not present

## 2018-07-14 DIAGNOSIS — I82409 Acute embolism and thrombosis of unspecified deep veins of unspecified lower extremity: Secondary | ICD-10-CM | POA: Diagnosis not present

## 2018-07-17 DIAGNOSIS — I639 Cerebral infarction, unspecified: Secondary | ICD-10-CM | POA: Diagnosis not present

## 2018-07-17 DIAGNOSIS — E7800 Pure hypercholesterolemia, unspecified: Secondary | ICD-10-CM | POA: Diagnosis not present

## 2018-07-17 DIAGNOSIS — I10 Essential (primary) hypertension: Secondary | ICD-10-CM | POA: Diagnosis not present

## 2018-08-04 ENCOUNTER — Ambulatory Visit (INDEPENDENT_AMBULATORY_CARE_PROVIDER_SITE_OTHER): Payer: Medicare Other | Admitting: Urology

## 2018-08-04 DIAGNOSIS — R339 Retention of urine, unspecified: Secondary | ICD-10-CM | POA: Diagnosis not present

## 2018-08-11 DIAGNOSIS — I82409 Acute embolism and thrombosis of unspecified deep veins of unspecified lower extremity: Secondary | ICD-10-CM | POA: Diagnosis not present

## 2018-08-11 DIAGNOSIS — Z299 Encounter for prophylactic measures, unspecified: Secondary | ICD-10-CM | POA: Diagnosis not present

## 2018-08-11 DIAGNOSIS — Z6825 Body mass index (BMI) 25.0-25.9, adult: Secondary | ICD-10-CM | POA: Diagnosis not present

## 2018-08-11 DIAGNOSIS — I10 Essential (primary) hypertension: Secondary | ICD-10-CM | POA: Diagnosis not present

## 2018-08-11 DIAGNOSIS — Z23 Encounter for immunization: Secondary | ICD-10-CM | POA: Diagnosis not present

## 2018-08-11 DIAGNOSIS — G8191 Hemiplegia, unspecified affecting right dominant side: Secondary | ICD-10-CM | POA: Diagnosis not present

## 2018-08-21 DIAGNOSIS — I10 Essential (primary) hypertension: Secondary | ICD-10-CM | POA: Diagnosis not present

## 2018-08-21 DIAGNOSIS — I639 Cerebral infarction, unspecified: Secondary | ICD-10-CM | POA: Diagnosis not present

## 2018-08-21 DIAGNOSIS — E7800 Pure hypercholesterolemia, unspecified: Secondary | ICD-10-CM | POA: Diagnosis not present

## 2018-09-01 ENCOUNTER — Ambulatory Visit (INDEPENDENT_AMBULATORY_CARE_PROVIDER_SITE_OTHER): Payer: Medicare Other | Admitting: Urology

## 2018-09-01 DIAGNOSIS — R339 Retention of urine, unspecified: Secondary | ICD-10-CM

## 2018-09-01 DIAGNOSIS — N3941 Urge incontinence: Secondary | ICD-10-CM

## 2018-09-14 DIAGNOSIS — I10 Essential (primary) hypertension: Secondary | ICD-10-CM | POA: Diagnosis not present

## 2018-09-14 DIAGNOSIS — I639 Cerebral infarction, unspecified: Secondary | ICD-10-CM | POA: Diagnosis not present

## 2018-09-14 DIAGNOSIS — E7800 Pure hypercholesterolemia, unspecified: Secondary | ICD-10-CM | POA: Diagnosis not present

## 2018-09-15 DIAGNOSIS — H401112 Primary open-angle glaucoma, right eye, moderate stage: Secondary | ICD-10-CM | POA: Diagnosis not present

## 2018-09-15 DIAGNOSIS — H401123 Primary open-angle glaucoma, left eye, severe stage: Secondary | ICD-10-CM | POA: Diagnosis not present

## 2018-09-15 DIAGNOSIS — H532 Diplopia: Secondary | ICD-10-CM | POA: Diagnosis not present

## 2018-09-15 DIAGNOSIS — Z961 Presence of intraocular lens: Secondary | ICD-10-CM | POA: Diagnosis not present

## 2018-09-22 DIAGNOSIS — I82409 Acute embolism and thrombosis of unspecified deep veins of unspecified lower extremity: Secondary | ICD-10-CM | POA: Diagnosis not present

## 2018-09-22 DIAGNOSIS — Z299 Encounter for prophylactic measures, unspecified: Secondary | ICD-10-CM | POA: Diagnosis not present

## 2018-09-22 DIAGNOSIS — G8191 Hemiplegia, unspecified affecting right dominant side: Secondary | ICD-10-CM | POA: Diagnosis not present

## 2018-09-22 DIAGNOSIS — Z6825 Body mass index (BMI) 25.0-25.9, adult: Secondary | ICD-10-CM | POA: Diagnosis not present

## 2018-09-22 DIAGNOSIS — I10 Essential (primary) hypertension: Secondary | ICD-10-CM | POA: Diagnosis not present

## 2018-10-06 ENCOUNTER — Ambulatory Visit (INDEPENDENT_AMBULATORY_CARE_PROVIDER_SITE_OTHER): Payer: Medicare Other | Admitting: Urology

## 2018-10-06 DIAGNOSIS — N3941 Urge incontinence: Secondary | ICD-10-CM

## 2018-10-06 DIAGNOSIS — R339 Retention of urine, unspecified: Secondary | ICD-10-CM | POA: Diagnosis not present

## 2018-10-13 DIAGNOSIS — E7800 Pure hypercholesterolemia, unspecified: Secondary | ICD-10-CM | POA: Diagnosis not present

## 2018-10-13 DIAGNOSIS — I10 Essential (primary) hypertension: Secondary | ICD-10-CM | POA: Diagnosis not present

## 2018-10-13 DIAGNOSIS — I639 Cerebral infarction, unspecified: Secondary | ICD-10-CM | POA: Diagnosis not present

## 2018-10-28 ENCOUNTER — Ambulatory Visit (INDEPENDENT_AMBULATORY_CARE_PROVIDER_SITE_OTHER): Payer: Medicare Other | Admitting: Urology

## 2018-10-28 DIAGNOSIS — R339 Retention of urine, unspecified: Secondary | ICD-10-CM | POA: Diagnosis not present

## 2018-10-28 DIAGNOSIS — N3941 Urge incontinence: Secondary | ICD-10-CM | POA: Diagnosis not present

## 2018-11-20 DIAGNOSIS — Z7901 Long term (current) use of anticoagulants: Secondary | ICD-10-CM | POA: Diagnosis not present

## 2018-11-20 DIAGNOSIS — W182XXA Fall in (into) shower or empty bathtub, initial encounter: Secondary | ICD-10-CM | POA: Diagnosis not present

## 2018-11-20 DIAGNOSIS — S4291XA Fracture of right shoulder girdle, part unspecified, initial encounter for closed fracture: Secondary | ICD-10-CM | POA: Diagnosis not present

## 2018-11-20 DIAGNOSIS — W1839XA Other fall on same level, initial encounter: Secondary | ICD-10-CM | POA: Diagnosis not present

## 2018-11-20 DIAGNOSIS — Y92002 Bathroom of unspecified non-institutional (private) residence single-family (private) house as the place of occurrence of the external cause: Secondary | ICD-10-CM | POA: Diagnosis not present

## 2018-11-20 DIAGNOSIS — Y998 Other external cause status: Secondary | ICD-10-CM | POA: Diagnosis not present

## 2018-11-20 DIAGNOSIS — M79601 Pain in right arm: Secondary | ICD-10-CM | POA: Diagnosis not present

## 2018-11-20 DIAGNOSIS — S42291A Other displaced fracture of upper end of right humerus, initial encounter for closed fracture: Secondary | ICD-10-CM | POA: Diagnosis not present

## 2018-11-20 DIAGNOSIS — I878 Other specified disorders of veins: Secondary | ICD-10-CM | POA: Diagnosis not present

## 2018-11-20 DIAGNOSIS — Z79899 Other long term (current) drug therapy: Secondary | ICD-10-CM | POA: Diagnosis not present

## 2018-11-20 DIAGNOSIS — S42211A Unspecified displaced fracture of surgical neck of right humerus, initial encounter for closed fracture: Secondary | ICD-10-CM | POA: Diagnosis not present

## 2018-11-20 DIAGNOSIS — M1611 Unilateral primary osteoarthritis, right hip: Secondary | ICD-10-CM | POA: Diagnosis not present

## 2018-11-20 DIAGNOSIS — Z8673 Personal history of transient ischemic attack (TIA), and cerebral infarction without residual deficits: Secondary | ICD-10-CM | POA: Diagnosis not present

## 2018-11-20 DIAGNOSIS — I10 Essential (primary) hypertension: Secondary | ICD-10-CM | POA: Diagnosis not present

## 2018-11-20 DIAGNOSIS — F329 Major depressive disorder, single episode, unspecified: Secondary | ICD-10-CM | POA: Diagnosis not present

## 2018-11-20 DIAGNOSIS — Z86718 Personal history of other venous thrombosis and embolism: Secondary | ICD-10-CM | POA: Diagnosis not present

## 2018-11-20 DIAGNOSIS — W19XXXA Unspecified fall, initial encounter: Secondary | ICD-10-CM | POA: Diagnosis not present

## 2018-11-20 DIAGNOSIS — E0590 Thyrotoxicosis, unspecified without thyrotoxic crisis or storm: Secondary | ICD-10-CM | POA: Diagnosis not present

## 2018-11-20 DIAGNOSIS — M8580 Other specified disorders of bone density and structure, unspecified site: Secondary | ICD-10-CM | POA: Diagnosis not present

## 2018-11-20 DIAGNOSIS — M533 Sacrococcygeal disorders, not elsewhere classified: Secondary | ICD-10-CM | POA: Diagnosis not present

## 2018-11-20 DIAGNOSIS — M1711 Unilateral primary osteoarthritis, right knee: Secondary | ICD-10-CM | POA: Diagnosis not present

## 2018-11-23 DIAGNOSIS — I10 Essential (primary) hypertension: Secondary | ICD-10-CM | POA: Diagnosis not present

## 2018-11-23 DIAGNOSIS — Z6825 Body mass index (BMI) 25.0-25.9, adult: Secondary | ICD-10-CM | POA: Diagnosis not present

## 2018-11-23 DIAGNOSIS — Z299 Encounter for prophylactic measures, unspecified: Secondary | ICD-10-CM | POA: Diagnosis not present

## 2018-11-23 DIAGNOSIS — G8191 Hemiplegia, unspecified affecting right dominant side: Secondary | ICD-10-CM | POA: Diagnosis not present

## 2018-11-23 DIAGNOSIS — S4291XA Fracture of right shoulder girdle, part unspecified, initial encounter for closed fracture: Secondary | ICD-10-CM | POA: Diagnosis not present

## 2018-11-25 DIAGNOSIS — I639 Cerebral infarction, unspecified: Secondary | ICD-10-CM | POA: Diagnosis not present

## 2018-11-25 DIAGNOSIS — E7800 Pure hypercholesterolemia, unspecified: Secondary | ICD-10-CM | POA: Diagnosis not present

## 2018-11-25 DIAGNOSIS — I10 Essential (primary) hypertension: Secondary | ICD-10-CM | POA: Diagnosis not present

## 2018-11-27 ENCOUNTER — Ambulatory Visit (INDEPENDENT_AMBULATORY_CARE_PROVIDER_SITE_OTHER): Payer: Medicare Other | Admitting: Urology

## 2018-11-27 DIAGNOSIS — S42201D Unspecified fracture of upper end of right humerus, subsequent encounter for fracture with routine healing: Secondary | ICD-10-CM | POA: Diagnosis not present

## 2018-11-27 DIAGNOSIS — R339 Retention of urine, unspecified: Secondary | ICD-10-CM | POA: Diagnosis not present

## 2018-11-27 DIAGNOSIS — S42201A Unspecified fracture of upper end of right humerus, initial encounter for closed fracture: Secondary | ICD-10-CM | POA: Diagnosis not present

## 2018-11-29 DIAGNOSIS — H409 Unspecified glaucoma: Secondary | ICD-10-CM | POA: Diagnosis present

## 2018-11-29 DIAGNOSIS — Z7901 Long term (current) use of anticoagulants: Secondary | ICD-10-CM | POA: Diagnosis not present

## 2018-11-29 DIAGNOSIS — E860 Dehydration: Secondary | ICD-10-CM | POA: Diagnosis not present

## 2018-11-29 DIAGNOSIS — Z936 Other artificial openings of urinary tract status: Secondary | ICD-10-CM | POA: Diagnosis not present

## 2018-11-29 DIAGNOSIS — S42301A Unspecified fracture of shaft of humerus, right arm, initial encounter for closed fracture: Secondary | ICD-10-CM | POA: Diagnosis not present

## 2018-11-29 DIAGNOSIS — D631 Anemia in chronic kidney disease: Secondary | ICD-10-CM | POA: Diagnosis not present

## 2018-11-29 DIAGNOSIS — S42201A Unspecified fracture of upper end of right humerus, initial encounter for closed fracture: Secondary | ICD-10-CM | POA: Diagnosis present

## 2018-11-29 DIAGNOSIS — N189 Chronic kidney disease, unspecified: Secondary | ICD-10-CM | POA: Diagnosis not present

## 2018-11-29 DIAGNOSIS — Z86718 Personal history of other venous thrombosis and embolism: Secondary | ICD-10-CM | POA: Diagnosis not present

## 2018-11-29 DIAGNOSIS — R404 Transient alteration of awareness: Secondary | ICD-10-CM | POA: Diagnosis not present

## 2018-11-29 DIAGNOSIS — F22 Delusional disorders: Secondary | ICD-10-CM | POA: Diagnosis not present

## 2018-11-29 DIAGNOSIS — M85821 Other specified disorders of bone density and structure, right upper arm: Secondary | ICD-10-CM | POA: Diagnosis present

## 2018-11-29 DIAGNOSIS — D649 Anemia, unspecified: Secondary | ICD-10-CM | POA: Diagnosis not present

## 2018-11-29 DIAGNOSIS — R5383 Other fatigue: Secondary | ICD-10-CM | POA: Diagnosis not present

## 2018-11-29 DIAGNOSIS — M6281 Muscle weakness (generalized): Secondary | ICD-10-CM | POA: Diagnosis not present

## 2018-11-29 DIAGNOSIS — N319 Neuromuscular dysfunction of bladder, unspecified: Secondary | ICD-10-CM | POA: Diagnosis not present

## 2018-11-29 DIAGNOSIS — E875 Hyperkalemia: Secondary | ICD-10-CM | POA: Diagnosis present

## 2018-11-29 DIAGNOSIS — S43021A Posterior subluxation of right humerus, initial encounter: Secondary | ICD-10-CM | POA: Diagnosis not present

## 2018-11-29 DIAGNOSIS — I69351 Hemiplegia and hemiparesis following cerebral infarction affecting right dominant side: Secondary | ICD-10-CM | POA: Diagnosis not present

## 2018-11-29 DIAGNOSIS — N183 Chronic kidney disease, stage 3 (moderate): Secondary | ICD-10-CM | POA: Diagnosis present

## 2018-11-29 DIAGNOSIS — N3001 Acute cystitis with hematuria: Secondary | ICD-10-CM | POA: Diagnosis present

## 2018-11-29 DIAGNOSIS — I69331 Monoplegia of upper limb following cerebral infarction affecting right dominant side: Secondary | ICD-10-CM | POA: Diagnosis not present

## 2018-11-29 DIAGNOSIS — F29 Unspecified psychosis not due to a substance or known physiological condition: Secondary | ICD-10-CM | POA: Diagnosis not present

## 2018-11-29 DIAGNOSIS — B961 Klebsiella pneumoniae [K. pneumoniae] as the cause of diseases classified elsewhere: Secondary | ICD-10-CM | POA: Diagnosis present

## 2018-11-29 DIAGNOSIS — T83518A Infection and inflammatory reaction due to other urinary catheter, initial encounter: Secondary | ICD-10-CM | POA: Diagnosis not present

## 2018-11-29 DIAGNOSIS — R1312 Dysphagia, oropharyngeal phase: Secondary | ICD-10-CM | POA: Diagnosis not present

## 2018-11-29 DIAGNOSIS — R7989 Other specified abnormal findings of blood chemistry: Secondary | ICD-10-CM | POA: Diagnosis not present

## 2018-11-29 DIAGNOSIS — N390 Urinary tract infection, site not specified: Secondary | ICD-10-CM | POA: Diagnosis not present

## 2018-11-29 DIAGNOSIS — R5381 Other malaise: Secondary | ICD-10-CM | POA: Diagnosis present

## 2018-11-29 DIAGNOSIS — I10 Essential (primary) hypertension: Secondary | ICD-10-CM | POA: Diagnosis not present

## 2018-11-29 DIAGNOSIS — R531 Weakness: Secondary | ICD-10-CM | POA: Diagnosis not present

## 2018-11-29 DIAGNOSIS — R2689 Other abnormalities of gait and mobility: Secondary | ICD-10-CM | POA: Diagnosis not present

## 2018-11-29 DIAGNOSIS — Z7401 Bed confinement status: Secondary | ICD-10-CM | POA: Diagnosis not present

## 2018-11-29 DIAGNOSIS — I959 Hypotension, unspecified: Secondary | ICD-10-CM | POA: Diagnosis not present

## 2018-11-29 DIAGNOSIS — M25511 Pain in right shoulder: Secondary | ICD-10-CM | POA: Diagnosis not present

## 2018-11-29 DIAGNOSIS — N179 Acute kidney failure, unspecified: Secondary | ICD-10-CM | POA: Diagnosis not present

## 2018-11-29 DIAGNOSIS — D638 Anemia in other chronic diseases classified elsewhere: Secondary | ICD-10-CM | POA: Diagnosis present

## 2018-11-29 DIAGNOSIS — G9341 Metabolic encephalopathy: Secondary | ICD-10-CM | POA: Diagnosis present

## 2018-11-29 DIAGNOSIS — Z79899 Other long term (current) drug therapy: Secondary | ICD-10-CM | POA: Diagnosis not present

## 2018-11-29 DIAGNOSIS — R41841 Cognitive communication deficit: Secondary | ICD-10-CM | POA: Diagnosis not present

## 2018-11-29 DIAGNOSIS — Z9181 History of falling: Secondary | ICD-10-CM | POA: Diagnosis not present

## 2018-11-29 DIAGNOSIS — R4182 Altered mental status, unspecified: Secondary | ICD-10-CM | POA: Diagnosis not present

## 2018-11-29 DIAGNOSIS — I129 Hypertensive chronic kidney disease with stage 1 through stage 4 chronic kidney disease, or unspecified chronic kidney disease: Secondary | ICD-10-CM | POA: Diagnosis present

## 2018-11-29 DIAGNOSIS — E8889 Other specified metabolic disorders: Secondary | ICD-10-CM | POA: Diagnosis not present

## 2018-11-29 DIAGNOSIS — S42211D Unspecified displaced fracture of surgical neck of right humerus, subsequent encounter for fracture with routine healing: Secondary | ICD-10-CM | POA: Diagnosis not present

## 2018-12-03 DIAGNOSIS — N3001 Acute cystitis with hematuria: Secondary | ICD-10-CM | POA: Diagnosis not present

## 2018-12-03 DIAGNOSIS — F29 Unspecified psychosis not due to a substance or known physiological condition: Secondary | ICD-10-CM | POA: Diagnosis not present

## 2018-12-03 DIAGNOSIS — F22 Delusional disorders: Secondary | ICD-10-CM | POA: Diagnosis not present

## 2018-12-03 DIAGNOSIS — S42201A Unspecified fracture of upper end of right humerus, initial encounter for closed fracture: Secondary | ICD-10-CM | POA: Diagnosis not present

## 2018-12-03 DIAGNOSIS — R2689 Other abnormalities of gait and mobility: Secondary | ICD-10-CM | POA: Diagnosis not present

## 2018-12-03 DIAGNOSIS — D631 Anemia in chronic kidney disease: Secondary | ICD-10-CM | POA: Diagnosis not present

## 2018-12-03 DIAGNOSIS — R404 Transient alteration of awareness: Secondary | ICD-10-CM | POA: Diagnosis not present

## 2018-12-03 DIAGNOSIS — Z936 Other artificial openings of urinary tract status: Secondary | ICD-10-CM | POA: Diagnosis not present

## 2018-12-03 DIAGNOSIS — R509 Fever, unspecified: Secondary | ICD-10-CM | POA: Diagnosis not present

## 2018-12-03 DIAGNOSIS — G9341 Metabolic encephalopathy: Secondary | ICD-10-CM | POA: Diagnosis not present

## 2018-12-03 DIAGNOSIS — Z7401 Bed confinement status: Secondary | ICD-10-CM | POA: Diagnosis not present

## 2018-12-03 DIAGNOSIS — M6281 Muscle weakness (generalized): Secondary | ICD-10-CM | POA: Diagnosis not present

## 2018-12-03 DIAGNOSIS — R41841 Cognitive communication deficit: Secondary | ICD-10-CM | POA: Diagnosis not present

## 2018-12-03 DIAGNOSIS — R4182 Altered mental status, unspecified: Secondary | ICD-10-CM | POA: Diagnosis not present

## 2018-12-03 DIAGNOSIS — D638 Anemia in other chronic diseases classified elsewhere: Secondary | ICD-10-CM | POA: Diagnosis not present

## 2018-12-03 DIAGNOSIS — N319 Neuromuscular dysfunction of bladder, unspecified: Secondary | ICD-10-CM | POA: Diagnosis not present

## 2018-12-03 DIAGNOSIS — I10 Essential (primary) hypertension: Secondary | ICD-10-CM | POA: Diagnosis not present

## 2018-12-03 DIAGNOSIS — N179 Acute kidney failure, unspecified: Secondary | ICD-10-CM | POA: Diagnosis not present

## 2018-12-03 DIAGNOSIS — N390 Urinary tract infection, site not specified: Secondary | ICD-10-CM | POA: Diagnosis not present

## 2018-12-03 DIAGNOSIS — Z9181 History of falling: Secondary | ICD-10-CM | POA: Diagnosis not present

## 2018-12-03 DIAGNOSIS — B961 Klebsiella pneumoniae [K. pneumoniae] as the cause of diseases classified elsewhere: Secondary | ICD-10-CM | POA: Diagnosis not present

## 2018-12-03 DIAGNOSIS — N189 Chronic kidney disease, unspecified: Secondary | ICD-10-CM | POA: Diagnosis not present

## 2018-12-03 DIAGNOSIS — D72829 Elevated white blood cell count, unspecified: Secondary | ICD-10-CM | POA: Diagnosis not present

## 2018-12-03 DIAGNOSIS — R1312 Dysphagia, oropharyngeal phase: Secondary | ICD-10-CM | POA: Diagnosis not present

## 2018-12-03 DIAGNOSIS — D649 Anemia, unspecified: Secondary | ICD-10-CM | POA: Diagnosis not present

## 2018-12-03 DIAGNOSIS — R531 Weakness: Secondary | ICD-10-CM | POA: Diagnosis not present

## 2018-12-03 DIAGNOSIS — I69351 Hemiplegia and hemiparesis following cerebral infarction affecting right dominant side: Secondary | ICD-10-CM | POA: Diagnosis not present

## 2018-12-03 DIAGNOSIS — S7291XA Unspecified fracture of right femur, initial encounter for closed fracture: Secondary | ICD-10-CM | POA: Diagnosis not present

## 2018-12-03 DIAGNOSIS — S42211D Unspecified displaced fracture of surgical neck of right humerus, subsequent encounter for fracture with routine healing: Secondary | ICD-10-CM | POA: Diagnosis not present

## 2018-12-04 DIAGNOSIS — N179 Acute kidney failure, unspecified: Secondary | ICD-10-CM | POA: Diagnosis not present

## 2018-12-04 DIAGNOSIS — B961 Klebsiella pneumoniae [K. pneumoniae] as the cause of diseases classified elsewhere: Secondary | ICD-10-CM | POA: Diagnosis not present

## 2018-12-04 DIAGNOSIS — S42201A Unspecified fracture of upper end of right humerus, initial encounter for closed fracture: Secondary | ICD-10-CM | POA: Diagnosis not present

## 2018-12-04 DIAGNOSIS — I10 Essential (primary) hypertension: Secondary | ICD-10-CM | POA: Diagnosis not present

## 2018-12-22 DIAGNOSIS — N189 Chronic kidney disease, unspecified: Secondary | ICD-10-CM | POA: Diagnosis not present

## 2018-12-22 DIAGNOSIS — D638 Anemia in other chronic diseases classified elsewhere: Secondary | ICD-10-CM | POA: Diagnosis not present

## 2018-12-22 DIAGNOSIS — I10 Essential (primary) hypertension: Secondary | ICD-10-CM | POA: Diagnosis not present

## 2018-12-23 DIAGNOSIS — D649 Anemia, unspecified: Secondary | ICD-10-CM | POA: Diagnosis not present

## 2018-12-23 DIAGNOSIS — R531 Weakness: Secondary | ICD-10-CM | POA: Diagnosis not present

## 2018-12-25 DIAGNOSIS — R531 Weakness: Secondary | ICD-10-CM | POA: Diagnosis not present

## 2018-12-25 DIAGNOSIS — D649 Anemia, unspecified: Secondary | ICD-10-CM | POA: Diagnosis not present

## 2018-12-29 DIAGNOSIS — R509 Fever, unspecified: Secondary | ICD-10-CM | POA: Diagnosis not present

## 2018-12-29 DIAGNOSIS — D72829 Elevated white blood cell count, unspecified: Secondary | ICD-10-CM | POA: Diagnosis not present

## 2018-12-30 ENCOUNTER — Ambulatory Visit: Payer: Medicare Other | Admitting: Urology

## 2018-12-31 DIAGNOSIS — N179 Acute kidney failure, unspecified: Secondary | ICD-10-CM | POA: Diagnosis not present

## 2018-12-31 DIAGNOSIS — S7291XA Unspecified fracture of right femur, initial encounter for closed fracture: Secondary | ICD-10-CM | POA: Diagnosis not present

## 2018-12-31 DIAGNOSIS — I10 Essential (primary) hypertension: Secondary | ICD-10-CM | POA: Diagnosis not present

## 2018-12-31 DIAGNOSIS — N390 Urinary tract infection, site not specified: Secondary | ICD-10-CM | POA: Diagnosis not present

## 2019-01-01 DIAGNOSIS — N319 Neuromuscular dysfunction of bladder, unspecified: Secondary | ICD-10-CM | POA: Diagnosis not present

## 2019-01-01 DIAGNOSIS — Z5181 Encounter for therapeutic drug level monitoring: Secondary | ICD-10-CM | POA: Diagnosis not present

## 2019-01-01 DIAGNOSIS — D631 Anemia in chronic kidney disease: Secondary | ICD-10-CM | POA: Diagnosis not present

## 2019-01-01 DIAGNOSIS — I69351 Hemiplegia and hemiparesis following cerebral infarction affecting right dominant side: Secondary | ICD-10-CM | POA: Diagnosis not present

## 2019-01-01 DIAGNOSIS — S42211D Unspecified displaced fracture of surgical neck of right humerus, subsequent encounter for fracture with routine healing: Secondary | ICD-10-CM | POA: Diagnosis not present

## 2019-01-01 DIAGNOSIS — B961 Klebsiella pneumoniae [K. pneumoniae] as the cause of diseases classified elsewhere: Secondary | ICD-10-CM | POA: Diagnosis not present

## 2019-01-01 DIAGNOSIS — L89310 Pressure ulcer of right buttock, unstageable: Secondary | ICD-10-CM | POA: Diagnosis not present

## 2019-01-01 DIAGNOSIS — L89322 Pressure ulcer of left buttock, stage 2: Secondary | ICD-10-CM | POA: Diagnosis not present

## 2019-01-01 DIAGNOSIS — Z7902 Long term (current) use of antithrombotics/antiplatelets: Secondary | ICD-10-CM | POA: Diagnosis not present

## 2019-01-01 DIAGNOSIS — Z9181 History of falling: Secondary | ICD-10-CM | POA: Diagnosis not present

## 2019-01-01 DIAGNOSIS — N189 Chronic kidney disease, unspecified: Secondary | ICD-10-CM | POA: Diagnosis not present

## 2019-01-01 DIAGNOSIS — Z466 Encounter for fitting and adjustment of urinary device: Secondary | ICD-10-CM | POA: Diagnosis not present

## 2019-01-01 DIAGNOSIS — I129 Hypertensive chronic kidney disease with stage 1 through stage 4 chronic kidney disease, or unspecified chronic kidney disease: Secondary | ICD-10-CM | POA: Diagnosis not present

## 2019-01-01 DIAGNOSIS — N390 Urinary tract infection, site not specified: Secondary | ICD-10-CM | POA: Diagnosis not present

## 2019-01-01 DIAGNOSIS — Z86718 Personal history of other venous thrombosis and embolism: Secondary | ICD-10-CM | POA: Diagnosis not present

## 2019-01-01 DIAGNOSIS — Z79899 Other long term (current) drug therapy: Secondary | ICD-10-CM | POA: Diagnosis not present

## 2019-01-04 DIAGNOSIS — I129 Hypertensive chronic kidney disease with stage 1 through stage 4 chronic kidney disease, or unspecified chronic kidney disease: Secondary | ICD-10-CM | POA: Diagnosis not present

## 2019-01-04 DIAGNOSIS — L89322 Pressure ulcer of left buttock, stage 2: Secondary | ICD-10-CM | POA: Diagnosis not present

## 2019-01-04 DIAGNOSIS — N189 Chronic kidney disease, unspecified: Secondary | ICD-10-CM | POA: Diagnosis not present

## 2019-01-04 DIAGNOSIS — S42211D Unspecified displaced fracture of surgical neck of right humerus, subsequent encounter for fracture with routine healing: Secondary | ICD-10-CM | POA: Diagnosis not present

## 2019-01-04 DIAGNOSIS — D631 Anemia in chronic kidney disease: Secondary | ICD-10-CM | POA: Diagnosis not present

## 2019-01-04 DIAGNOSIS — L89310 Pressure ulcer of right buttock, unstageable: Secondary | ICD-10-CM | POA: Diagnosis not present

## 2019-01-05 DIAGNOSIS — L89322 Pressure ulcer of left buttock, stage 2: Secondary | ICD-10-CM | POA: Diagnosis not present

## 2019-01-05 DIAGNOSIS — D631 Anemia in chronic kidney disease: Secondary | ICD-10-CM | POA: Diagnosis not present

## 2019-01-05 DIAGNOSIS — L89310 Pressure ulcer of right buttock, unstageable: Secondary | ICD-10-CM | POA: Diagnosis not present

## 2019-01-05 DIAGNOSIS — N189 Chronic kidney disease, unspecified: Secondary | ICD-10-CM | POA: Diagnosis not present

## 2019-01-05 DIAGNOSIS — S42211D Unspecified displaced fracture of surgical neck of right humerus, subsequent encounter for fracture with routine healing: Secondary | ICD-10-CM | POA: Diagnosis not present

## 2019-01-05 DIAGNOSIS — I129 Hypertensive chronic kidney disease with stage 1 through stage 4 chronic kidney disease, or unspecified chronic kidney disease: Secondary | ICD-10-CM | POA: Diagnosis not present

## 2019-01-06 DIAGNOSIS — D631 Anemia in chronic kidney disease: Secondary | ICD-10-CM | POA: Diagnosis not present

## 2019-01-06 DIAGNOSIS — L89310 Pressure ulcer of right buttock, unstageable: Secondary | ICD-10-CM | POA: Diagnosis not present

## 2019-01-06 DIAGNOSIS — S42211D Unspecified displaced fracture of surgical neck of right humerus, subsequent encounter for fracture with routine healing: Secondary | ICD-10-CM | POA: Diagnosis not present

## 2019-01-06 DIAGNOSIS — L89322 Pressure ulcer of left buttock, stage 2: Secondary | ICD-10-CM | POA: Diagnosis not present

## 2019-01-06 DIAGNOSIS — N189 Chronic kidney disease, unspecified: Secondary | ICD-10-CM | POA: Diagnosis not present

## 2019-01-06 DIAGNOSIS — I129 Hypertensive chronic kidney disease with stage 1 through stage 4 chronic kidney disease, or unspecified chronic kidney disease: Secondary | ICD-10-CM | POA: Diagnosis not present

## 2019-01-07 DIAGNOSIS — N189 Chronic kidney disease, unspecified: Secondary | ICD-10-CM | POA: Diagnosis not present

## 2019-01-07 DIAGNOSIS — S42211D Unspecified displaced fracture of surgical neck of right humerus, subsequent encounter for fracture with routine healing: Secondary | ICD-10-CM | POA: Diagnosis not present

## 2019-01-07 DIAGNOSIS — L89322 Pressure ulcer of left buttock, stage 2: Secondary | ICD-10-CM | POA: Diagnosis not present

## 2019-01-07 DIAGNOSIS — I10 Essential (primary) hypertension: Secondary | ICD-10-CM | POA: Diagnosis not present

## 2019-01-07 DIAGNOSIS — D631 Anemia in chronic kidney disease: Secondary | ICD-10-CM | POA: Diagnosis not present

## 2019-01-07 DIAGNOSIS — N179 Acute kidney failure, unspecified: Secondary | ICD-10-CM | POA: Diagnosis not present

## 2019-01-07 DIAGNOSIS — G459 Transient cerebral ischemic attack, unspecified: Secondary | ICD-10-CM | POA: Diagnosis not present

## 2019-01-07 DIAGNOSIS — I129 Hypertensive chronic kidney disease with stage 1 through stage 4 chronic kidney disease, or unspecified chronic kidney disease: Secondary | ICD-10-CM | POA: Diagnosis not present

## 2019-01-07 DIAGNOSIS — L89310 Pressure ulcer of right buttock, unstageable: Secondary | ICD-10-CM | POA: Diagnosis not present

## 2019-01-08 DIAGNOSIS — I10 Essential (primary) hypertension: Secondary | ICD-10-CM | POA: Diagnosis not present

## 2019-01-08 DIAGNOSIS — S42301A Unspecified fracture of shaft of humerus, right arm, initial encounter for closed fracture: Secondary | ICD-10-CM | POA: Diagnosis not present

## 2019-01-08 DIAGNOSIS — Z299 Encounter for prophylactic measures, unspecified: Secondary | ICD-10-CM | POA: Diagnosis not present

## 2019-01-08 DIAGNOSIS — L89309 Pressure ulcer of unspecified buttock, unspecified stage: Secondary | ICD-10-CM | POA: Diagnosis not present

## 2019-01-08 DIAGNOSIS — Z6825 Body mass index (BMI) 25.0-25.9, adult: Secondary | ICD-10-CM | POA: Diagnosis not present

## 2019-01-08 DIAGNOSIS — G8191 Hemiplegia, unspecified affecting right dominant side: Secondary | ICD-10-CM | POA: Diagnosis not present

## 2019-01-08 DIAGNOSIS — I82409 Acute embolism and thrombosis of unspecified deep veins of unspecified lower extremity: Secondary | ICD-10-CM | POA: Diagnosis not present

## 2019-01-11 DIAGNOSIS — I129 Hypertensive chronic kidney disease with stage 1 through stage 4 chronic kidney disease, or unspecified chronic kidney disease: Secondary | ICD-10-CM | POA: Diagnosis not present

## 2019-01-11 DIAGNOSIS — D631 Anemia in chronic kidney disease: Secondary | ICD-10-CM | POA: Diagnosis not present

## 2019-01-11 DIAGNOSIS — S42211D Unspecified displaced fracture of surgical neck of right humerus, subsequent encounter for fracture with routine healing: Secondary | ICD-10-CM | POA: Diagnosis not present

## 2019-01-11 DIAGNOSIS — L89322 Pressure ulcer of left buttock, stage 2: Secondary | ICD-10-CM | POA: Diagnosis not present

## 2019-01-11 DIAGNOSIS — N189 Chronic kidney disease, unspecified: Secondary | ICD-10-CM | POA: Diagnosis not present

## 2019-01-11 DIAGNOSIS — L89310 Pressure ulcer of right buttock, unstageable: Secondary | ICD-10-CM | POA: Diagnosis not present

## 2019-01-13 DIAGNOSIS — L89322 Pressure ulcer of left buttock, stage 2: Secondary | ICD-10-CM | POA: Diagnosis not present

## 2019-01-13 DIAGNOSIS — I129 Hypertensive chronic kidney disease with stage 1 through stage 4 chronic kidney disease, or unspecified chronic kidney disease: Secondary | ICD-10-CM | POA: Diagnosis not present

## 2019-01-13 DIAGNOSIS — D631 Anemia in chronic kidney disease: Secondary | ICD-10-CM | POA: Diagnosis not present

## 2019-01-13 DIAGNOSIS — L89310 Pressure ulcer of right buttock, unstageable: Secondary | ICD-10-CM | POA: Diagnosis not present

## 2019-01-13 DIAGNOSIS — N189 Chronic kidney disease, unspecified: Secondary | ICD-10-CM | POA: Diagnosis not present

## 2019-01-13 DIAGNOSIS — S42211D Unspecified displaced fracture of surgical neck of right humerus, subsequent encounter for fracture with routine healing: Secondary | ICD-10-CM | POA: Diagnosis not present

## 2019-01-15 DIAGNOSIS — L89310 Pressure ulcer of right buttock, unstageable: Secondary | ICD-10-CM | POA: Diagnosis not present

## 2019-01-15 DIAGNOSIS — L89322 Pressure ulcer of left buttock, stage 2: Secondary | ICD-10-CM | POA: Diagnosis not present

## 2019-01-15 DIAGNOSIS — D631 Anemia in chronic kidney disease: Secondary | ICD-10-CM | POA: Diagnosis not present

## 2019-01-15 DIAGNOSIS — S42211D Unspecified displaced fracture of surgical neck of right humerus, subsequent encounter for fracture with routine healing: Secondary | ICD-10-CM | POA: Diagnosis not present

## 2019-01-15 DIAGNOSIS — I129 Hypertensive chronic kidney disease with stage 1 through stage 4 chronic kidney disease, or unspecified chronic kidney disease: Secondary | ICD-10-CM | POA: Diagnosis not present

## 2019-01-15 DIAGNOSIS — N189 Chronic kidney disease, unspecified: Secondary | ICD-10-CM | POA: Diagnosis not present

## 2019-01-18 DIAGNOSIS — I129 Hypertensive chronic kidney disease with stage 1 through stage 4 chronic kidney disease, or unspecified chronic kidney disease: Secondary | ICD-10-CM | POA: Diagnosis not present

## 2019-01-18 DIAGNOSIS — L89322 Pressure ulcer of left buttock, stage 2: Secondary | ICD-10-CM | POA: Diagnosis not present

## 2019-01-18 DIAGNOSIS — N189 Chronic kidney disease, unspecified: Secondary | ICD-10-CM | POA: Diagnosis not present

## 2019-01-18 DIAGNOSIS — S42211D Unspecified displaced fracture of surgical neck of right humerus, subsequent encounter for fracture with routine healing: Secondary | ICD-10-CM | POA: Diagnosis not present

## 2019-01-18 DIAGNOSIS — L89310 Pressure ulcer of right buttock, unstageable: Secondary | ICD-10-CM | POA: Diagnosis not present

## 2019-01-18 DIAGNOSIS — D631 Anemia in chronic kidney disease: Secondary | ICD-10-CM | POA: Diagnosis not present

## 2019-01-19 DIAGNOSIS — L89310 Pressure ulcer of right buttock, unstageable: Secondary | ICD-10-CM | POA: Diagnosis not present

## 2019-01-19 DIAGNOSIS — I129 Hypertensive chronic kidney disease with stage 1 through stage 4 chronic kidney disease, or unspecified chronic kidney disease: Secondary | ICD-10-CM | POA: Diagnosis not present

## 2019-01-19 DIAGNOSIS — N189 Chronic kidney disease, unspecified: Secondary | ICD-10-CM | POA: Diagnosis not present

## 2019-01-19 DIAGNOSIS — S42211D Unspecified displaced fracture of surgical neck of right humerus, subsequent encounter for fracture with routine healing: Secondary | ICD-10-CM | POA: Diagnosis not present

## 2019-01-19 DIAGNOSIS — L89322 Pressure ulcer of left buttock, stage 2: Secondary | ICD-10-CM | POA: Diagnosis not present

## 2019-01-19 DIAGNOSIS — D631 Anemia in chronic kidney disease: Secondary | ICD-10-CM | POA: Diagnosis not present

## 2019-01-21 DIAGNOSIS — L89310 Pressure ulcer of right buttock, unstageable: Secondary | ICD-10-CM | POA: Diagnosis not present

## 2019-01-21 DIAGNOSIS — D631 Anemia in chronic kidney disease: Secondary | ICD-10-CM | POA: Diagnosis not present

## 2019-01-21 DIAGNOSIS — S42211D Unspecified displaced fracture of surgical neck of right humerus, subsequent encounter for fracture with routine healing: Secondary | ICD-10-CM | POA: Diagnosis not present

## 2019-01-21 DIAGNOSIS — L89322 Pressure ulcer of left buttock, stage 2: Secondary | ICD-10-CM | POA: Diagnosis not present

## 2019-01-21 DIAGNOSIS — N189 Chronic kidney disease, unspecified: Secondary | ICD-10-CM | POA: Diagnosis not present

## 2019-01-21 DIAGNOSIS — I129 Hypertensive chronic kidney disease with stage 1 through stage 4 chronic kidney disease, or unspecified chronic kidney disease: Secondary | ICD-10-CM | POA: Diagnosis not present

## 2019-01-22 DIAGNOSIS — S42211D Unspecified displaced fracture of surgical neck of right humerus, subsequent encounter for fracture with routine healing: Secondary | ICD-10-CM | POA: Diagnosis not present

## 2019-01-25 DIAGNOSIS — N189 Chronic kidney disease, unspecified: Secondary | ICD-10-CM | POA: Diagnosis not present

## 2019-01-25 DIAGNOSIS — S42211D Unspecified displaced fracture of surgical neck of right humerus, subsequent encounter for fracture with routine healing: Secondary | ICD-10-CM | POA: Diagnosis not present

## 2019-01-25 DIAGNOSIS — D631 Anemia in chronic kidney disease: Secondary | ICD-10-CM | POA: Diagnosis not present

## 2019-01-25 DIAGNOSIS — L89322 Pressure ulcer of left buttock, stage 2: Secondary | ICD-10-CM | POA: Diagnosis not present

## 2019-01-25 DIAGNOSIS — L89310 Pressure ulcer of right buttock, unstageable: Secondary | ICD-10-CM | POA: Diagnosis not present

## 2019-01-25 DIAGNOSIS — I129 Hypertensive chronic kidney disease with stage 1 through stage 4 chronic kidney disease, or unspecified chronic kidney disease: Secondary | ICD-10-CM | POA: Diagnosis not present

## 2019-01-27 DIAGNOSIS — S42211D Unspecified displaced fracture of surgical neck of right humerus, subsequent encounter for fracture with routine healing: Secondary | ICD-10-CM | POA: Diagnosis not present

## 2019-01-27 DIAGNOSIS — N189 Chronic kidney disease, unspecified: Secondary | ICD-10-CM | POA: Diagnosis not present

## 2019-01-27 DIAGNOSIS — L89322 Pressure ulcer of left buttock, stage 2: Secondary | ICD-10-CM | POA: Diagnosis not present

## 2019-01-27 DIAGNOSIS — I129 Hypertensive chronic kidney disease with stage 1 through stage 4 chronic kidney disease, or unspecified chronic kidney disease: Secondary | ICD-10-CM | POA: Diagnosis not present

## 2019-01-27 DIAGNOSIS — D631 Anemia in chronic kidney disease: Secondary | ICD-10-CM | POA: Diagnosis not present

## 2019-01-27 DIAGNOSIS — L89310 Pressure ulcer of right buttock, unstageable: Secondary | ICD-10-CM | POA: Diagnosis not present

## 2019-01-31 DIAGNOSIS — N390 Urinary tract infection, site not specified: Secondary | ICD-10-CM | POA: Diagnosis not present

## 2019-01-31 DIAGNOSIS — N189 Chronic kidney disease, unspecified: Secondary | ICD-10-CM | POA: Diagnosis not present

## 2019-01-31 DIAGNOSIS — Z86718 Personal history of other venous thrombosis and embolism: Secondary | ICD-10-CM | POA: Diagnosis not present

## 2019-01-31 DIAGNOSIS — Z7902 Long term (current) use of antithrombotics/antiplatelets: Secondary | ICD-10-CM | POA: Diagnosis not present

## 2019-01-31 DIAGNOSIS — L89310 Pressure ulcer of right buttock, unstageable: Secondary | ICD-10-CM | POA: Diagnosis not present

## 2019-01-31 DIAGNOSIS — D631 Anemia in chronic kidney disease: Secondary | ICD-10-CM | POA: Diagnosis not present

## 2019-01-31 DIAGNOSIS — N319 Neuromuscular dysfunction of bladder, unspecified: Secondary | ICD-10-CM | POA: Diagnosis not present

## 2019-01-31 DIAGNOSIS — L89322 Pressure ulcer of left buttock, stage 2: Secondary | ICD-10-CM | POA: Diagnosis not present

## 2019-01-31 DIAGNOSIS — I69351 Hemiplegia and hemiparesis following cerebral infarction affecting right dominant side: Secondary | ICD-10-CM | POA: Diagnosis not present

## 2019-01-31 DIAGNOSIS — Z466 Encounter for fitting and adjustment of urinary device: Secondary | ICD-10-CM | POA: Diagnosis not present

## 2019-01-31 DIAGNOSIS — Z79899 Other long term (current) drug therapy: Secondary | ICD-10-CM | POA: Diagnosis not present

## 2019-01-31 DIAGNOSIS — Z9181 History of falling: Secondary | ICD-10-CM | POA: Diagnosis not present

## 2019-01-31 DIAGNOSIS — Z5181 Encounter for therapeutic drug level monitoring: Secondary | ICD-10-CM | POA: Diagnosis not present

## 2019-01-31 DIAGNOSIS — B961 Klebsiella pneumoniae [K. pneumoniae] as the cause of diseases classified elsewhere: Secondary | ICD-10-CM | POA: Diagnosis not present

## 2019-01-31 DIAGNOSIS — S42211D Unspecified displaced fracture of surgical neck of right humerus, subsequent encounter for fracture with routine healing: Secondary | ICD-10-CM | POA: Diagnosis not present

## 2019-01-31 DIAGNOSIS — I129 Hypertensive chronic kidney disease with stage 1 through stage 4 chronic kidney disease, or unspecified chronic kidney disease: Secondary | ICD-10-CM | POA: Diagnosis not present

## 2019-02-01 DIAGNOSIS — I129 Hypertensive chronic kidney disease with stage 1 through stage 4 chronic kidney disease, or unspecified chronic kidney disease: Secondary | ICD-10-CM | POA: Diagnosis not present

## 2019-02-01 DIAGNOSIS — L89322 Pressure ulcer of left buttock, stage 2: Secondary | ICD-10-CM | POA: Diagnosis not present

## 2019-02-01 DIAGNOSIS — L89310 Pressure ulcer of right buttock, unstageable: Secondary | ICD-10-CM | POA: Diagnosis not present

## 2019-02-01 DIAGNOSIS — S42211D Unspecified displaced fracture of surgical neck of right humerus, subsequent encounter for fracture with routine healing: Secondary | ICD-10-CM | POA: Diagnosis not present

## 2019-02-01 DIAGNOSIS — D631 Anemia in chronic kidney disease: Secondary | ICD-10-CM | POA: Diagnosis not present

## 2019-02-01 DIAGNOSIS — N189 Chronic kidney disease, unspecified: Secondary | ICD-10-CM | POA: Diagnosis not present

## 2019-02-04 DIAGNOSIS — S42211D Unspecified displaced fracture of surgical neck of right humerus, subsequent encounter for fracture with routine healing: Secondary | ICD-10-CM | POA: Diagnosis not present

## 2019-02-04 DIAGNOSIS — L89322 Pressure ulcer of left buttock, stage 2: Secondary | ICD-10-CM | POA: Diagnosis not present

## 2019-02-04 DIAGNOSIS — D631 Anemia in chronic kidney disease: Secondary | ICD-10-CM | POA: Diagnosis not present

## 2019-02-04 DIAGNOSIS — I129 Hypertensive chronic kidney disease with stage 1 through stage 4 chronic kidney disease, or unspecified chronic kidney disease: Secondary | ICD-10-CM | POA: Diagnosis not present

## 2019-02-04 DIAGNOSIS — N189 Chronic kidney disease, unspecified: Secondary | ICD-10-CM | POA: Diagnosis not present

## 2019-02-04 DIAGNOSIS — L89310 Pressure ulcer of right buttock, unstageable: Secondary | ICD-10-CM | POA: Diagnosis not present

## 2019-02-08 DIAGNOSIS — L89322 Pressure ulcer of left buttock, stage 2: Secondary | ICD-10-CM | POA: Diagnosis not present

## 2019-02-08 DIAGNOSIS — N189 Chronic kidney disease, unspecified: Secondary | ICD-10-CM | POA: Diagnosis not present

## 2019-02-08 DIAGNOSIS — I129 Hypertensive chronic kidney disease with stage 1 through stage 4 chronic kidney disease, or unspecified chronic kidney disease: Secondary | ICD-10-CM | POA: Diagnosis not present

## 2019-02-08 DIAGNOSIS — S42211D Unspecified displaced fracture of surgical neck of right humerus, subsequent encounter for fracture with routine healing: Secondary | ICD-10-CM | POA: Diagnosis not present

## 2019-02-08 DIAGNOSIS — D631 Anemia in chronic kidney disease: Secondary | ICD-10-CM | POA: Diagnosis not present

## 2019-02-08 DIAGNOSIS — L89310 Pressure ulcer of right buttock, unstageable: Secondary | ICD-10-CM | POA: Diagnosis not present

## 2019-02-10 DIAGNOSIS — I129 Hypertensive chronic kidney disease with stage 1 through stage 4 chronic kidney disease, or unspecified chronic kidney disease: Secondary | ICD-10-CM | POA: Diagnosis not present

## 2019-02-10 DIAGNOSIS — S42211D Unspecified displaced fracture of surgical neck of right humerus, subsequent encounter for fracture with routine healing: Secondary | ICD-10-CM | POA: Diagnosis not present

## 2019-02-10 DIAGNOSIS — I10 Essential (primary) hypertension: Secondary | ICD-10-CM | POA: Diagnosis not present

## 2019-02-10 DIAGNOSIS — E7800 Pure hypercholesterolemia, unspecified: Secondary | ICD-10-CM | POA: Diagnosis not present

## 2019-02-10 DIAGNOSIS — L89310 Pressure ulcer of right buttock, unstageable: Secondary | ICD-10-CM | POA: Diagnosis not present

## 2019-02-10 DIAGNOSIS — D631 Anemia in chronic kidney disease: Secondary | ICD-10-CM | POA: Diagnosis not present

## 2019-02-10 DIAGNOSIS — I639 Cerebral infarction, unspecified: Secondary | ICD-10-CM | POA: Diagnosis not present

## 2019-02-10 DIAGNOSIS — N189 Chronic kidney disease, unspecified: Secondary | ICD-10-CM | POA: Diagnosis not present

## 2019-02-10 DIAGNOSIS — L89322 Pressure ulcer of left buttock, stage 2: Secondary | ICD-10-CM | POA: Diagnosis not present

## 2019-02-11 DIAGNOSIS — S42211D Unspecified displaced fracture of surgical neck of right humerus, subsequent encounter for fracture with routine healing: Secondary | ICD-10-CM | POA: Diagnosis not present

## 2019-02-11 DIAGNOSIS — I129 Hypertensive chronic kidney disease with stage 1 through stage 4 chronic kidney disease, or unspecified chronic kidney disease: Secondary | ICD-10-CM | POA: Diagnosis not present

## 2019-02-11 DIAGNOSIS — L89322 Pressure ulcer of left buttock, stage 2: Secondary | ICD-10-CM | POA: Diagnosis not present

## 2019-02-11 DIAGNOSIS — D631 Anemia in chronic kidney disease: Secondary | ICD-10-CM | POA: Diagnosis not present

## 2019-02-11 DIAGNOSIS — L89310 Pressure ulcer of right buttock, unstageable: Secondary | ICD-10-CM | POA: Diagnosis not present

## 2019-02-11 DIAGNOSIS — N189 Chronic kidney disease, unspecified: Secondary | ICD-10-CM | POA: Diagnosis not present

## 2019-02-15 DIAGNOSIS — I129 Hypertensive chronic kidney disease with stage 1 through stage 4 chronic kidney disease, or unspecified chronic kidney disease: Secondary | ICD-10-CM | POA: Diagnosis not present

## 2019-02-15 DIAGNOSIS — D631 Anemia in chronic kidney disease: Secondary | ICD-10-CM | POA: Diagnosis not present

## 2019-02-15 DIAGNOSIS — N189 Chronic kidney disease, unspecified: Secondary | ICD-10-CM | POA: Diagnosis not present

## 2019-02-15 DIAGNOSIS — L89310 Pressure ulcer of right buttock, unstageable: Secondary | ICD-10-CM | POA: Diagnosis not present

## 2019-02-15 DIAGNOSIS — S42211D Unspecified displaced fracture of surgical neck of right humerus, subsequent encounter for fracture with routine healing: Secondary | ICD-10-CM | POA: Diagnosis not present

## 2019-02-15 DIAGNOSIS — L89322 Pressure ulcer of left buttock, stage 2: Secondary | ICD-10-CM | POA: Diagnosis not present

## 2019-02-19 DIAGNOSIS — N189 Chronic kidney disease, unspecified: Secondary | ICD-10-CM | POA: Diagnosis not present

## 2019-02-19 DIAGNOSIS — L89310 Pressure ulcer of right buttock, unstageable: Secondary | ICD-10-CM | POA: Diagnosis not present

## 2019-02-19 DIAGNOSIS — D631 Anemia in chronic kidney disease: Secondary | ICD-10-CM | POA: Diagnosis not present

## 2019-02-19 DIAGNOSIS — L89322 Pressure ulcer of left buttock, stage 2: Secondary | ICD-10-CM | POA: Diagnosis not present

## 2019-02-19 DIAGNOSIS — I129 Hypertensive chronic kidney disease with stage 1 through stage 4 chronic kidney disease, or unspecified chronic kidney disease: Secondary | ICD-10-CM | POA: Diagnosis not present

## 2019-02-19 DIAGNOSIS — S42211D Unspecified displaced fracture of surgical neck of right humerus, subsequent encounter for fracture with routine healing: Secondary | ICD-10-CM | POA: Diagnosis not present

## 2019-02-22 DIAGNOSIS — L89322 Pressure ulcer of left buttock, stage 2: Secondary | ICD-10-CM | POA: Diagnosis not present

## 2019-02-22 DIAGNOSIS — L89310 Pressure ulcer of right buttock, unstageable: Secondary | ICD-10-CM | POA: Diagnosis not present

## 2019-02-22 DIAGNOSIS — N189 Chronic kidney disease, unspecified: Secondary | ICD-10-CM | POA: Diagnosis not present

## 2019-02-22 DIAGNOSIS — I129 Hypertensive chronic kidney disease with stage 1 through stage 4 chronic kidney disease, or unspecified chronic kidney disease: Secondary | ICD-10-CM | POA: Diagnosis not present

## 2019-02-22 DIAGNOSIS — S42211D Unspecified displaced fracture of surgical neck of right humerus, subsequent encounter for fracture with routine healing: Secondary | ICD-10-CM | POA: Diagnosis not present

## 2019-02-22 DIAGNOSIS — D631 Anemia in chronic kidney disease: Secondary | ICD-10-CM | POA: Diagnosis not present

## 2019-02-25 DIAGNOSIS — N189 Chronic kidney disease, unspecified: Secondary | ICD-10-CM | POA: Diagnosis not present

## 2019-02-25 DIAGNOSIS — I129 Hypertensive chronic kidney disease with stage 1 through stage 4 chronic kidney disease, or unspecified chronic kidney disease: Secondary | ICD-10-CM | POA: Diagnosis not present

## 2019-02-25 DIAGNOSIS — D631 Anemia in chronic kidney disease: Secondary | ICD-10-CM | POA: Diagnosis not present

## 2019-02-25 DIAGNOSIS — S42211D Unspecified displaced fracture of surgical neck of right humerus, subsequent encounter for fracture with routine healing: Secondary | ICD-10-CM | POA: Diagnosis not present

## 2019-02-25 DIAGNOSIS — L89310 Pressure ulcer of right buttock, unstageable: Secondary | ICD-10-CM | POA: Diagnosis not present

## 2019-02-25 DIAGNOSIS — L89322 Pressure ulcer of left buttock, stage 2: Secondary | ICD-10-CM | POA: Diagnosis not present

## 2019-03-02 DIAGNOSIS — I129 Hypertensive chronic kidney disease with stage 1 through stage 4 chronic kidney disease, or unspecified chronic kidney disease: Secondary | ICD-10-CM | POA: Diagnosis not present

## 2019-03-02 DIAGNOSIS — Z4800 Encounter for change or removal of nonsurgical wound dressing: Secondary | ICD-10-CM | POA: Diagnosis not present

## 2019-03-02 DIAGNOSIS — N319 Neuromuscular dysfunction of bladder, unspecified: Secondary | ICD-10-CM | POA: Diagnosis not present

## 2019-03-02 DIAGNOSIS — Z9181 History of falling: Secondary | ICD-10-CM | POA: Diagnosis not present

## 2019-03-02 DIAGNOSIS — N189 Chronic kidney disease, unspecified: Secondary | ICD-10-CM | POA: Diagnosis not present

## 2019-03-02 DIAGNOSIS — Z79899 Other long term (current) drug therapy: Secondary | ICD-10-CM | POA: Diagnosis not present

## 2019-03-02 DIAGNOSIS — Z7902 Long term (current) use of antithrombotics/antiplatelets: Secondary | ICD-10-CM | POA: Diagnosis not present

## 2019-03-02 DIAGNOSIS — L89310 Pressure ulcer of right buttock, unstageable: Secondary | ICD-10-CM | POA: Diagnosis not present

## 2019-03-02 DIAGNOSIS — D631 Anemia in chronic kidney disease: Secondary | ICD-10-CM | POA: Diagnosis not present

## 2019-03-02 DIAGNOSIS — I69351 Hemiplegia and hemiparesis following cerebral infarction affecting right dominant side: Secondary | ICD-10-CM | POA: Diagnosis not present

## 2019-03-02 DIAGNOSIS — Z435 Encounter for attention to cystostomy: Secondary | ICD-10-CM | POA: Diagnosis not present

## 2019-03-02 DIAGNOSIS — S42211D Unspecified displaced fracture of surgical neck of right humerus, subsequent encounter for fracture with routine healing: Secondary | ICD-10-CM | POA: Diagnosis not present

## 2019-03-02 DIAGNOSIS — Z86718 Personal history of other venous thrombosis and embolism: Secondary | ICD-10-CM | POA: Diagnosis not present

## 2019-03-02 DIAGNOSIS — Z87440 Personal history of urinary (tract) infections: Secondary | ICD-10-CM | POA: Diagnosis not present

## 2019-03-04 DIAGNOSIS — I69351 Hemiplegia and hemiparesis following cerebral infarction affecting right dominant side: Secondary | ICD-10-CM | POA: Diagnosis not present

## 2019-03-04 DIAGNOSIS — N189 Chronic kidney disease, unspecified: Secondary | ICD-10-CM | POA: Diagnosis not present

## 2019-03-04 DIAGNOSIS — I129 Hypertensive chronic kidney disease with stage 1 through stage 4 chronic kidney disease, or unspecified chronic kidney disease: Secondary | ICD-10-CM | POA: Diagnosis not present

## 2019-03-04 DIAGNOSIS — N319 Neuromuscular dysfunction of bladder, unspecified: Secondary | ICD-10-CM | POA: Diagnosis not present

## 2019-03-04 DIAGNOSIS — L89310 Pressure ulcer of right buttock, unstageable: Secondary | ICD-10-CM | POA: Diagnosis not present

## 2019-03-04 DIAGNOSIS — D631 Anemia in chronic kidney disease: Secondary | ICD-10-CM | POA: Diagnosis not present

## 2019-03-08 DIAGNOSIS — N319 Neuromuscular dysfunction of bladder, unspecified: Secondary | ICD-10-CM | POA: Diagnosis not present

## 2019-03-08 DIAGNOSIS — L89310 Pressure ulcer of right buttock, unstageable: Secondary | ICD-10-CM | POA: Diagnosis not present

## 2019-03-08 DIAGNOSIS — I129 Hypertensive chronic kidney disease with stage 1 through stage 4 chronic kidney disease, or unspecified chronic kidney disease: Secondary | ICD-10-CM | POA: Diagnosis not present

## 2019-03-08 DIAGNOSIS — N189 Chronic kidney disease, unspecified: Secondary | ICD-10-CM | POA: Diagnosis not present

## 2019-03-08 DIAGNOSIS — D631 Anemia in chronic kidney disease: Secondary | ICD-10-CM | POA: Diagnosis not present

## 2019-03-08 DIAGNOSIS — I69351 Hemiplegia and hemiparesis following cerebral infarction affecting right dominant side: Secondary | ICD-10-CM | POA: Diagnosis not present

## 2019-03-15 DIAGNOSIS — L89310 Pressure ulcer of right buttock, unstageable: Secondary | ICD-10-CM | POA: Diagnosis not present

## 2019-03-15 DIAGNOSIS — I69351 Hemiplegia and hemiparesis following cerebral infarction affecting right dominant side: Secondary | ICD-10-CM | POA: Diagnosis not present

## 2019-03-15 DIAGNOSIS — I129 Hypertensive chronic kidney disease with stage 1 through stage 4 chronic kidney disease, or unspecified chronic kidney disease: Secondary | ICD-10-CM | POA: Diagnosis not present

## 2019-03-15 DIAGNOSIS — N189 Chronic kidney disease, unspecified: Secondary | ICD-10-CM | POA: Diagnosis not present

## 2019-03-15 DIAGNOSIS — D631 Anemia in chronic kidney disease: Secondary | ICD-10-CM | POA: Diagnosis not present

## 2019-03-15 DIAGNOSIS — N319 Neuromuscular dysfunction of bladder, unspecified: Secondary | ICD-10-CM | POA: Diagnosis not present

## 2019-03-23 DIAGNOSIS — I129 Hypertensive chronic kidney disease with stage 1 through stage 4 chronic kidney disease, or unspecified chronic kidney disease: Secondary | ICD-10-CM | POA: Diagnosis not present

## 2019-03-23 DIAGNOSIS — N189 Chronic kidney disease, unspecified: Secondary | ICD-10-CM | POA: Diagnosis not present

## 2019-03-23 DIAGNOSIS — D631 Anemia in chronic kidney disease: Secondary | ICD-10-CM | POA: Diagnosis not present

## 2019-03-23 DIAGNOSIS — N319 Neuromuscular dysfunction of bladder, unspecified: Secondary | ICD-10-CM | POA: Diagnosis not present

## 2019-03-23 DIAGNOSIS — L89310 Pressure ulcer of right buttock, unstageable: Secondary | ICD-10-CM | POA: Diagnosis not present

## 2019-03-23 DIAGNOSIS — I69351 Hemiplegia and hemiparesis following cerebral infarction affecting right dominant side: Secondary | ICD-10-CM | POA: Diagnosis not present

## 2019-03-29 DIAGNOSIS — D631 Anemia in chronic kidney disease: Secondary | ICD-10-CM | POA: Diagnosis not present

## 2019-03-29 DIAGNOSIS — N189 Chronic kidney disease, unspecified: Secondary | ICD-10-CM | POA: Diagnosis not present

## 2019-03-29 DIAGNOSIS — I69351 Hemiplegia and hemiparesis following cerebral infarction affecting right dominant side: Secondary | ICD-10-CM | POA: Diagnosis not present

## 2019-03-29 DIAGNOSIS — I129 Hypertensive chronic kidney disease with stage 1 through stage 4 chronic kidney disease, or unspecified chronic kidney disease: Secondary | ICD-10-CM | POA: Diagnosis not present

## 2019-03-29 DIAGNOSIS — L89310 Pressure ulcer of right buttock, unstageable: Secondary | ICD-10-CM | POA: Diagnosis not present

## 2019-03-29 DIAGNOSIS — N319 Neuromuscular dysfunction of bladder, unspecified: Secondary | ICD-10-CM | POA: Diagnosis not present

## 2019-04-01 DIAGNOSIS — Z7902 Long term (current) use of antithrombotics/antiplatelets: Secondary | ICD-10-CM | POA: Diagnosis not present

## 2019-04-01 DIAGNOSIS — S42211D Unspecified displaced fracture of surgical neck of right humerus, subsequent encounter for fracture with routine healing: Secondary | ICD-10-CM | POA: Diagnosis not present

## 2019-04-01 DIAGNOSIS — I69351 Hemiplegia and hemiparesis following cerebral infarction affecting right dominant side: Secondary | ICD-10-CM | POA: Diagnosis not present

## 2019-04-01 DIAGNOSIS — Z9181 History of falling: Secondary | ICD-10-CM | POA: Diagnosis not present

## 2019-04-01 DIAGNOSIS — Z4800 Encounter for change or removal of nonsurgical wound dressing: Secondary | ICD-10-CM | POA: Diagnosis not present

## 2019-04-01 DIAGNOSIS — Z435 Encounter for attention to cystostomy: Secondary | ICD-10-CM | POA: Diagnosis not present

## 2019-04-01 DIAGNOSIS — L89310 Pressure ulcer of right buttock, unstageable: Secondary | ICD-10-CM | POA: Diagnosis not present

## 2019-04-01 DIAGNOSIS — N189 Chronic kidney disease, unspecified: Secondary | ICD-10-CM | POA: Diagnosis not present

## 2019-04-01 DIAGNOSIS — Z79899 Other long term (current) drug therapy: Secondary | ICD-10-CM | POA: Diagnosis not present

## 2019-04-01 DIAGNOSIS — Z87440 Personal history of urinary (tract) infections: Secondary | ICD-10-CM | POA: Diagnosis not present

## 2019-04-01 DIAGNOSIS — D631 Anemia in chronic kidney disease: Secondary | ICD-10-CM | POA: Diagnosis not present

## 2019-04-01 DIAGNOSIS — I129 Hypertensive chronic kidney disease with stage 1 through stage 4 chronic kidney disease, or unspecified chronic kidney disease: Secondary | ICD-10-CM | POA: Diagnosis not present

## 2019-04-01 DIAGNOSIS — N319 Neuromuscular dysfunction of bladder, unspecified: Secondary | ICD-10-CM | POA: Diagnosis not present

## 2019-04-01 DIAGNOSIS — Z86718 Personal history of other venous thrombosis and embolism: Secondary | ICD-10-CM | POA: Diagnosis not present

## 2019-04-06 DIAGNOSIS — D631 Anemia in chronic kidney disease: Secondary | ICD-10-CM | POA: Diagnosis not present

## 2019-04-06 DIAGNOSIS — L89310 Pressure ulcer of right buttock, unstageable: Secondary | ICD-10-CM | POA: Diagnosis not present

## 2019-04-06 DIAGNOSIS — N189 Chronic kidney disease, unspecified: Secondary | ICD-10-CM | POA: Diagnosis not present

## 2019-04-06 DIAGNOSIS — I129 Hypertensive chronic kidney disease with stage 1 through stage 4 chronic kidney disease, or unspecified chronic kidney disease: Secondary | ICD-10-CM | POA: Diagnosis not present

## 2019-04-06 DIAGNOSIS — N319 Neuromuscular dysfunction of bladder, unspecified: Secondary | ICD-10-CM | POA: Diagnosis not present

## 2019-04-06 DIAGNOSIS — I69351 Hemiplegia and hemiparesis following cerebral infarction affecting right dominant side: Secondary | ICD-10-CM | POA: Diagnosis not present

## 2019-04-08 DIAGNOSIS — D631 Anemia in chronic kidney disease: Secondary | ICD-10-CM | POA: Diagnosis not present

## 2019-04-08 DIAGNOSIS — I69351 Hemiplegia and hemiparesis following cerebral infarction affecting right dominant side: Secondary | ICD-10-CM | POA: Diagnosis not present

## 2019-04-08 DIAGNOSIS — N319 Neuromuscular dysfunction of bladder, unspecified: Secondary | ICD-10-CM | POA: Diagnosis not present

## 2019-04-08 DIAGNOSIS — I129 Hypertensive chronic kidney disease with stage 1 through stage 4 chronic kidney disease, or unspecified chronic kidney disease: Secondary | ICD-10-CM | POA: Diagnosis not present

## 2019-04-08 DIAGNOSIS — N189 Chronic kidney disease, unspecified: Secondary | ICD-10-CM | POA: Diagnosis not present

## 2019-04-08 DIAGNOSIS — L89310 Pressure ulcer of right buttock, unstageable: Secondary | ICD-10-CM | POA: Diagnosis not present

## 2019-04-11 DIAGNOSIS — L89310 Pressure ulcer of right buttock, unstageable: Secondary | ICD-10-CM | POA: Diagnosis not present

## 2019-04-14 DIAGNOSIS — N319 Neuromuscular dysfunction of bladder, unspecified: Secondary | ICD-10-CM | POA: Diagnosis not present

## 2019-04-14 DIAGNOSIS — I129 Hypertensive chronic kidney disease with stage 1 through stage 4 chronic kidney disease, or unspecified chronic kidney disease: Secondary | ICD-10-CM | POA: Diagnosis not present

## 2019-04-14 DIAGNOSIS — N189 Chronic kidney disease, unspecified: Secondary | ICD-10-CM | POA: Diagnosis not present

## 2019-04-14 DIAGNOSIS — D631 Anemia in chronic kidney disease: Secondary | ICD-10-CM | POA: Diagnosis not present

## 2019-04-14 DIAGNOSIS — I69351 Hemiplegia and hemiparesis following cerebral infarction affecting right dominant side: Secondary | ICD-10-CM | POA: Diagnosis not present

## 2019-04-14 DIAGNOSIS — L89310 Pressure ulcer of right buttock, unstageable: Secondary | ICD-10-CM | POA: Diagnosis not present

## 2019-04-22 DIAGNOSIS — D631 Anemia in chronic kidney disease: Secondary | ICD-10-CM | POA: Diagnosis not present

## 2019-04-22 DIAGNOSIS — L89310 Pressure ulcer of right buttock, unstageable: Secondary | ICD-10-CM | POA: Diagnosis not present

## 2019-04-22 DIAGNOSIS — N189 Chronic kidney disease, unspecified: Secondary | ICD-10-CM | POA: Diagnosis not present

## 2019-04-22 DIAGNOSIS — I129 Hypertensive chronic kidney disease with stage 1 through stage 4 chronic kidney disease, or unspecified chronic kidney disease: Secondary | ICD-10-CM | POA: Diagnosis not present

## 2019-04-22 DIAGNOSIS — N319 Neuromuscular dysfunction of bladder, unspecified: Secondary | ICD-10-CM | POA: Diagnosis not present

## 2019-04-22 DIAGNOSIS — I69351 Hemiplegia and hemiparesis following cerebral infarction affecting right dominant side: Secondary | ICD-10-CM | POA: Diagnosis not present

## 2019-04-28 DIAGNOSIS — Z0000 Encounter for general adult medical examination without abnormal findings: Secondary | ICD-10-CM | POA: Diagnosis not present

## 2019-04-28 DIAGNOSIS — Z1331 Encounter for screening for depression: Secondary | ICD-10-CM | POA: Diagnosis not present

## 2019-04-28 DIAGNOSIS — I10 Essential (primary) hypertension: Secondary | ICD-10-CM | POA: Diagnosis not present

## 2019-04-28 DIAGNOSIS — Z1339 Encounter for screening examination for other mental health and behavioral disorders: Secondary | ICD-10-CM | POA: Diagnosis not present

## 2019-04-28 DIAGNOSIS — Z6825 Body mass index (BMI) 25.0-25.9, adult: Secondary | ICD-10-CM | POA: Diagnosis not present

## 2019-04-28 DIAGNOSIS — G8191 Hemiplegia, unspecified affecting right dominant side: Secondary | ICD-10-CM | POA: Diagnosis not present

## 2019-04-28 DIAGNOSIS — Z299 Encounter for prophylactic measures, unspecified: Secondary | ICD-10-CM | POA: Diagnosis not present

## 2019-04-28 DIAGNOSIS — Z7189 Other specified counseling: Secondary | ICD-10-CM | POA: Diagnosis not present

## 2019-04-28 DIAGNOSIS — I82409 Acute embolism and thrombosis of unspecified deep veins of unspecified lower extremity: Secondary | ICD-10-CM | POA: Diagnosis not present

## 2019-04-29 DIAGNOSIS — L89310 Pressure ulcer of right buttock, unstageable: Secondary | ICD-10-CM | POA: Diagnosis not present

## 2019-04-29 DIAGNOSIS — N319 Neuromuscular dysfunction of bladder, unspecified: Secondary | ICD-10-CM | POA: Diagnosis not present

## 2019-04-29 DIAGNOSIS — I129 Hypertensive chronic kidney disease with stage 1 through stage 4 chronic kidney disease, or unspecified chronic kidney disease: Secondary | ICD-10-CM | POA: Diagnosis not present

## 2019-04-29 DIAGNOSIS — D631 Anemia in chronic kidney disease: Secondary | ICD-10-CM | POA: Diagnosis not present

## 2019-04-29 DIAGNOSIS — N189 Chronic kidney disease, unspecified: Secondary | ICD-10-CM | POA: Diagnosis not present

## 2019-04-29 DIAGNOSIS — I69351 Hemiplegia and hemiparesis following cerebral infarction affecting right dominant side: Secondary | ICD-10-CM | POA: Diagnosis not present

## 2019-05-01 DIAGNOSIS — N189 Chronic kidney disease, unspecified: Secondary | ICD-10-CM | POA: Diagnosis not present

## 2019-05-01 DIAGNOSIS — Z9181 History of falling: Secondary | ICD-10-CM | POA: Diagnosis not present

## 2019-05-01 DIAGNOSIS — Z435 Encounter for attention to cystostomy: Secondary | ICD-10-CM | POA: Diagnosis not present

## 2019-05-01 DIAGNOSIS — Z86718 Personal history of other venous thrombosis and embolism: Secondary | ICD-10-CM | POA: Diagnosis not present

## 2019-05-01 DIAGNOSIS — D631 Anemia in chronic kidney disease: Secondary | ICD-10-CM | POA: Diagnosis not present

## 2019-05-01 DIAGNOSIS — Z79899 Other long term (current) drug therapy: Secondary | ICD-10-CM | POA: Diagnosis not present

## 2019-05-01 DIAGNOSIS — Z7902 Long term (current) use of antithrombotics/antiplatelets: Secondary | ICD-10-CM | POA: Diagnosis not present

## 2019-05-01 DIAGNOSIS — S42211D Unspecified displaced fracture of surgical neck of right humerus, subsequent encounter for fracture with routine healing: Secondary | ICD-10-CM | POA: Diagnosis not present

## 2019-05-01 DIAGNOSIS — Z4800 Encounter for change or removal of nonsurgical wound dressing: Secondary | ICD-10-CM | POA: Diagnosis not present

## 2019-05-01 DIAGNOSIS — L89313 Pressure ulcer of right buttock, stage 3: Secondary | ICD-10-CM | POA: Diagnosis not present

## 2019-05-01 DIAGNOSIS — I129 Hypertensive chronic kidney disease with stage 1 through stage 4 chronic kidney disease, or unspecified chronic kidney disease: Secondary | ICD-10-CM | POA: Diagnosis not present

## 2019-05-01 DIAGNOSIS — N319 Neuromuscular dysfunction of bladder, unspecified: Secondary | ICD-10-CM | POA: Diagnosis not present

## 2019-05-01 DIAGNOSIS — I69351 Hemiplegia and hemiparesis following cerebral infarction affecting right dominant side: Secondary | ICD-10-CM | POA: Diagnosis not present

## 2019-05-04 DIAGNOSIS — L89313 Pressure ulcer of right buttock, stage 3: Secondary | ICD-10-CM | POA: Diagnosis not present

## 2019-05-04 DIAGNOSIS — N319 Neuromuscular dysfunction of bladder, unspecified: Secondary | ICD-10-CM | POA: Diagnosis not present

## 2019-05-04 DIAGNOSIS — D631 Anemia in chronic kidney disease: Secondary | ICD-10-CM | POA: Diagnosis not present

## 2019-05-04 DIAGNOSIS — I69351 Hemiplegia and hemiparesis following cerebral infarction affecting right dominant side: Secondary | ICD-10-CM | POA: Diagnosis not present

## 2019-05-04 DIAGNOSIS — I129 Hypertensive chronic kidney disease with stage 1 through stage 4 chronic kidney disease, or unspecified chronic kidney disease: Secondary | ICD-10-CM | POA: Diagnosis not present

## 2019-05-04 DIAGNOSIS — N189 Chronic kidney disease, unspecified: Secondary | ICD-10-CM | POA: Diagnosis not present

## 2019-05-05 DIAGNOSIS — I69351 Hemiplegia and hemiparesis following cerebral infarction affecting right dominant side: Secondary | ICD-10-CM | POA: Diagnosis not present

## 2019-05-05 DIAGNOSIS — E7800 Pure hypercholesterolemia, unspecified: Secondary | ICD-10-CM | POA: Diagnosis not present

## 2019-05-05 DIAGNOSIS — I129 Hypertensive chronic kidney disease with stage 1 through stage 4 chronic kidney disease, or unspecified chronic kidney disease: Secondary | ICD-10-CM | POA: Diagnosis not present

## 2019-05-05 DIAGNOSIS — N189 Chronic kidney disease, unspecified: Secondary | ICD-10-CM | POA: Diagnosis not present

## 2019-05-05 DIAGNOSIS — L89313 Pressure ulcer of right buttock, stage 3: Secondary | ICD-10-CM | POA: Diagnosis not present

## 2019-05-05 DIAGNOSIS — D631 Anemia in chronic kidney disease: Secondary | ICD-10-CM | POA: Diagnosis not present

## 2019-05-05 DIAGNOSIS — N319 Neuromuscular dysfunction of bladder, unspecified: Secondary | ICD-10-CM | POA: Diagnosis not present

## 2019-05-05 DIAGNOSIS — I639 Cerebral infarction, unspecified: Secondary | ICD-10-CM | POA: Diagnosis not present

## 2019-05-11 DIAGNOSIS — N319 Neuromuscular dysfunction of bladder, unspecified: Secondary | ICD-10-CM | POA: Diagnosis not present

## 2019-05-11 DIAGNOSIS — I10 Essential (primary) hypertension: Secondary | ICD-10-CM | POA: Diagnosis not present

## 2019-05-11 DIAGNOSIS — B356 Tinea cruris: Secondary | ICD-10-CM | POA: Diagnosis not present

## 2019-05-11 DIAGNOSIS — D631 Anemia in chronic kidney disease: Secondary | ICD-10-CM | POA: Diagnosis not present

## 2019-05-11 DIAGNOSIS — L89313 Pressure ulcer of right buttock, stage 3: Secondary | ICD-10-CM | POA: Diagnosis not present

## 2019-05-11 DIAGNOSIS — I69351 Hemiplegia and hemiparesis following cerebral infarction affecting right dominant side: Secondary | ICD-10-CM | POA: Diagnosis not present

## 2019-05-11 DIAGNOSIS — N189 Chronic kidney disease, unspecified: Secondary | ICD-10-CM | POA: Diagnosis not present

## 2019-05-11 DIAGNOSIS — I129 Hypertensive chronic kidney disease with stage 1 through stage 4 chronic kidney disease, or unspecified chronic kidney disease: Secondary | ICD-10-CM | POA: Diagnosis not present

## 2019-05-14 DIAGNOSIS — N189 Chronic kidney disease, unspecified: Secondary | ICD-10-CM | POA: Diagnosis not present

## 2019-05-14 DIAGNOSIS — D631 Anemia in chronic kidney disease: Secondary | ICD-10-CM | POA: Diagnosis not present

## 2019-05-14 DIAGNOSIS — I129 Hypertensive chronic kidney disease with stage 1 through stage 4 chronic kidney disease, or unspecified chronic kidney disease: Secondary | ICD-10-CM | POA: Diagnosis not present

## 2019-05-14 DIAGNOSIS — N319 Neuromuscular dysfunction of bladder, unspecified: Secondary | ICD-10-CM | POA: Diagnosis not present

## 2019-05-14 DIAGNOSIS — L89313 Pressure ulcer of right buttock, stage 3: Secondary | ICD-10-CM | POA: Diagnosis not present

## 2019-05-14 DIAGNOSIS — I69351 Hemiplegia and hemiparesis following cerebral infarction affecting right dominant side: Secondary | ICD-10-CM | POA: Diagnosis not present

## 2019-05-18 DIAGNOSIS — L89313 Pressure ulcer of right buttock, stage 3: Secondary | ICD-10-CM | POA: Diagnosis not present

## 2019-05-18 DIAGNOSIS — D631 Anemia in chronic kidney disease: Secondary | ICD-10-CM | POA: Diagnosis not present

## 2019-05-18 DIAGNOSIS — I129 Hypertensive chronic kidney disease with stage 1 through stage 4 chronic kidney disease, or unspecified chronic kidney disease: Secondary | ICD-10-CM | POA: Diagnosis not present

## 2019-05-18 DIAGNOSIS — N319 Neuromuscular dysfunction of bladder, unspecified: Secondary | ICD-10-CM | POA: Diagnosis not present

## 2019-05-18 DIAGNOSIS — I69351 Hemiplegia and hemiparesis following cerebral infarction affecting right dominant side: Secondary | ICD-10-CM | POA: Diagnosis not present

## 2019-05-18 DIAGNOSIS — N189 Chronic kidney disease, unspecified: Secondary | ICD-10-CM | POA: Diagnosis not present

## 2019-05-19 DIAGNOSIS — I69351 Hemiplegia and hemiparesis following cerebral infarction affecting right dominant side: Secondary | ICD-10-CM | POA: Diagnosis not present

## 2019-05-19 DIAGNOSIS — Z79899 Other long term (current) drug therapy: Secondary | ICD-10-CM | POA: Diagnosis not present

## 2019-05-19 DIAGNOSIS — N189 Chronic kidney disease, unspecified: Secondary | ICD-10-CM | POA: Diagnosis not present

## 2019-05-19 DIAGNOSIS — N319 Neuromuscular dysfunction of bladder, unspecified: Secondary | ICD-10-CM | POA: Diagnosis not present

## 2019-05-19 DIAGNOSIS — I10 Essential (primary) hypertension: Secondary | ICD-10-CM | POA: Diagnosis not present

## 2019-05-19 DIAGNOSIS — L89313 Pressure ulcer of right buttock, stage 3: Secondary | ICD-10-CM | POA: Diagnosis not present

## 2019-05-19 DIAGNOSIS — R5383 Other fatigue: Secondary | ICD-10-CM | POA: Diagnosis not present

## 2019-05-19 DIAGNOSIS — D631 Anemia in chronic kidney disease: Secondary | ICD-10-CM | POA: Diagnosis not present

## 2019-05-19 DIAGNOSIS — I129 Hypertensive chronic kidney disease with stage 1 through stage 4 chronic kidney disease, or unspecified chronic kidney disease: Secondary | ICD-10-CM | POA: Diagnosis not present

## 2019-05-20 DIAGNOSIS — N319 Neuromuscular dysfunction of bladder, unspecified: Secondary | ICD-10-CM | POA: Diagnosis not present

## 2019-05-20 DIAGNOSIS — I69351 Hemiplegia and hemiparesis following cerebral infarction affecting right dominant side: Secondary | ICD-10-CM | POA: Diagnosis not present

## 2019-05-20 DIAGNOSIS — L89313 Pressure ulcer of right buttock, stage 3: Secondary | ICD-10-CM | POA: Diagnosis not present

## 2019-05-20 DIAGNOSIS — D631 Anemia in chronic kidney disease: Secondary | ICD-10-CM | POA: Diagnosis not present

## 2019-05-20 DIAGNOSIS — I129 Hypertensive chronic kidney disease with stage 1 through stage 4 chronic kidney disease, or unspecified chronic kidney disease: Secondary | ICD-10-CM | POA: Diagnosis not present

## 2019-05-20 DIAGNOSIS — N189 Chronic kidney disease, unspecified: Secondary | ICD-10-CM | POA: Diagnosis not present

## 2019-05-24 DIAGNOSIS — N319 Neuromuscular dysfunction of bladder, unspecified: Secondary | ICD-10-CM | POA: Diagnosis not present

## 2019-05-24 DIAGNOSIS — I69351 Hemiplegia and hemiparesis following cerebral infarction affecting right dominant side: Secondary | ICD-10-CM | POA: Diagnosis not present

## 2019-05-24 DIAGNOSIS — L89313 Pressure ulcer of right buttock, stage 3: Secondary | ICD-10-CM | POA: Diagnosis not present

## 2019-05-24 DIAGNOSIS — D631 Anemia in chronic kidney disease: Secondary | ICD-10-CM | POA: Diagnosis not present

## 2019-05-24 DIAGNOSIS — I129 Hypertensive chronic kidney disease with stage 1 through stage 4 chronic kidney disease, or unspecified chronic kidney disease: Secondary | ICD-10-CM | POA: Diagnosis not present

## 2019-05-24 DIAGNOSIS — N189 Chronic kidney disease, unspecified: Secondary | ICD-10-CM | POA: Diagnosis not present

## 2019-05-25 DIAGNOSIS — N319 Neuromuscular dysfunction of bladder, unspecified: Secondary | ICD-10-CM | POA: Diagnosis not present

## 2019-05-25 DIAGNOSIS — D631 Anemia in chronic kidney disease: Secondary | ICD-10-CM | POA: Diagnosis not present

## 2019-05-25 DIAGNOSIS — I129 Hypertensive chronic kidney disease with stage 1 through stage 4 chronic kidney disease, or unspecified chronic kidney disease: Secondary | ICD-10-CM | POA: Diagnosis not present

## 2019-05-25 DIAGNOSIS — I69351 Hemiplegia and hemiparesis following cerebral infarction affecting right dominant side: Secondary | ICD-10-CM | POA: Diagnosis not present

## 2019-05-25 DIAGNOSIS — N189 Chronic kidney disease, unspecified: Secondary | ICD-10-CM | POA: Diagnosis not present

## 2019-05-25 DIAGNOSIS — L89313 Pressure ulcer of right buttock, stage 3: Secondary | ICD-10-CM | POA: Diagnosis not present

## 2019-05-26 DIAGNOSIS — L89313 Pressure ulcer of right buttock, stage 3: Secondary | ICD-10-CM | POA: Diagnosis not present

## 2019-05-26 DIAGNOSIS — I69351 Hemiplegia and hemiparesis following cerebral infarction affecting right dominant side: Secondary | ICD-10-CM | POA: Diagnosis not present

## 2019-05-26 DIAGNOSIS — I129 Hypertensive chronic kidney disease with stage 1 through stage 4 chronic kidney disease, or unspecified chronic kidney disease: Secondary | ICD-10-CM | POA: Diagnosis not present

## 2019-05-26 DIAGNOSIS — N189 Chronic kidney disease, unspecified: Secondary | ICD-10-CM | POA: Diagnosis not present

## 2019-05-26 DIAGNOSIS — D631 Anemia in chronic kidney disease: Secondary | ICD-10-CM | POA: Diagnosis not present

## 2019-05-26 DIAGNOSIS — N319 Neuromuscular dysfunction of bladder, unspecified: Secondary | ICD-10-CM | POA: Diagnosis not present

## 2019-05-27 DIAGNOSIS — N189 Chronic kidney disease, unspecified: Secondary | ICD-10-CM | POA: Diagnosis not present

## 2019-05-27 DIAGNOSIS — N319 Neuromuscular dysfunction of bladder, unspecified: Secondary | ICD-10-CM | POA: Diagnosis not present

## 2019-05-27 DIAGNOSIS — L89313 Pressure ulcer of right buttock, stage 3: Secondary | ICD-10-CM | POA: Diagnosis not present

## 2019-05-27 DIAGNOSIS — D631 Anemia in chronic kidney disease: Secondary | ICD-10-CM | POA: Diagnosis not present

## 2019-05-27 DIAGNOSIS — I69351 Hemiplegia and hemiparesis following cerebral infarction affecting right dominant side: Secondary | ICD-10-CM | POA: Diagnosis not present

## 2019-05-27 DIAGNOSIS — I129 Hypertensive chronic kidney disease with stage 1 through stage 4 chronic kidney disease, or unspecified chronic kidney disease: Secondary | ICD-10-CM | POA: Diagnosis not present

## 2019-05-28 DIAGNOSIS — N189 Chronic kidney disease, unspecified: Secondary | ICD-10-CM | POA: Diagnosis not present

## 2019-05-28 DIAGNOSIS — N319 Neuromuscular dysfunction of bladder, unspecified: Secondary | ICD-10-CM | POA: Diagnosis not present

## 2019-05-28 DIAGNOSIS — I129 Hypertensive chronic kidney disease with stage 1 through stage 4 chronic kidney disease, or unspecified chronic kidney disease: Secondary | ICD-10-CM | POA: Diagnosis not present

## 2019-05-28 DIAGNOSIS — D631 Anemia in chronic kidney disease: Secondary | ICD-10-CM | POA: Diagnosis not present

## 2019-05-28 DIAGNOSIS — L89313 Pressure ulcer of right buttock, stage 3: Secondary | ICD-10-CM | POA: Diagnosis not present

## 2019-05-28 DIAGNOSIS — I69351 Hemiplegia and hemiparesis following cerebral infarction affecting right dominant side: Secondary | ICD-10-CM | POA: Diagnosis not present

## 2019-05-31 DIAGNOSIS — Z4800 Encounter for change or removal of nonsurgical wound dressing: Secondary | ICD-10-CM | POA: Diagnosis not present

## 2019-05-31 DIAGNOSIS — N319 Neuromuscular dysfunction of bladder, unspecified: Secondary | ICD-10-CM | POA: Diagnosis not present

## 2019-05-31 DIAGNOSIS — D631 Anemia in chronic kidney disease: Secondary | ICD-10-CM | POA: Diagnosis not present

## 2019-05-31 DIAGNOSIS — Z79899 Other long term (current) drug therapy: Secondary | ICD-10-CM | POA: Diagnosis not present

## 2019-05-31 DIAGNOSIS — I69351 Hemiplegia and hemiparesis following cerebral infarction affecting right dominant side: Secondary | ICD-10-CM | POA: Diagnosis not present

## 2019-05-31 DIAGNOSIS — Z86718 Personal history of other venous thrombosis and embolism: Secondary | ICD-10-CM | POA: Diagnosis not present

## 2019-05-31 DIAGNOSIS — Z435 Encounter for attention to cystostomy: Secondary | ICD-10-CM | POA: Diagnosis not present

## 2019-05-31 DIAGNOSIS — Z7902 Long term (current) use of antithrombotics/antiplatelets: Secondary | ICD-10-CM | POA: Diagnosis not present

## 2019-05-31 DIAGNOSIS — S42211D Unspecified displaced fracture of surgical neck of right humerus, subsequent encounter for fracture with routine healing: Secondary | ICD-10-CM | POA: Diagnosis not present

## 2019-05-31 DIAGNOSIS — N189 Chronic kidney disease, unspecified: Secondary | ICD-10-CM | POA: Diagnosis not present

## 2019-05-31 DIAGNOSIS — L89313 Pressure ulcer of right buttock, stage 3: Secondary | ICD-10-CM | POA: Diagnosis not present

## 2019-05-31 DIAGNOSIS — Z9181 History of falling: Secondary | ICD-10-CM | POA: Diagnosis not present

## 2019-05-31 DIAGNOSIS — I129 Hypertensive chronic kidney disease with stage 1 through stage 4 chronic kidney disease, or unspecified chronic kidney disease: Secondary | ICD-10-CM | POA: Diagnosis not present

## 2019-06-01 DIAGNOSIS — L89313 Pressure ulcer of right buttock, stage 3: Secondary | ICD-10-CM | POA: Diagnosis not present

## 2019-06-01 DIAGNOSIS — I129 Hypertensive chronic kidney disease with stage 1 through stage 4 chronic kidney disease, or unspecified chronic kidney disease: Secondary | ICD-10-CM | POA: Diagnosis not present

## 2019-06-01 DIAGNOSIS — I69351 Hemiplegia and hemiparesis following cerebral infarction affecting right dominant side: Secondary | ICD-10-CM | POA: Diagnosis not present

## 2019-06-01 DIAGNOSIS — N189 Chronic kidney disease, unspecified: Secondary | ICD-10-CM | POA: Diagnosis not present

## 2019-06-01 DIAGNOSIS — N319 Neuromuscular dysfunction of bladder, unspecified: Secondary | ICD-10-CM | POA: Diagnosis not present

## 2019-06-01 DIAGNOSIS — D631 Anemia in chronic kidney disease: Secondary | ICD-10-CM | POA: Diagnosis not present

## 2019-06-02 DIAGNOSIS — N319 Neuromuscular dysfunction of bladder, unspecified: Secondary | ICD-10-CM | POA: Diagnosis not present

## 2019-06-02 DIAGNOSIS — L89313 Pressure ulcer of right buttock, stage 3: Secondary | ICD-10-CM | POA: Diagnosis not present

## 2019-06-02 DIAGNOSIS — I69351 Hemiplegia and hemiparesis following cerebral infarction affecting right dominant side: Secondary | ICD-10-CM | POA: Diagnosis not present

## 2019-06-02 DIAGNOSIS — D631 Anemia in chronic kidney disease: Secondary | ICD-10-CM | POA: Diagnosis not present

## 2019-06-02 DIAGNOSIS — I129 Hypertensive chronic kidney disease with stage 1 through stage 4 chronic kidney disease, or unspecified chronic kidney disease: Secondary | ICD-10-CM | POA: Diagnosis not present

## 2019-06-02 DIAGNOSIS — N189 Chronic kidney disease, unspecified: Secondary | ICD-10-CM | POA: Diagnosis not present

## 2019-06-03 DIAGNOSIS — L89313 Pressure ulcer of right buttock, stage 3: Secondary | ICD-10-CM | POA: Diagnosis not present

## 2019-06-03 DIAGNOSIS — N319 Neuromuscular dysfunction of bladder, unspecified: Secondary | ICD-10-CM | POA: Diagnosis not present

## 2019-06-03 DIAGNOSIS — N189 Chronic kidney disease, unspecified: Secondary | ICD-10-CM | POA: Diagnosis not present

## 2019-06-03 DIAGNOSIS — I69351 Hemiplegia and hemiparesis following cerebral infarction affecting right dominant side: Secondary | ICD-10-CM | POA: Diagnosis not present

## 2019-06-03 DIAGNOSIS — I129 Hypertensive chronic kidney disease with stage 1 through stage 4 chronic kidney disease, or unspecified chronic kidney disease: Secondary | ICD-10-CM | POA: Diagnosis not present

## 2019-06-03 DIAGNOSIS — D631 Anemia in chronic kidney disease: Secondary | ICD-10-CM | POA: Diagnosis not present

## 2019-06-04 DIAGNOSIS — G8191 Hemiplegia, unspecified affecting right dominant side: Secondary | ICD-10-CM | POA: Diagnosis not present

## 2019-06-04 DIAGNOSIS — I10 Essential (primary) hypertension: Secondary | ICD-10-CM | POA: Diagnosis not present

## 2019-06-04 DIAGNOSIS — Z299 Encounter for prophylactic measures, unspecified: Secondary | ICD-10-CM | POA: Diagnosis not present

## 2019-06-04 DIAGNOSIS — R42 Dizziness and giddiness: Secondary | ICD-10-CM | POA: Diagnosis not present

## 2019-06-04 DIAGNOSIS — Z789 Other specified health status: Secondary | ICD-10-CM | POA: Diagnosis not present

## 2019-06-08 DIAGNOSIS — L89313 Pressure ulcer of right buttock, stage 3: Secondary | ICD-10-CM | POA: Diagnosis not present

## 2019-06-08 DIAGNOSIS — I69351 Hemiplegia and hemiparesis following cerebral infarction affecting right dominant side: Secondary | ICD-10-CM | POA: Diagnosis not present

## 2019-06-08 DIAGNOSIS — N189 Chronic kidney disease, unspecified: Secondary | ICD-10-CM | POA: Diagnosis not present

## 2019-06-08 DIAGNOSIS — D631 Anemia in chronic kidney disease: Secondary | ICD-10-CM | POA: Diagnosis not present

## 2019-06-08 DIAGNOSIS — I129 Hypertensive chronic kidney disease with stage 1 through stage 4 chronic kidney disease, or unspecified chronic kidney disease: Secondary | ICD-10-CM | POA: Diagnosis not present

## 2019-06-08 DIAGNOSIS — N319 Neuromuscular dysfunction of bladder, unspecified: Secondary | ICD-10-CM | POA: Diagnosis not present

## 2019-06-09 DIAGNOSIS — L89313 Pressure ulcer of right buttock, stage 3: Secondary | ICD-10-CM | POA: Diagnosis not present

## 2019-06-10 DIAGNOSIS — I10 Essential (primary) hypertension: Secondary | ICD-10-CM | POA: Diagnosis not present

## 2019-06-10 DIAGNOSIS — I69351 Hemiplegia and hemiparesis following cerebral infarction affecting right dominant side: Secondary | ICD-10-CM | POA: Diagnosis not present

## 2019-06-10 DIAGNOSIS — N189 Chronic kidney disease, unspecified: Secondary | ICD-10-CM | POA: Diagnosis not present

## 2019-06-10 DIAGNOSIS — L89313 Pressure ulcer of right buttock, stage 3: Secondary | ICD-10-CM | POA: Diagnosis not present

## 2019-06-10 DIAGNOSIS — N319 Neuromuscular dysfunction of bladder, unspecified: Secondary | ICD-10-CM | POA: Diagnosis not present

## 2019-06-10 DIAGNOSIS — D631 Anemia in chronic kidney disease: Secondary | ICD-10-CM | POA: Diagnosis not present

## 2019-06-10 DIAGNOSIS — I129 Hypertensive chronic kidney disease with stage 1 through stage 4 chronic kidney disease, or unspecified chronic kidney disease: Secondary | ICD-10-CM | POA: Diagnosis not present

## 2019-06-11 DIAGNOSIS — I69351 Hemiplegia and hemiparesis following cerebral infarction affecting right dominant side: Secondary | ICD-10-CM | POA: Diagnosis not present

## 2019-06-11 DIAGNOSIS — N189 Chronic kidney disease, unspecified: Secondary | ICD-10-CM | POA: Diagnosis not present

## 2019-06-11 DIAGNOSIS — I129 Hypertensive chronic kidney disease with stage 1 through stage 4 chronic kidney disease, or unspecified chronic kidney disease: Secondary | ICD-10-CM | POA: Diagnosis not present

## 2019-06-11 DIAGNOSIS — D631 Anemia in chronic kidney disease: Secondary | ICD-10-CM | POA: Diagnosis not present

## 2019-06-11 DIAGNOSIS — N319 Neuromuscular dysfunction of bladder, unspecified: Secondary | ICD-10-CM | POA: Diagnosis not present

## 2019-06-11 DIAGNOSIS — L89313 Pressure ulcer of right buttock, stage 3: Secondary | ICD-10-CM | POA: Diagnosis not present

## 2019-06-14 DIAGNOSIS — L89313 Pressure ulcer of right buttock, stage 3: Secondary | ICD-10-CM | POA: Diagnosis not present

## 2019-06-14 DIAGNOSIS — N319 Neuromuscular dysfunction of bladder, unspecified: Secondary | ICD-10-CM | POA: Diagnosis not present

## 2019-06-14 DIAGNOSIS — I129 Hypertensive chronic kidney disease with stage 1 through stage 4 chronic kidney disease, or unspecified chronic kidney disease: Secondary | ICD-10-CM | POA: Diagnosis not present

## 2019-06-14 DIAGNOSIS — N189 Chronic kidney disease, unspecified: Secondary | ICD-10-CM | POA: Diagnosis not present

## 2019-06-14 DIAGNOSIS — D631 Anemia in chronic kidney disease: Secondary | ICD-10-CM | POA: Diagnosis not present

## 2019-06-14 DIAGNOSIS — I69351 Hemiplegia and hemiparesis following cerebral infarction affecting right dominant side: Secondary | ICD-10-CM | POA: Diagnosis not present

## 2019-06-17 DIAGNOSIS — I69351 Hemiplegia and hemiparesis following cerebral infarction affecting right dominant side: Secondary | ICD-10-CM | POA: Diagnosis not present

## 2019-06-17 DIAGNOSIS — N319 Neuromuscular dysfunction of bladder, unspecified: Secondary | ICD-10-CM | POA: Diagnosis not present

## 2019-06-17 DIAGNOSIS — N189 Chronic kidney disease, unspecified: Secondary | ICD-10-CM | POA: Diagnosis not present

## 2019-06-17 DIAGNOSIS — L89313 Pressure ulcer of right buttock, stage 3: Secondary | ICD-10-CM | POA: Diagnosis not present

## 2019-06-17 DIAGNOSIS — I129 Hypertensive chronic kidney disease with stage 1 through stage 4 chronic kidney disease, or unspecified chronic kidney disease: Secondary | ICD-10-CM | POA: Diagnosis not present

## 2019-06-17 DIAGNOSIS — D631 Anemia in chronic kidney disease: Secondary | ICD-10-CM | POA: Diagnosis not present

## 2019-06-22 DIAGNOSIS — D631 Anemia in chronic kidney disease: Secondary | ICD-10-CM | POA: Diagnosis not present

## 2019-06-22 DIAGNOSIS — I69351 Hemiplegia and hemiparesis following cerebral infarction affecting right dominant side: Secondary | ICD-10-CM | POA: Diagnosis not present

## 2019-06-22 DIAGNOSIS — L89313 Pressure ulcer of right buttock, stage 3: Secondary | ICD-10-CM | POA: Diagnosis not present

## 2019-06-22 DIAGNOSIS — I129 Hypertensive chronic kidney disease with stage 1 through stage 4 chronic kidney disease, or unspecified chronic kidney disease: Secondary | ICD-10-CM | POA: Diagnosis not present

## 2019-06-22 DIAGNOSIS — N189 Chronic kidney disease, unspecified: Secondary | ICD-10-CM | POA: Diagnosis not present

## 2019-06-22 DIAGNOSIS — N319 Neuromuscular dysfunction of bladder, unspecified: Secondary | ICD-10-CM | POA: Diagnosis not present

## 2019-06-25 DIAGNOSIS — N189 Chronic kidney disease, unspecified: Secondary | ICD-10-CM | POA: Diagnosis not present

## 2019-06-25 DIAGNOSIS — N319 Neuromuscular dysfunction of bladder, unspecified: Secondary | ICD-10-CM | POA: Diagnosis not present

## 2019-06-25 DIAGNOSIS — I129 Hypertensive chronic kidney disease with stage 1 through stage 4 chronic kidney disease, or unspecified chronic kidney disease: Secondary | ICD-10-CM | POA: Diagnosis not present

## 2019-06-25 DIAGNOSIS — D631 Anemia in chronic kidney disease: Secondary | ICD-10-CM | POA: Diagnosis not present

## 2019-06-25 DIAGNOSIS — L89313 Pressure ulcer of right buttock, stage 3: Secondary | ICD-10-CM | POA: Diagnosis not present

## 2019-06-25 DIAGNOSIS — I69351 Hemiplegia and hemiparesis following cerebral infarction affecting right dominant side: Secondary | ICD-10-CM | POA: Diagnosis not present

## 2019-06-28 DIAGNOSIS — N189 Chronic kidney disease, unspecified: Secondary | ICD-10-CM | POA: Diagnosis not present

## 2019-06-28 DIAGNOSIS — I69351 Hemiplegia and hemiparesis following cerebral infarction affecting right dominant side: Secondary | ICD-10-CM | POA: Diagnosis not present

## 2019-06-28 DIAGNOSIS — L89313 Pressure ulcer of right buttock, stage 3: Secondary | ICD-10-CM | POA: Diagnosis not present

## 2019-06-28 DIAGNOSIS — I129 Hypertensive chronic kidney disease with stage 1 through stage 4 chronic kidney disease, or unspecified chronic kidney disease: Secondary | ICD-10-CM | POA: Diagnosis not present

## 2019-06-28 DIAGNOSIS — D631 Anemia in chronic kidney disease: Secondary | ICD-10-CM | POA: Diagnosis not present

## 2019-06-28 DIAGNOSIS — N319 Neuromuscular dysfunction of bladder, unspecified: Secondary | ICD-10-CM | POA: Diagnosis not present

## 2019-06-30 DIAGNOSIS — I129 Hypertensive chronic kidney disease with stage 1 through stage 4 chronic kidney disease, or unspecified chronic kidney disease: Secondary | ICD-10-CM | POA: Diagnosis not present

## 2019-06-30 DIAGNOSIS — Z86718 Personal history of other venous thrombosis and embolism: Secondary | ICD-10-CM | POA: Diagnosis not present

## 2019-06-30 DIAGNOSIS — Z79899 Other long term (current) drug therapy: Secondary | ICD-10-CM | POA: Diagnosis not present

## 2019-06-30 DIAGNOSIS — N189 Chronic kidney disease, unspecified: Secondary | ICD-10-CM | POA: Diagnosis not present

## 2019-06-30 DIAGNOSIS — Z4800 Encounter for change or removal of nonsurgical wound dressing: Secondary | ICD-10-CM | POA: Diagnosis not present

## 2019-06-30 DIAGNOSIS — I69351 Hemiplegia and hemiparesis following cerebral infarction affecting right dominant side: Secondary | ICD-10-CM | POA: Diagnosis not present

## 2019-06-30 DIAGNOSIS — D631 Anemia in chronic kidney disease: Secondary | ICD-10-CM | POA: Diagnosis not present

## 2019-06-30 DIAGNOSIS — L89313 Pressure ulcer of right buttock, stage 3: Secondary | ICD-10-CM | POA: Diagnosis not present

## 2019-06-30 DIAGNOSIS — Z7902 Long term (current) use of antithrombotics/antiplatelets: Secondary | ICD-10-CM | POA: Diagnosis not present

## 2019-06-30 DIAGNOSIS — Z435 Encounter for attention to cystostomy: Secondary | ICD-10-CM | POA: Diagnosis not present

## 2019-06-30 DIAGNOSIS — N319 Neuromuscular dysfunction of bladder, unspecified: Secondary | ICD-10-CM | POA: Diagnosis not present

## 2019-07-07 DIAGNOSIS — I129 Hypertensive chronic kidney disease with stage 1 through stage 4 chronic kidney disease, or unspecified chronic kidney disease: Secondary | ICD-10-CM | POA: Diagnosis not present

## 2019-07-07 DIAGNOSIS — Z4800 Encounter for change or removal of nonsurgical wound dressing: Secondary | ICD-10-CM | POA: Diagnosis not present

## 2019-07-07 DIAGNOSIS — I10 Essential (primary) hypertension: Secondary | ICD-10-CM | POA: Diagnosis not present

## 2019-07-07 DIAGNOSIS — L89313 Pressure ulcer of right buttock, stage 3: Secondary | ICD-10-CM | POA: Diagnosis not present

## 2019-07-07 DIAGNOSIS — Z435 Encounter for attention to cystostomy: Secondary | ICD-10-CM | POA: Diagnosis not present

## 2019-07-07 DIAGNOSIS — N319 Neuromuscular dysfunction of bladder, unspecified: Secondary | ICD-10-CM | POA: Diagnosis not present

## 2019-07-07 DIAGNOSIS — D631 Anemia in chronic kidney disease: Secondary | ICD-10-CM | POA: Diagnosis not present

## 2019-07-07 DIAGNOSIS — N189 Chronic kidney disease, unspecified: Secondary | ICD-10-CM | POA: Diagnosis not present

## 2019-07-12 DIAGNOSIS — I639 Cerebral infarction, unspecified: Secondary | ICD-10-CM | POA: Diagnosis not present

## 2019-07-12 DIAGNOSIS — E7800 Pure hypercholesterolemia, unspecified: Secondary | ICD-10-CM | POA: Diagnosis not present

## 2019-07-12 DIAGNOSIS — Z4800 Encounter for change or removal of nonsurgical wound dressing: Secondary | ICD-10-CM | POA: Diagnosis not present

## 2019-07-12 DIAGNOSIS — L89313 Pressure ulcer of right buttock, stage 3: Secondary | ICD-10-CM | POA: Diagnosis not present

## 2019-07-12 DIAGNOSIS — Z435 Encounter for attention to cystostomy: Secondary | ICD-10-CM | POA: Diagnosis not present

## 2019-07-12 DIAGNOSIS — N189 Chronic kidney disease, unspecified: Secondary | ICD-10-CM | POA: Diagnosis not present

## 2019-07-12 DIAGNOSIS — N319 Neuromuscular dysfunction of bladder, unspecified: Secondary | ICD-10-CM | POA: Diagnosis not present

## 2019-07-12 DIAGNOSIS — I129 Hypertensive chronic kidney disease with stage 1 through stage 4 chronic kidney disease, or unspecified chronic kidney disease: Secondary | ICD-10-CM | POA: Diagnosis not present

## 2019-07-13 DIAGNOSIS — L89313 Pressure ulcer of right buttock, stage 3: Secondary | ICD-10-CM | POA: Diagnosis not present

## 2019-07-13 DIAGNOSIS — N319 Neuromuscular dysfunction of bladder, unspecified: Secondary | ICD-10-CM | POA: Diagnosis not present

## 2019-07-13 DIAGNOSIS — Z4800 Encounter for change or removal of nonsurgical wound dressing: Secondary | ICD-10-CM | POA: Diagnosis not present

## 2019-07-13 DIAGNOSIS — I129 Hypertensive chronic kidney disease with stage 1 through stage 4 chronic kidney disease, or unspecified chronic kidney disease: Secondary | ICD-10-CM | POA: Diagnosis not present

## 2019-07-13 DIAGNOSIS — N189 Chronic kidney disease, unspecified: Secondary | ICD-10-CM | POA: Diagnosis not present

## 2019-07-13 DIAGNOSIS — Z435 Encounter for attention to cystostomy: Secondary | ICD-10-CM | POA: Diagnosis not present

## 2019-07-15 DIAGNOSIS — N189 Chronic kidney disease, unspecified: Secondary | ICD-10-CM | POA: Diagnosis not present

## 2019-07-15 DIAGNOSIS — Z435 Encounter for attention to cystostomy: Secondary | ICD-10-CM | POA: Diagnosis not present

## 2019-07-15 DIAGNOSIS — Z4800 Encounter for change or removal of nonsurgical wound dressing: Secondary | ICD-10-CM | POA: Diagnosis not present

## 2019-07-15 DIAGNOSIS — L89313 Pressure ulcer of right buttock, stage 3: Secondary | ICD-10-CM | POA: Diagnosis not present

## 2019-07-15 DIAGNOSIS — N319 Neuromuscular dysfunction of bladder, unspecified: Secondary | ICD-10-CM | POA: Diagnosis not present

## 2019-07-15 DIAGNOSIS — I129 Hypertensive chronic kidney disease with stage 1 through stage 4 chronic kidney disease, or unspecified chronic kidney disease: Secondary | ICD-10-CM | POA: Diagnosis not present

## 2019-07-15 DIAGNOSIS — D631 Anemia in chronic kidney disease: Secondary | ICD-10-CM | POA: Diagnosis not present

## 2019-07-20 DIAGNOSIS — Z435 Encounter for attention to cystostomy: Secondary | ICD-10-CM | POA: Diagnosis not present

## 2019-07-20 DIAGNOSIS — N319 Neuromuscular dysfunction of bladder, unspecified: Secondary | ICD-10-CM | POA: Diagnosis not present

## 2019-07-20 DIAGNOSIS — N189 Chronic kidney disease, unspecified: Secondary | ICD-10-CM | POA: Diagnosis not present

## 2019-07-20 DIAGNOSIS — Z4800 Encounter for change or removal of nonsurgical wound dressing: Secondary | ICD-10-CM | POA: Diagnosis not present

## 2019-07-20 DIAGNOSIS — L89313 Pressure ulcer of right buttock, stage 3: Secondary | ICD-10-CM | POA: Diagnosis not present

## 2019-07-20 DIAGNOSIS — I129 Hypertensive chronic kidney disease with stage 1 through stage 4 chronic kidney disease, or unspecified chronic kidney disease: Secondary | ICD-10-CM | POA: Diagnosis not present

## 2019-07-26 DIAGNOSIS — N189 Chronic kidney disease, unspecified: Secondary | ICD-10-CM | POA: Diagnosis not present

## 2019-07-26 DIAGNOSIS — Z4800 Encounter for change or removal of nonsurgical wound dressing: Secondary | ICD-10-CM | POA: Diagnosis not present

## 2019-07-26 DIAGNOSIS — L89313 Pressure ulcer of right buttock, stage 3: Secondary | ICD-10-CM | POA: Diagnosis not present

## 2019-07-26 DIAGNOSIS — I129 Hypertensive chronic kidney disease with stage 1 through stage 4 chronic kidney disease, or unspecified chronic kidney disease: Secondary | ICD-10-CM | POA: Diagnosis not present

## 2019-07-26 DIAGNOSIS — N319 Neuromuscular dysfunction of bladder, unspecified: Secondary | ICD-10-CM | POA: Diagnosis not present

## 2019-07-26 DIAGNOSIS — Z435 Encounter for attention to cystostomy: Secondary | ICD-10-CM | POA: Diagnosis not present

## 2019-07-30 DIAGNOSIS — Z86718 Personal history of other venous thrombosis and embolism: Secondary | ICD-10-CM | POA: Diagnosis not present

## 2019-07-30 DIAGNOSIS — N319 Neuromuscular dysfunction of bladder, unspecified: Secondary | ICD-10-CM | POA: Diagnosis not present

## 2019-07-30 DIAGNOSIS — Z7902 Long term (current) use of antithrombotics/antiplatelets: Secondary | ICD-10-CM | POA: Diagnosis not present

## 2019-07-30 DIAGNOSIS — Z79899 Other long term (current) drug therapy: Secondary | ICD-10-CM | POA: Diagnosis not present

## 2019-07-30 DIAGNOSIS — L89313 Pressure ulcer of right buttock, stage 3: Secondary | ICD-10-CM | POA: Diagnosis not present

## 2019-07-30 DIAGNOSIS — I69351 Hemiplegia and hemiparesis following cerebral infarction affecting right dominant side: Secondary | ICD-10-CM | POA: Diagnosis not present

## 2019-07-30 DIAGNOSIS — Z4800 Encounter for change or removal of nonsurgical wound dressing: Secondary | ICD-10-CM | POA: Diagnosis not present

## 2019-07-30 DIAGNOSIS — Z435 Encounter for attention to cystostomy: Secondary | ICD-10-CM | POA: Diagnosis not present

## 2019-07-30 DIAGNOSIS — I129 Hypertensive chronic kidney disease with stage 1 through stage 4 chronic kidney disease, or unspecified chronic kidney disease: Secondary | ICD-10-CM | POA: Diagnosis not present

## 2019-07-30 DIAGNOSIS — N189 Chronic kidney disease, unspecified: Secondary | ICD-10-CM | POA: Diagnosis not present

## 2019-07-30 DIAGNOSIS — D631 Anemia in chronic kidney disease: Secondary | ICD-10-CM | POA: Diagnosis not present

## 2019-08-02 DIAGNOSIS — N319 Neuromuscular dysfunction of bladder, unspecified: Secondary | ICD-10-CM | POA: Diagnosis not present

## 2019-08-02 DIAGNOSIS — E7800 Pure hypercholesterolemia, unspecified: Secondary | ICD-10-CM | POA: Diagnosis not present

## 2019-08-02 DIAGNOSIS — Z4800 Encounter for change or removal of nonsurgical wound dressing: Secondary | ICD-10-CM | POA: Diagnosis not present

## 2019-08-02 DIAGNOSIS — Z435 Encounter for attention to cystostomy: Secondary | ICD-10-CM | POA: Diagnosis not present

## 2019-08-02 DIAGNOSIS — I129 Hypertensive chronic kidney disease with stage 1 through stage 4 chronic kidney disease, or unspecified chronic kidney disease: Secondary | ICD-10-CM | POA: Diagnosis not present

## 2019-08-02 DIAGNOSIS — I639 Cerebral infarction, unspecified: Secondary | ICD-10-CM | POA: Diagnosis not present

## 2019-08-02 DIAGNOSIS — L89313 Pressure ulcer of right buttock, stage 3: Secondary | ICD-10-CM | POA: Diagnosis not present

## 2019-08-02 DIAGNOSIS — N189 Chronic kidney disease, unspecified: Secondary | ICD-10-CM | POA: Diagnosis not present

## 2019-08-05 DIAGNOSIS — I10 Essential (primary) hypertension: Secondary | ICD-10-CM | POA: Diagnosis not present

## 2019-08-17 DIAGNOSIS — G453 Amaurosis fugax: Secondary | ICD-10-CM | POA: Diagnosis not present

## 2019-08-17 DIAGNOSIS — H524 Presbyopia: Secondary | ICD-10-CM | POA: Diagnosis not present

## 2019-08-17 DIAGNOSIS — H401112 Primary open-angle glaucoma, right eye, moderate stage: Secondary | ICD-10-CM | POA: Diagnosis not present

## 2019-08-17 DIAGNOSIS — H401121 Primary open-angle glaucoma, left eye, mild stage: Secondary | ICD-10-CM | POA: Diagnosis not present

## 2019-08-17 DIAGNOSIS — Z961 Presence of intraocular lens: Secondary | ICD-10-CM | POA: Diagnosis not present

## 2019-08-19 DIAGNOSIS — I639 Cerebral infarction, unspecified: Secondary | ICD-10-CM | POA: Diagnosis not present

## 2019-08-19 DIAGNOSIS — E7800 Pure hypercholesterolemia, unspecified: Secondary | ICD-10-CM | POA: Diagnosis not present

## 2019-08-20 DIAGNOSIS — Z299 Encounter for prophylactic measures, unspecified: Secondary | ICD-10-CM | POA: Diagnosis not present

## 2019-08-20 DIAGNOSIS — G4700 Insomnia, unspecified: Secondary | ICD-10-CM | POA: Diagnosis not present

## 2019-08-20 DIAGNOSIS — G8191 Hemiplegia, unspecified affecting right dominant side: Secondary | ICD-10-CM | POA: Diagnosis not present

## 2019-08-20 DIAGNOSIS — K219 Gastro-esophageal reflux disease without esophagitis: Secondary | ICD-10-CM | POA: Diagnosis not present

## 2019-08-20 DIAGNOSIS — M1710 Unilateral primary osteoarthritis, unspecified knee: Secondary | ICD-10-CM | POA: Diagnosis not present

## 2019-08-20 DIAGNOSIS — I10 Essential (primary) hypertension: Secondary | ICD-10-CM | POA: Diagnosis not present

## 2019-08-20 DIAGNOSIS — Z6825 Body mass index (BMI) 25.0-25.9, adult: Secondary | ICD-10-CM | POA: Diagnosis not present

## 2019-08-30 DIAGNOSIS — R269 Unspecified abnormalities of gait and mobility: Secondary | ICD-10-CM | POA: Diagnosis not present

## 2019-08-30 DIAGNOSIS — R531 Weakness: Secondary | ICD-10-CM | POA: Diagnosis not present

## 2019-08-30 DIAGNOSIS — R262 Difficulty in walking, not elsewhere classified: Secondary | ICD-10-CM | POA: Diagnosis not present

## 2019-09-02 DIAGNOSIS — I10 Essential (primary) hypertension: Secondary | ICD-10-CM | POA: Diagnosis not present

## 2019-09-03 DIAGNOSIS — R262 Difficulty in walking, not elsewhere classified: Secondary | ICD-10-CM | POA: Diagnosis not present

## 2019-09-03 DIAGNOSIS — R269 Unspecified abnormalities of gait and mobility: Secondary | ICD-10-CM | POA: Diagnosis not present

## 2019-09-03 DIAGNOSIS — R531 Weakness: Secondary | ICD-10-CM | POA: Diagnosis not present

## 2019-09-06 DIAGNOSIS — R531 Weakness: Secondary | ICD-10-CM | POA: Diagnosis not present

## 2019-09-06 DIAGNOSIS — R269 Unspecified abnormalities of gait and mobility: Secondary | ICD-10-CM | POA: Diagnosis not present

## 2019-09-06 DIAGNOSIS — R262 Difficulty in walking, not elsewhere classified: Secondary | ICD-10-CM | POA: Diagnosis not present

## 2019-09-09 DIAGNOSIS — R531 Weakness: Secondary | ICD-10-CM | POA: Diagnosis not present

## 2019-09-09 DIAGNOSIS — R262 Difficulty in walking, not elsewhere classified: Secondary | ICD-10-CM | POA: Diagnosis not present

## 2019-09-09 DIAGNOSIS — R269 Unspecified abnormalities of gait and mobility: Secondary | ICD-10-CM | POA: Diagnosis not present

## 2019-09-13 DIAGNOSIS — R531 Weakness: Secondary | ICD-10-CM | POA: Diagnosis not present

## 2019-09-13 DIAGNOSIS — R262 Difficulty in walking, not elsewhere classified: Secondary | ICD-10-CM | POA: Diagnosis not present

## 2019-09-13 DIAGNOSIS — R269 Unspecified abnormalities of gait and mobility: Secondary | ICD-10-CM | POA: Diagnosis not present

## 2019-09-16 DIAGNOSIS — R269 Unspecified abnormalities of gait and mobility: Secondary | ICD-10-CM | POA: Diagnosis not present

## 2019-09-16 DIAGNOSIS — R531 Weakness: Secondary | ICD-10-CM | POA: Diagnosis not present

## 2019-09-16 DIAGNOSIS — R262 Difficulty in walking, not elsewhere classified: Secondary | ICD-10-CM | POA: Diagnosis not present

## 2019-09-20 DIAGNOSIS — R269 Unspecified abnormalities of gait and mobility: Secondary | ICD-10-CM | POA: Diagnosis not present

## 2019-09-20 DIAGNOSIS — R531 Weakness: Secondary | ICD-10-CM | POA: Diagnosis not present

## 2019-09-20 DIAGNOSIS — R262 Difficulty in walking, not elsewhere classified: Secondary | ICD-10-CM | POA: Diagnosis not present

## 2019-09-22 ENCOUNTER — Ambulatory Visit (INDEPENDENT_AMBULATORY_CARE_PROVIDER_SITE_OTHER): Payer: Medicare Other | Admitting: Urology

## 2019-09-22 DIAGNOSIS — N3941 Urge incontinence: Secondary | ICD-10-CM | POA: Diagnosis not present

## 2019-09-22 DIAGNOSIS — R339 Retention of urine, unspecified: Secondary | ICD-10-CM

## 2019-09-23 DIAGNOSIS — R262 Difficulty in walking, not elsewhere classified: Secondary | ICD-10-CM | POA: Diagnosis not present

## 2019-09-23 DIAGNOSIS — R269 Unspecified abnormalities of gait and mobility: Secondary | ICD-10-CM | POA: Diagnosis not present

## 2019-09-23 DIAGNOSIS — R531 Weakness: Secondary | ICD-10-CM | POA: Diagnosis not present

## 2019-09-27 DIAGNOSIS — R269 Unspecified abnormalities of gait and mobility: Secondary | ICD-10-CM | POA: Diagnosis not present

## 2019-09-27 DIAGNOSIS — R531 Weakness: Secondary | ICD-10-CM | POA: Diagnosis not present

## 2019-09-27 DIAGNOSIS — R262 Difficulty in walking, not elsewhere classified: Secondary | ICD-10-CM | POA: Diagnosis not present

## 2019-09-30 DIAGNOSIS — R269 Unspecified abnormalities of gait and mobility: Secondary | ICD-10-CM | POA: Diagnosis not present

## 2019-09-30 DIAGNOSIS — R262 Difficulty in walking, not elsewhere classified: Secondary | ICD-10-CM | POA: Diagnosis not present

## 2019-09-30 DIAGNOSIS — R531 Weakness: Secondary | ICD-10-CM | POA: Diagnosis not present

## 2019-10-05 DIAGNOSIS — R531 Weakness: Secondary | ICD-10-CM | POA: Diagnosis not present

## 2019-10-05 DIAGNOSIS — R269 Unspecified abnormalities of gait and mobility: Secondary | ICD-10-CM | POA: Diagnosis not present

## 2019-10-05 DIAGNOSIS — R262 Difficulty in walking, not elsewhere classified: Secondary | ICD-10-CM | POA: Diagnosis not present

## 2019-10-11 DIAGNOSIS — R269 Unspecified abnormalities of gait and mobility: Secondary | ICD-10-CM | POA: Diagnosis not present

## 2019-10-11 DIAGNOSIS — R531 Weakness: Secondary | ICD-10-CM | POA: Diagnosis not present

## 2019-10-11 DIAGNOSIS — R262 Difficulty in walking, not elsewhere classified: Secondary | ICD-10-CM | POA: Diagnosis not present

## 2019-10-14 DIAGNOSIS — R269 Unspecified abnormalities of gait and mobility: Secondary | ICD-10-CM | POA: Diagnosis not present

## 2019-10-14 DIAGNOSIS — R262 Difficulty in walking, not elsewhere classified: Secondary | ICD-10-CM | POA: Diagnosis not present

## 2019-10-14 DIAGNOSIS — R531 Weakness: Secondary | ICD-10-CM | POA: Diagnosis not present

## 2019-10-21 DIAGNOSIS — R262 Difficulty in walking, not elsewhere classified: Secondary | ICD-10-CM | POA: Diagnosis not present

## 2019-10-21 DIAGNOSIS — R531 Weakness: Secondary | ICD-10-CM | POA: Diagnosis not present

## 2019-10-21 DIAGNOSIS — R269 Unspecified abnormalities of gait and mobility: Secondary | ICD-10-CM | POA: Diagnosis not present

## 2019-10-22 ENCOUNTER — Other Ambulatory Visit: Payer: Self-pay

## 2019-10-22 ENCOUNTER — Ambulatory Visit (INDEPENDENT_AMBULATORY_CARE_PROVIDER_SITE_OTHER): Payer: Medicare Other | Admitting: Urology

## 2019-10-22 DIAGNOSIS — R339 Retention of urine, unspecified: Secondary | ICD-10-CM | POA: Diagnosis not present

## 2019-10-25 DIAGNOSIS — R531 Weakness: Secondary | ICD-10-CM | POA: Diagnosis not present

## 2019-10-25 DIAGNOSIS — R269 Unspecified abnormalities of gait and mobility: Secondary | ICD-10-CM | POA: Diagnosis not present

## 2019-10-25 DIAGNOSIS — R262 Difficulty in walking, not elsewhere classified: Secondary | ICD-10-CM | POA: Diagnosis not present

## 2019-10-28 DIAGNOSIS — R531 Weakness: Secondary | ICD-10-CM | POA: Diagnosis not present

## 2019-10-28 DIAGNOSIS — R262 Difficulty in walking, not elsewhere classified: Secondary | ICD-10-CM | POA: Diagnosis not present

## 2019-10-28 DIAGNOSIS — R269 Unspecified abnormalities of gait and mobility: Secondary | ICD-10-CM | POA: Diagnosis not present

## 2019-11-01 DIAGNOSIS — R269 Unspecified abnormalities of gait and mobility: Secondary | ICD-10-CM | POA: Diagnosis not present

## 2019-11-01 DIAGNOSIS — R531 Weakness: Secondary | ICD-10-CM | POA: Diagnosis not present

## 2019-11-01 DIAGNOSIS — R262 Difficulty in walking, not elsewhere classified: Secondary | ICD-10-CM | POA: Diagnosis not present

## 2019-11-03 DIAGNOSIS — E7800 Pure hypercholesterolemia, unspecified: Secondary | ICD-10-CM | POA: Diagnosis not present

## 2019-11-03 DIAGNOSIS — I10 Essential (primary) hypertension: Secondary | ICD-10-CM | POA: Diagnosis not present

## 2019-11-03 DIAGNOSIS — I639 Cerebral infarction, unspecified: Secondary | ICD-10-CM | POA: Diagnosis not present

## 2019-11-08 DIAGNOSIS — R531 Weakness: Secondary | ICD-10-CM | POA: Diagnosis not present

## 2019-11-08 DIAGNOSIS — R262 Difficulty in walking, not elsewhere classified: Secondary | ICD-10-CM | POA: Diagnosis not present

## 2019-11-08 DIAGNOSIS — R269 Unspecified abnormalities of gait and mobility: Secondary | ICD-10-CM | POA: Diagnosis not present

## 2019-11-11 DIAGNOSIS — R262 Difficulty in walking, not elsewhere classified: Secondary | ICD-10-CM | POA: Diagnosis not present

## 2019-11-11 DIAGNOSIS — R531 Weakness: Secondary | ICD-10-CM | POA: Diagnosis not present

## 2019-11-11 DIAGNOSIS — R269 Unspecified abnormalities of gait and mobility: Secondary | ICD-10-CM | POA: Diagnosis not present

## 2019-11-15 DIAGNOSIS — R262 Difficulty in walking, not elsewhere classified: Secondary | ICD-10-CM | POA: Diagnosis not present

## 2019-11-15 DIAGNOSIS — R531 Weakness: Secondary | ICD-10-CM | POA: Diagnosis not present

## 2019-11-15 DIAGNOSIS — R269 Unspecified abnormalities of gait and mobility: Secondary | ICD-10-CM | POA: Diagnosis not present

## 2019-11-23 DIAGNOSIS — Z713 Dietary counseling and surveillance: Secondary | ICD-10-CM | POA: Diagnosis not present

## 2019-11-23 DIAGNOSIS — I10 Essential (primary) hypertension: Secondary | ICD-10-CM | POA: Diagnosis not present

## 2019-11-23 DIAGNOSIS — N390 Urinary tract infection, site not specified: Secondary | ICD-10-CM | POA: Diagnosis not present

## 2019-11-23 DIAGNOSIS — R410 Disorientation, unspecified: Secondary | ICD-10-CM | POA: Diagnosis not present

## 2019-11-23 DIAGNOSIS — Z789 Other specified health status: Secondary | ICD-10-CM | POA: Diagnosis not present

## 2019-11-23 DIAGNOSIS — Z6824 Body mass index (BMI) 24.0-24.9, adult: Secondary | ICD-10-CM | POA: Diagnosis not present

## 2019-11-23 DIAGNOSIS — Z299 Encounter for prophylactic measures, unspecified: Secondary | ICD-10-CM | POA: Diagnosis not present

## 2019-11-30 ENCOUNTER — Other Ambulatory Visit: Payer: Self-pay

## 2019-11-30 ENCOUNTER — Ambulatory Visit (INDEPENDENT_AMBULATORY_CARE_PROVIDER_SITE_OTHER): Payer: Medicare Other

## 2019-11-30 VITALS — Temp 97.3°F

## 2019-11-30 DIAGNOSIS — R339 Retention of urine, unspecified: Secondary | ICD-10-CM | POA: Diagnosis not present

## 2019-11-30 NOTE — Addendum Note (Signed)
Addended by: Gustavus Messing on: 11/30/2019 01:15 PM   Modules accepted: Level of Service

## 2019-11-30 NOTE — Progress Notes (Addendum)
Suprapubic Cath Change  Patient is present today for a suprapubic catheter change due to urinary retention.  10 ml of water was drained from the balloon, a 16 FR foley cath was removed from the tract with out difficulty.  Site was cleaned and prepped in a sterile fashion with betadine.  A 16 FR foley cath was replaced into the tract no complications were noted. Urine return was noted, 10 ml of sterile water was inflated into the balloon and a bedside bag was attached for drainage.  Patient tolerated well. A night bag was given to patient and proper instruction was given on how to switch bags.    Preformed by: H. Callaway Hardigree LPN   Follow up: I month cath change

## 2019-12-09 DIAGNOSIS — I639 Cerebral infarction, unspecified: Secondary | ICD-10-CM | POA: Diagnosis not present

## 2019-12-09 DIAGNOSIS — I10 Essential (primary) hypertension: Secondary | ICD-10-CM | POA: Diagnosis not present

## 2019-12-09 DIAGNOSIS — E7800 Pure hypercholesterolemia, unspecified: Secondary | ICD-10-CM | POA: Diagnosis not present

## 2019-12-17 DIAGNOSIS — Z23 Encounter for immunization: Secondary | ICD-10-CM | POA: Diagnosis not present

## 2019-12-27 DIAGNOSIS — I82409 Acute embolism and thrombosis of unspecified deep veins of unspecified lower extremity: Secondary | ICD-10-CM | POA: Diagnosis not present

## 2019-12-27 DIAGNOSIS — Z299 Encounter for prophylactic measures, unspecified: Secondary | ICD-10-CM | POA: Diagnosis not present

## 2019-12-27 DIAGNOSIS — F322 Major depressive disorder, single episode, severe without psychotic features: Secondary | ICD-10-CM | POA: Diagnosis not present

## 2019-12-27 DIAGNOSIS — R6883 Chills (without fever): Secondary | ICD-10-CM | POA: Diagnosis not present

## 2019-12-27 DIAGNOSIS — I10 Essential (primary) hypertension: Secondary | ICD-10-CM | POA: Diagnosis not present

## 2019-12-27 DIAGNOSIS — Z6825 Body mass index (BMI) 25.0-25.9, adult: Secondary | ICD-10-CM | POA: Diagnosis not present

## 2020-01-03 DIAGNOSIS — G8191 Hemiplegia, unspecified affecting right dominant side: Secondary | ICD-10-CM | POA: Diagnosis not present

## 2020-01-03 DIAGNOSIS — I82409 Acute embolism and thrombosis of unspecified deep veins of unspecified lower extremity: Secondary | ICD-10-CM | POA: Diagnosis not present

## 2020-01-03 DIAGNOSIS — F322 Major depressive disorder, single episode, severe without psychotic features: Secondary | ICD-10-CM | POA: Diagnosis not present

## 2020-01-03 DIAGNOSIS — Z789 Other specified health status: Secondary | ICD-10-CM | POA: Diagnosis not present

## 2020-01-03 DIAGNOSIS — Z299 Encounter for prophylactic measures, unspecified: Secondary | ICD-10-CM | POA: Diagnosis not present

## 2020-01-03 DIAGNOSIS — N390 Urinary tract infection, site not specified: Secondary | ICD-10-CM | POA: Diagnosis not present

## 2020-01-04 ENCOUNTER — Other Ambulatory Visit: Payer: Self-pay

## 2020-01-04 ENCOUNTER — Other Ambulatory Visit (HOSPITAL_COMMUNITY)
Admission: RE | Admit: 2020-01-04 | Discharge: 2020-01-04 | Disposition: A | Discharge status: Home / Self Care | Payer: Medicare Other | Source: Ambulatory Visit | Attending: Urology | Admitting: Urology

## 2020-01-04 ENCOUNTER — Ambulatory Visit (INDEPENDENT_AMBULATORY_CARE_PROVIDER_SITE_OTHER): Payer: Medicare Other

## 2020-01-04 VITALS — Temp 97.8°F

## 2020-01-04 DIAGNOSIS — R339 Retention of urine, unspecified: Secondary | ICD-10-CM | POA: Diagnosis not present

## 2020-01-04 DIAGNOSIS — N390 Urinary tract infection, site not specified: Secondary | ICD-10-CM | POA: Insufficient documentation

## 2020-01-04 NOTE — Progress Notes (Signed)
Suprapubic Cath Change  Patient is present today for a suprapubic catheter change due to urinary retention.  10 ml of water was drained from the balloon, a 16 FR foley cath was removed from the tract with out difficulty.  Site was cleaned and prepped in a sterile fashion with betadine.  A 16 FR foley cath was replaced into the tract no complications were noted. Urine return was noted, 10 ml of sterile water was inflated into the balloon and a bedside bag  was attached for drainage.  Patient tolerated well.   Preformed by: H. Keary Waterson LPN   Follow up: 1 month cath change   Care taker voiced concerned about pt. having recurrent UTI's and would like pt to see MD. Pt. currently on macrobid for uti. Urine culture obtained and sent to lab. Pt previously treated by primary care doctor for UTI with no culture. Will contact with results and possible OV.

## 2020-01-05 LAB — URINE CULTURE: Culture: <10000 — AB

## 2020-01-06 DIAGNOSIS — M549 Dorsalgia, unspecified: Secondary | ICD-10-CM | POA: Diagnosis not present

## 2020-01-06 DIAGNOSIS — R109 Unspecified abdominal pain: Secondary | ICD-10-CM | POA: Diagnosis not present

## 2020-01-06 DIAGNOSIS — R1110 Vomiting, unspecified: Secondary | ICD-10-CM | POA: Diagnosis not present

## 2020-01-06 DIAGNOSIS — I639 Cerebral infarction, unspecified: Secondary | ICD-10-CM | POA: Diagnosis not present

## 2020-01-06 DIAGNOSIS — Z5321 Procedure and treatment not carried out due to patient leaving prior to being seen by health care provider: Secondary | ICD-10-CM | POA: Diagnosis not present

## 2020-01-06 DIAGNOSIS — E7800 Pure hypercholesterolemia, unspecified: Secondary | ICD-10-CM | POA: Diagnosis not present

## 2020-01-07 ENCOUNTER — Telehealth: Payer: Self-pay

## 2020-01-07 DIAGNOSIS — Z299 Encounter for prophylactic measures, unspecified: Secondary | ICD-10-CM | POA: Diagnosis not present

## 2020-01-07 DIAGNOSIS — N390 Urinary tract infection, site not specified: Secondary | ICD-10-CM | POA: Diagnosis not present

## 2020-01-07 DIAGNOSIS — G8191 Hemiplegia, unspecified affecting right dominant side: Secondary | ICD-10-CM | POA: Diagnosis not present

## 2020-01-07 DIAGNOSIS — I82409 Acute embolism and thrombosis of unspecified deep veins of unspecified lower extremity: Secondary | ICD-10-CM | POA: Diagnosis not present

## 2020-01-07 DIAGNOSIS — R110 Nausea: Secondary | ICD-10-CM | POA: Diagnosis not present

## 2020-01-07 NOTE — Telephone Encounter (Signed)
Daughter called wanting results of urine culture. Results given.

## 2020-01-09 DIAGNOSIS — I10 Essential (primary) hypertension: Secondary | ICD-10-CM | POA: Diagnosis not present

## 2020-01-13 DIAGNOSIS — I739 Peripheral vascular disease, unspecified: Secondary | ICD-10-CM | POA: Diagnosis not present

## 2020-01-13 DIAGNOSIS — M79672 Pain in left foot: Secondary | ICD-10-CM | POA: Diagnosis not present

## 2020-01-13 DIAGNOSIS — B351 Tinea unguium: Secondary | ICD-10-CM | POA: Diagnosis not present

## 2020-01-13 DIAGNOSIS — M79671 Pain in right foot: Secondary | ICD-10-CM | POA: Diagnosis not present

## 2020-01-14 ENCOUNTER — Other Ambulatory Visit: Payer: Self-pay

## 2020-01-14 ENCOUNTER — Encounter: Payer: Self-pay | Admitting: Urology

## 2020-01-14 ENCOUNTER — Ambulatory Visit (INDEPENDENT_AMBULATORY_CARE_PROVIDER_SITE_OTHER): Payer: Medicare Other | Admitting: Urology

## 2020-01-14 VITALS — BP 124/77 | HR 78 | Temp 97.3°F | Ht 68.0 in | Wt 100.0 lb

## 2020-01-14 DIAGNOSIS — N3942 Incontinence without sensory awareness: Secondary | ICD-10-CM | POA: Insufficient documentation

## 2020-01-14 DIAGNOSIS — Z23 Encounter for immunization: Secondary | ICD-10-CM | POA: Diagnosis not present

## 2020-01-14 DIAGNOSIS — N3021 Other chronic cystitis with hematuria: Secondary | ICD-10-CM

## 2020-01-14 MED ORDER — TROSPIUM CHLORIDE 20 MG PO TABS: 20.0000 mg | ORAL_TABLET | Freq: Three times a day (TID) | ORAL | 11 refills | Status: DC

## 2020-01-14 NOTE — Progress Notes (Signed)
Urological Symptom Review  Patient is experiencing the following symptoms: Leakage of urine     Review of Systems  Gastrointestinal (upper)  : Negative for upper GI symptoms  Gastrointestinal (lower) : Negative for lower GI symptoms  Constitutional : Negative for symptoms  Skin: negative  Eyes: Negative for eye symptoms  Ear/Nose/Throat : Negative for Ear/Nose/Throat symptoms  Hematologic/Lymphatic: Negative for Hematologic/Lymphatic symptoms  Cardiovascular : Negative for cardiovascular symptoms  Respiratory : Negative for respiratory symptoms  Endocrine: Negative for endocrine symptoms  Musculoskeletal: Negative for musculoskeletal symptoms  Neurological: Negative for neurological symptoms  Psychologic: Negative for psychiatric symptoms

## 2020-01-14 NOTE — Patient Instructions (Signed)

## 2020-01-14 NOTE — Progress Notes (Signed)
01/14/2020 3:34 PM   Jaclyn Love [birth year] [id number]  Referring provider: Ignatius Specking, MD [address] [place],  [place] [address]  Urinary incontinence  HPI: Ms West is a 12GY here for followup for recurrent UTI and urinary retention. She is having incontinence per urethra at night. No incontinence during the day. She takes trospium 20mg  BID  Her records from [facility] are as follows: 01/15/2017: She was diagnosed with urinary retention which has been managed with an SP tube. She underwent SP tract dilation and placement of a 16 03/17/2017 council 5 weeks ago by Dr. [place]. No hematuria. no pelvic pain.   05/22/17: Patient was seen at [facility] on 7/10 when her daughter brought her in for SP tube change today. Per her daughters catheter fell out sometime during night and her SP tube site had closed. PVR at the time showed 110.9 and she was able to void 125cc without difficulty.  She presents today with complaints of urgency, frequency, and nocturia. She has been having frequency of about every one to 1.5 hours. She feel that she is voiding minimally. She denies any dysuria, gross hematuria, fevers, suprapubic pain, or flank pain.   06/04/2017: Pt is unable to void again today. She has a hx of retention requiring SP tube placement.   07/23/2017: s/p open SP tube placement. She denies any pain. no hematuria   10/29/2017: No hematuria. she feels more irritated around the SP tube site with the 20French in place.   04/29/2018: no issues with her SP tube since last visit. she gets monthly SP tube changes. She has a 16 french which is draining well. No UTIs or hematuria.   10/28/2018: She is having monthly SP changes without issue   09/22/2019: NO UTIs since last visit. She has monthly SP tube changes    PMH: Past Medical History:  Diagnosis Date  . Chronic diastolic heart failure (HCC)   . CKD (chronic kidney disease)   . CVA (cerebral vascular accident) (HCC) 11/2004  . GERD (gastroesophageal reflux  disease)   . Glaucoma   . Hepatic cyst 2017   7 cm hepatic cyst  . Hiatal hernia   . History of deep vein thrombosis (DVT) of lower extremity    One episode; on warfarin since  . Hyperlipidemia   . Hypertension   . Osteoarthritis   . Right hemiplegia (HCC)   . Vitamin D deficiency     Surgical History: Past Surgical History:  Procedure Laterality Date  . CATARACT EXTRACTION Bilateral 11/2009  . CHOLECYSTECTOMY  1985  . CYSTOSCOPY  06/16/2017   Procedure: CYSTOSCOPY;  Surgeon: 08/16/2017, MD;  Location: [facility];  Service: Urology;;  . INSERTION OF SUPRAPUBIC CATHETER N/A 06/16/2017   Procedure: INSERTION OF SUPRAPUBIC CATHETER (22FR 2Way 21ml Balloon);  Surgeon: 30m, MD;  Location: [facility];  Service: Urology;  Laterality: N/A;  . LAPAROSCOPIC LYSIS OF ADHESIONS  2006  . TOTAL ABDOMINAL HYSTERECTOMY  1975    Home Medications:  Allergies as of 01/14/2020      Reactions   Ace Inhibitors Anaphylaxis   Rocephin [ceftriaxone Sodium In Dextrose] Anaphylaxis    Facial swelling - Unsure of penicillins, did get dose of Amoxicillin and currently has swelling   Bentyl [dicyclomine Hcl]    Ulcer drugs- fatigue, hallucination   Fish Allergy Swelling   Phenergan [promethazine Hcl] Other (See Comments)   Cns effects/behavioral   Risperdal [risperidone]    Fatigue, drowsiness   Septra [sulfamethoxazole-trimethoprim] Other (See  Comments)   Intolerance - question of swelling   Vasotec [enalapril Maleate] Swelling   Xanax [alprazolam]    Mood tolerance   Clonidine Derivatives Rash   Rash attributed to glue on the clonidine patch  08/27/16 the patient is on oral Catapres without issue      Medication List       Accurate as of January 14, 2020  3:34 PM. If you have any questions, ask your nurse or doctor.        Abilify 5 MG tablet Generic drug: ARIPiprazole Take 2.5 mg by mouth daily.   allopurinol 100 MG tablet Commonly known as: ZYLOPRIM Take 100 mg by mouth  daily.   amLODipine 10 MG tablet Commonly known as: NORVASC Take 10 mg by mouth daily.   buPROPion 150 MG 24 hr tablet Commonly known as: Wellbutrin XL Take 1 tablet (150 mg total) by mouth daily.   Calcium 500 MG tablet Take one tablet by mouth twice daily   carvedilol 12.5 MG tablet Commonly known as: COREG Take 1 tablet (12.5 mg total) by mouth 2 (two) times daily with a meal.   cloNIDine 0.2 MG tablet Commonly known as: CATAPRES Take 1 tablet (0.2 mg total) by mouth 2 (two) times daily.   dipyridamole-aspirin 200-25 MG 12hr capsule Commonly known as: AGGRENOX Take 1 capsule by mouth daily.   dorzolamide-timolol 22.3-6.8 MG/ML ophthalmic solution Commonly known as: COSOPT Place 1 drop into the left eye daily.   ferrous sulfate 325 (65 FE) MG tablet Take 1 tablet (325 mg total) by mouth daily with breakfast.   furosemide 40 MG tablet Commonly known as: LASIX Take 1 tablet (40 mg total) by mouth daily.   furosemide 20 MG tablet Commonly known as: Lasix Take 1 tablet (20 mg total) by mouth daily with lunch.   latanoprost 0.005 % ophthalmic solution Commonly known as: XALATAN Place 1 drop into the left eye at bedtime.   levothyroxine 25 MCG tablet Commonly known as: SYNTHROID Take 25 mcg by mouth daily before breakfast.   methocarbamol 500 MG tablet Commonly known as: ROBAXIN Take 500 mg by mouth 2 (two) times daily.   minoxidil 2.5 MG tablet Commonly known as: LONITEN Take 1 tablet (2.5 mg total) by mouth daily.   multivitamin tablet Take 1 tablet by mouth daily.   potassium chloride SA 20 MEQ tablet Commonly known as: KLOR-CON Take 20 mEq by mouth daily.   simvastatin 20 MG tablet Commonly known as: ZOCOR Take 1 tablet (20 mg total) by mouth at bedtime.   telmisartan 80 MG tablet Commonly known as: MICARDIS Take 80 mg by mouth daily.   Vitamin D (Ergocalciferol) 1.25 MG (50000 UNIT) Caps capsule Commonly known as: DRISDOL Take 50,000 Units by  mouth every 7 (seven) days.   warfarin 5 MG tablet Commonly known as: COUMADIN Take 1 tablet (5 mg total) by mouth daily. What changed:   how much to take  when to take this  additional instructions       Allergies:  Allergies  Allergen Reactions  . Ace Inhibitors Anaphylaxis  . Rocephin [Ceftriaxone Sodium In Dextrose] Anaphylaxis     Facial swelling - Unsure of penicillins, did get dose of Amoxicillin and currently has swelling  . Bentyl [Dicyclomine Hcl]     Ulcer drugs- fatigue, hallucination  . Fish Allergy Swelling  . Phenergan [Promethazine Hcl] Other (See Comments)    Cns effects/behavioral  . Risperdal [Risperidone]     Fatigue, drowsiness  . Septra [Sulfamethoxazole-Trimethoprim]  Other (See Comments)    Intolerance - question of swelling  . Vasotec [Enalapril Maleate] Swelling  . Xanax [Alprazolam]     Mood tolerance  . Clonidine Derivatives Rash    Rash attributed to glue on the clonidine patch  08/27/16 the patient is on oral Catapres without issue    Family History: Family History  Problem Relation Age of Onset  . Dementia Mother   . Dementia Sister   . Cancer Neg Hx   . Heart disease Neg Hx   . Stroke Neg Hx   . Diabetes Neg Hx     Social History:  reports that she has never smoked. She has never used smokeless tobacco. She reports that she does not drink alcohol or use drugs.  ROS: All other review of systems were reviewed and are negative except what is noted above in HPI  Physical Exam: BP 124/77   Pulse 78   Temp (!) 97.3 F (36.3 C)   Ht 5\' 8"  (1.727 m)   Wt 100 lb (45.4 kg)   BMI 15.20 kg/m   Constitutional:  Alert and oriented, No acute distress. HEENT: [place] AT, moist mucus membranes.  Trachea midline, no masses. Cardiovascular: No clubbing, cyanosis, or edema. Respiratory: Normal respiratory effort, no increased work of breathing. GI: Abdomen is soft, nontender, nondistended, no abdominal masses GU: No CVA tenderness Lymph: No  cervical or inguinal lymphadenopathy. Skin: No rashes, bruises or suspicious lesions. Neurologic: Grossly intact, no focal deficits, moving all 4 extremities. Psychiatric: Normal mood and affect.  Laboratory Data: Lab Results  Component Value Date   WBC 13.7 (H) 06/10/2017   HGB 10.7 (L) 06/10/2017   HCT 30.9 (L) 06/10/2017   MCV 93.1 06/10/2017   PLT 191 06/10/2017    Lab Results  Component Value Date   CREATININE 1.78 (H) 06/10/2017    No results found for: PSA  No results found for: TESTOSTERONE  No results found for: HGBA1C  Urinalysis    Component Value Date/Time   COLORURINE YELLOW 09/07/2016 1207   APPEARANCEUR CLOUDY (A) 09/07/2016 1207   LABSPEC 1.015 09/07/2016 1207   PHURINE 7.0 09/07/2016 1207   GLUCOSEU NEGATIVE 09/07/2016 1207   HGBUR MODERATE (A) 09/07/2016 1207   BILIRUBINUR NEGATIVE 09/07/2016 1207   KETONESUR NEGATIVE 09/07/2016 1207   PROTEINUR 30 (A) 09/07/2016 1207   NITRITE NEGATIVE 09/07/2016 1207   LEUKOCYTESUR LARGE (A) 09/07/2016 1207    Lab Results  Component Value Date   BACTERIA FEW (A) 09/07/2016    Pertinent Imaging:  No results found for this or any previous visit. No results found for this or any previous visit. No results found for this or any previous visit. No results found for this or any previous visit. No results found for this or any previous visit. No results found for this or any previous visit. No results found for this or any previous visit. No results found for this or any previous visit.  Assessment & Plan:    1. Urinary incontinence without sensory awareness Increase tropsium to 20mg  TID  2. Chronic cystitis with hematuria Observation. I instructed the patient not to have her urine sent for culture unless she is having worsening urinary symtpoms/hematuria   Return in about 6 months (around 07/16/2020).  , MD  [facility] [place]

## 2020-01-26 DIAGNOSIS — B351 Tinea unguium: Secondary | ICD-10-CM | POA: Diagnosis not present

## 2020-01-26 DIAGNOSIS — L110 Acquired keratosis follicularis: Secondary | ICD-10-CM | POA: Diagnosis not present

## 2020-01-26 DIAGNOSIS — M79672 Pain in left foot: Secondary | ICD-10-CM | POA: Diagnosis not present

## 2020-01-26 DIAGNOSIS — I739 Peripheral vascular disease, unspecified: Secondary | ICD-10-CM | POA: Diagnosis not present

## 2020-01-26 DIAGNOSIS — M79671 Pain in right foot: Secondary | ICD-10-CM | POA: Diagnosis not present

## 2020-01-27 DIAGNOSIS — R109 Unspecified abdominal pain: Secondary | ICD-10-CM | POA: Diagnosis not present

## 2020-01-27 DIAGNOSIS — I10 Essential (primary) hypertension: Secondary | ICD-10-CM | POA: Diagnosis not present

## 2020-01-27 DIAGNOSIS — G8191 Hemiplegia, unspecified affecting right dominant side: Secondary | ICD-10-CM | POA: Diagnosis not present

## 2020-01-27 DIAGNOSIS — Z299 Encounter for prophylactic measures, unspecified: Secondary | ICD-10-CM | POA: Diagnosis not present

## 2020-01-27 DIAGNOSIS — F322 Major depressive disorder, single episode, severe without psychotic features: Secondary | ICD-10-CM | POA: Diagnosis not present

## 2020-01-31 DIAGNOSIS — E7800 Pure hypercholesterolemia, unspecified: Secondary | ICD-10-CM | POA: Diagnosis not present

## 2020-01-31 DIAGNOSIS — I639 Cerebral infarction, unspecified: Secondary | ICD-10-CM | POA: Diagnosis not present

## 2020-02-01 ENCOUNTER — Ambulatory Visit (INDEPENDENT_AMBULATORY_CARE_PROVIDER_SITE_OTHER): Payer: Medicare Other

## 2020-02-01 ENCOUNTER — Other Ambulatory Visit: Payer: Self-pay

## 2020-02-01 DIAGNOSIS — R339 Retention of urine, unspecified: Secondary | ICD-10-CM | POA: Diagnosis not present

## 2020-02-01 NOTE — Progress Notes (Signed)
Suprapubic Cath Change  Patient is present today for a suprapubic catheter change due to urinary retention.  10 ml of water was drained from the balloon, a 16 FR foley cath was removed from the tract with out difficulty.  Site was cleaned and prepped in a sterile fashion with betadine.  A 16 FR foley cath was replaced into the tract no complications were noted. A small amount of tiny blood clots noticed during the bladder irrigation, no discomfort or distress noted, 10 ml of sterile water was inflated into the balloon and a bedside bag was attached for drainage.  Patient tolerated well.  Preformed by: H. Evelyne Makepeace LPN  Follow up: 1 month cath change

## 2020-02-02 ENCOUNTER — Other Ambulatory Visit: Payer: Self-pay

## 2020-02-02 ENCOUNTER — Telehealth: Payer: Self-pay | Admitting: Urology

## 2020-02-02 DIAGNOSIS — N3942 Incontinence without sensory awareness: Secondary | ICD-10-CM

## 2020-02-02 MED ORDER — TROSPIUM CHLORIDE 20 MG PO TABS: 20.0000 mg | ORAL_TABLET | Freq: Three times a day (TID) | ORAL | 11 refills | Status: DC

## 2020-02-02 NOTE — Telephone Encounter (Signed)
[facility]

## 2020-02-02 NOTE — Telephone Encounter (Signed)
Pts daughter called and asks if medication for bladder spasms could be sent to [facility]. She is almost out of medication, her daughter stresses that they need it sent asap.

## 2020-02-08 DIAGNOSIS — I10 Essential (primary) hypertension: Secondary | ICD-10-CM | POA: Diagnosis not present

## 2020-02-09 DIAGNOSIS — M79672 Pain in left foot: Secondary | ICD-10-CM | POA: Diagnosis not present

## 2020-02-09 DIAGNOSIS — L89893 Pressure ulcer of other site, stage 3: Secondary | ICD-10-CM | POA: Diagnosis not present

## 2020-02-09 DIAGNOSIS — M79671 Pain in right foot: Secondary | ICD-10-CM | POA: Diagnosis not present

## 2020-02-09 DIAGNOSIS — I739 Peripheral vascular disease, unspecified: Secondary | ICD-10-CM | POA: Diagnosis not present

## 2020-02-16 ENCOUNTER — Ambulatory Visit (INDEPENDENT_AMBULATORY_CARE_PROVIDER_SITE_OTHER): Payer: Medicare Other | Admitting: Urology

## 2020-02-16 ENCOUNTER — Encounter: Payer: Self-pay | Admitting: Urology

## 2020-02-16 ENCOUNTER — Other Ambulatory Visit: Payer: Self-pay

## 2020-02-16 VITALS — BP 150/78 | HR 83 | Temp 98.4°F | Ht 67.0 in

## 2020-02-16 DIAGNOSIS — N3942 Incontinence without sensory awareness: Secondary | ICD-10-CM | POA: Diagnosis not present

## 2020-02-16 DIAGNOSIS — R339 Retention of urine, unspecified: Secondary | ICD-10-CM | POA: Diagnosis not present

## 2020-02-16 DIAGNOSIS — N3281 Overactive bladder: Secondary | ICD-10-CM | POA: Diagnosis not present

## 2020-02-16 MED ORDER — TROSPIUM CHLORIDE 20 MG PO TABS: 20.0000 mg | ORAL_TABLET | Freq: Three times a day (TID) | ORAL | 11 refills | Status: AC

## 2020-02-16 MED ORDER — FLUCONAZOLE 150 MG PO TABS: 150.0000 mg | ORAL_TABLET | Freq: Every day | ORAL | 0 refills | Status: AC

## 2020-02-16 NOTE — Progress Notes (Signed)
Urological Symptom Review  Patient is experiencing the following symptoms: Thick white sediment in cath tubing White discharge from vagina (yeast)?    Review of Systems  Gastrointestinal (upper)  : Negative for upper GI symptoms  Gastrointestinal (lower) : Negative for lower GI symptoms  Constitutional : Negative for symptoms  Skin: Negative for skin symptoms  Eyes: Negative for eye symptoms  Ear/Nose/Throat : Negative for Ear/Nose/Throat symptoms  Hematologic/Lymphatic: Negative for Hematologic/Lymphatic symptoms  Cardiovascular : Negative for cardiovascular symptoms  Respiratory : Negative for respiratory symptoms  Endocrine: Negative for endocrine symptoms  Musculoskeletal: Negative for musculoskeletal symptoms  Neurological: Negative for neurological symptoms  Psychologic: Negative for psychiatric symptoms

## 2020-02-16 NOTE — Patient Instructions (Signed)

## 2020-02-16 NOTE — Progress Notes (Signed)
02/16/2020 12:01 PM   Jaclyn Love [birth year] [id number]  Referring provider: Ignatius Specking, MD [address] [place],  [place] [address]  Urinary incontinence  HPI: [name] is a 86yo with a hx of urinary retention managed with SP tube and urinary incontinence. Incontinence resolved after increasing trospium to 60mg  daily. She has increased sediment and which discharge from vagina. No catheter irrigation is being performed currently    PMH: Past Medical History:  Diagnosis Date  . Chronic diastolic heart failure (HCC)   . CKD (chronic kidney disease)   . CVA (cerebral vascular accident) (HCC) 11/2004  . GERD (gastroesophageal reflux disease)   . Glaucoma   . Hepatic cyst 2017   7 cm hepatic cyst  . Hiatal hernia   . History of deep vein thrombosis (DVT) of lower extremity    One episode; on warfarin since  . Hyperlipidemia   . Hypertension   . Osteoarthritis   . Right hemiplegia (HCC)   . Vitamin D deficiency     Surgical History: Past Surgical History:  Procedure Laterality Date  . CATARACT EXTRACTION Bilateral 11/2009  . CHOLECYSTECTOMY  1985  . CYSTOSCOPY  06/16/2017   Procedure: CYSTOSCOPY;  Surgeon: 08/16/2017, MD;  Location: [facility];  Service: Urology;;  . INSERTION OF SUPRAPUBIC CATHETER N/A 06/16/2017   Procedure: INSERTION OF SUPRAPUBIC CATHETER (22FR 2Way 33ml Balloon);  Surgeon: 30m, MD;  Location: [facility];  Service: Urology;  Laterality: N/A;  . LAPAROSCOPIC LYSIS OF ADHESIONS  2006  . TOTAL ABDOMINAL HYSTERECTOMY  1975    Home Medications:  Allergies as of 02/16/2020      Reactions   Ace Inhibitors Anaphylaxis   Rocephin [ceftriaxone Sodium In Dextrose] Anaphylaxis    Facial swelling - Unsure of penicillins, did get dose of Amoxicillin and currently has swelling   Bentyl [dicyclomine Hcl]    Ulcer drugs- fatigue, hallucination   Fish Allergy Swelling   Phenergan [promethazine Hcl] Other (See Comments)   Cns effects/behavioral   Risperdal [risperidone]    Fatigue, drowsiness   Septra [sulfamethoxazole-trimethoprim] Other (See Comments)   Intolerance - question of swelling   Vasotec [enalapril Maleate] Swelling   Xanax [alprazolam]    Mood tolerance   Clonidine Derivatives Rash   Rash attributed to glue on the clonidine patch  08/27/16 the patient is on oral Catapres without issue      Medication List       Accurate as of February 16, 2020 12:01 PM. If you have any questions, ask your nurse or doctor.        Abilify 5 MG tablet Generic drug: ARIPiprazole Take 2.5 mg by mouth daily.   alendronate 70 MG tablet Commonly known as: FOSAMAX Take 70 mg by mouth once a week.   allopurinol 100 MG tablet Commonly known as: ZYLOPRIM Take 100 mg by mouth daily.   amLODipine 10 MG tablet Commonly known as: NORVASC Take 10 mg by mouth daily.   buPROPion 150 MG 24 hr tablet Commonly known as: Wellbutrin XL Take 1 tablet (150 mg total) by mouth daily.   buPROPion 150 MG 12 hr tablet Commonly known as: WELLBUTRIN SR Take 150 mg by mouth 2 (two) times daily.   Calcium 500 MG tablet Take one tablet by mouth twice daily   calcium carbonate 1250 (500 Ca) MG tablet Commonly known as: OS-CAL - dosed in mg of elemental calcium Take one tablet by mouth twice daily   carvedilol 12.5 MG tablet Commonly  known as: COREG Take 1 tablet (12.5 mg total) by mouth 2 (two) times daily with a meal.   cloNIDine 0.2 MG tablet Commonly known as: CATAPRES Take 1 tablet (0.2 mg total) by mouth 2 (two) times daily.   dipyridamole-aspirin 200-25 MG 12hr capsule Commonly known as: AGGRENOX Take 1 capsule by mouth daily.   dorzolamide-timolol 22.3-6.8 MG/ML ophthalmic solution Commonly known as: COSOPT Place 1 drop into the left eye daily.   ferrous gluconate 324 MG tablet Commonly known as: FERGON Take 324 mg by mouth daily.   ferrous sulfate 325 (65 FE) MG tablet Take 1 tablet (325 mg total) by mouth daily with  breakfast.   furosemide 40 MG tablet Commonly known as: LASIX Take 1 tablet (40 mg total) by mouth daily.   furosemide 20 MG tablet Commonly known as: Lasix Take 1 tablet (20 mg total) by mouth daily with lunch.   latanoprost 0.005 % ophthalmic solution Commonly known as: XALATAN Place 1 drop into the left eye at bedtime.   levothyroxine 25 MCG tablet Commonly known as: SYNTHROID Take 25 mcg by mouth daily before breakfast.   methocarbamol 500 MG tablet Commonly known as: ROBAXIN Take 500 mg by mouth 2 (two) times daily.   minoxidil 2.5 MG tablet Commonly known as: LONITEN Take 1 tablet (2.5 mg total) by mouth daily.   mirtazapine 15 MG tablet Commonly known as: REMERON Take 7.5 mg by mouth at bedtime.   multivitamin tablet Take 1 tablet by mouth daily.   ondansetron 4 MG tablet Commonly known as: ZOFRAN Take 4 mg by mouth every 6 (six) hours as needed.   pantoprazole 40 MG tablet Commonly known as: PROTONIX Take 40 mg by mouth daily.   potassium chloride SA 20 MEQ tablet Commonly known as: KLOR-CON Take 20 mEq by mouth daily.   simvastatin 20 MG tablet Commonly known as: ZOCOR Take 1 tablet (20 mg total) by mouth at bedtime.   SSD 1 % cream Generic drug: silver sulfADIAZINE APPLY TO THE AFFECTED AREA(S) EVERY DAY   telmisartan 80 MG tablet Commonly known as: MICARDIS Take 80 mg by mouth daily.   trospium 20 MG tablet Commonly known as: SANCTURA Take 1 tablet (20 mg total) by mouth in the morning, at noon, and at bedtime.   Vitamin D (Ergocalciferol) 1.25 MG (50000 UNIT) Caps capsule Commonly known as: DRISDOL Take 50,000 Units by mouth every 7 (seven) days.   warfarin 5 MG tablet Commonly known as: COUMADIN Take 1 tablet (5 mg total) by mouth daily. What changed:   how much to take  when to take this  additional instructions       Allergies:  Allergies  Allergen Reactions  . Ace Inhibitors Anaphylaxis  . Rocephin [Ceftriaxone Sodium  In Dextrose] Anaphylaxis     Facial swelling - Unsure of penicillins, did get dose of Amoxicillin and currently has swelling  . Bentyl [Dicyclomine Hcl]     Ulcer drugs- fatigue, hallucination  . Fish Allergy Swelling  . Phenergan [Promethazine Hcl] Other (See Comments)    Cns effects/behavioral  . Risperdal [Risperidone]     Fatigue, drowsiness  . Septra [Sulfamethoxazole-Trimethoprim] Other (See Comments)    Intolerance - question of swelling  . Vasotec [Enalapril Maleate] Swelling  . Xanax [Alprazolam]     Mood tolerance  . Clonidine Derivatives Rash    Rash attributed to glue on the clonidine patch  08/27/16 the patient is on oral Catapres without issue    Family History: Family History  Problem Relation Age of Onset  . Dementia Mother   . Dementia Sister   . Cancer Neg Hx   . Heart disease Neg Hx   . Stroke Neg Hx   . Diabetes Neg Hx     Social History:  reports that she has never smoked. She has never used smokeless tobacco. She reports that she does not drink alcohol or use drugs.  ROS: All other review of systems were reviewed and are negative except what is noted above in HPI  Physical Exam: BP (!) 150/78   Pulse 83   Temp 98.4 F (36.9 C)   Ht 5\' 7"  (1.702 m)   BMI 15.66 kg/m   Constitutional:  Alert and oriented, No acute distress. HEENT: [place] AT, moist mucus membranes.  Trachea midline, no masses. Cardiovascular: No clubbing, cyanosis, or edema. Respiratory: Normal respiratory effort, no increased work of breathing. GI: Abdomen is soft, nontender, nondistended, no abdominal masses GU: No CVA tenderness.  Lymph: No cervical or inguinal lymphadenopathy. Skin: No rashes, bruises or suspicious lesions. Neurologic: Grossly intact, no focal deficits, moving all 4 extremities. Psychiatric: Normal mood and affect.  Laboratory Data: Lab Results  Component Value Date   WBC 13.7 (H) 06/10/2017   HGB 10.7 (L) 06/10/2017   HCT 30.9 (L) 06/10/2017   MCV 93.1  06/10/2017   PLT 191 06/10/2017    Lab Results  Component Value Date   CREATININE 1.78 (H) 06/10/2017    No results found for: PSA  No results found for: TESTOSTERONE  No results found for: HGBA1C  Urinalysis    Component Value Date/Time   COLORURINE YELLOW 09/07/2016 1207   APPEARANCEUR CLOUDY (A) 09/07/2016 1207   LABSPEC 1.015 09/07/2016 1207   PHURINE 7.0 09/07/2016 1207   GLUCOSEU NEGATIVE 09/07/2016 1207   HGBUR MODERATE (A) 09/07/2016 1207   BILIRUBINUR NEGATIVE 09/07/2016 1207   KETONESUR NEGATIVE 09/07/2016 1207   PROTEINUR 30 (A) 09/07/2016 1207   NITRITE NEGATIVE 09/07/2016 1207   LEUKOCYTESUR LARGE (A) 09/07/2016 1207    Lab Results  Component Value Date   BACTERIA FEW (A) 09/07/2016    Pertinent Imaging:  No results found for this or any previous visit. No results found for this or any previous visit. No results found for this or any previous visit. No results found for this or any previous visit. No results found for this or any previous visit. No results found for this or any previous visit. No results found for this or any previous visit. No results found for this or any previous visit.  Assessment & Plan:    1. Urinary retention -continue monthly SP tube changes -flush SP tube with 60cc tap water daily  2. OAB (overactive bladder) -trospium 60mg  daily   No follow-ups on file.  09/09/2016, MD  [facility] [place]

## 2020-02-23 DIAGNOSIS — M79671 Pain in right foot: Secondary | ICD-10-CM | POA: Diagnosis not present

## 2020-02-23 DIAGNOSIS — L89893 Pressure ulcer of other site, stage 3: Secondary | ICD-10-CM | POA: Diagnosis not present

## 2020-02-23 DIAGNOSIS — M79672 Pain in left foot: Secondary | ICD-10-CM | POA: Diagnosis not present

## 2020-02-23 DIAGNOSIS — I739 Peripheral vascular disease, unspecified: Secondary | ICD-10-CM | POA: Diagnosis not present

## 2020-02-24 DIAGNOSIS — I10 Essential (primary) hypertension: Secondary | ICD-10-CM | POA: Diagnosis not present

## 2020-02-24 DIAGNOSIS — Z299 Encounter for prophylactic measures, unspecified: Secondary | ICD-10-CM | POA: Diagnosis not present

## 2020-02-24 DIAGNOSIS — R5383 Other fatigue: Secondary | ICD-10-CM | POA: Diagnosis not present

## 2020-02-24 DIAGNOSIS — E440 Moderate protein-calorie malnutrition: Secondary | ICD-10-CM | POA: Diagnosis not present

## 2020-03-03 ENCOUNTER — Ambulatory Visit (INDEPENDENT_AMBULATORY_CARE_PROVIDER_SITE_OTHER): Payer: Medicare Other

## 2020-03-03 ENCOUNTER — Other Ambulatory Visit: Payer: Self-pay

## 2020-03-03 DIAGNOSIS — N390 Urinary tract infection, site not specified: Secondary | ICD-10-CM

## 2020-03-03 MED ORDER — STERILE WATER FOR IRRIGATION IR SOLN: 60.0000 mL | Freq: Every day | 11 refills | Status: AC

## 2020-03-03 NOTE — Progress Notes (Signed)
Suprapubic Cath Change  Patient is present today for a suprapubic catheter change due to urinary retention.  70ml of water was drained from the balloon, a 16FR foley cath was removed from the tract with out difficulty.  Site was cleaned and prepped in a sterile fashion with betadine.  A 16FR foley cath was replaced into the tract no complications were noted. Urine return was noted, 10 ml of sterile water was inflated into the balloon and a bedside bag was attached for drainage.  Patient tolerated well. A night bag was given to patient and proper instruction was given on how to switch bags.    Preformed by: Grenda Lora LPN  Follow up: 1 month cath change   Rx. sent in for sterile water flush.

## 2020-03-03 NOTE — Addendum Note (Signed)
Addended by: Gustavus Messing on: 03/03/2020 12:16 PM   Modules accepted: Level of Service

## 2020-03-10 DIAGNOSIS — I10 Essential (primary) hypertension: Secondary | ICD-10-CM | POA: Diagnosis not present

## 2020-03-15 DIAGNOSIS — L89892 Pressure ulcer of other site, stage 2: Secondary | ICD-10-CM | POA: Diagnosis not present

## 2020-03-15 DIAGNOSIS — I739 Peripheral vascular disease, unspecified: Secondary | ICD-10-CM | POA: Diagnosis not present

## 2020-03-30 DIAGNOSIS — M79672 Pain in left foot: Secondary | ICD-10-CM | POA: Diagnosis not present

## 2020-03-30 DIAGNOSIS — M79671 Pain in right foot: Secondary | ICD-10-CM | POA: Diagnosis not present

## 2020-03-30 DIAGNOSIS — I739 Peripheral vascular disease, unspecified: Secondary | ICD-10-CM | POA: Diagnosis not present

## 2020-03-30 DIAGNOSIS — L110 Acquired keratosis follicularis: Secondary | ICD-10-CM | POA: Diagnosis not present

## 2020-04-03 ENCOUNTER — Other Ambulatory Visit: Payer: Self-pay

## 2020-04-03 ENCOUNTER — Ambulatory Visit (INDEPENDENT_AMBULATORY_CARE_PROVIDER_SITE_OTHER): Payer: Medicare Other

## 2020-04-03 DIAGNOSIS — R339 Retention of urine, unspecified: Secondary | ICD-10-CM

## 2020-04-03 NOTE — Progress Notes (Signed)
Suprapubic Cath Change  Patient is present today for a suprapubic catheter change due to urinary retention.  71ml of water was drained from the balloon, a 16FR foley cath was removed from the tract with out difficulty.  Site was cleaned and prepped in a sterile fashion with betadine.  A 16FR foley cath was replaced into the tract no complications were noted. Urine return was noted, 10 ml of sterile water was inflated into the balloon and a bedside bag was attached for drainage.  Patient tolerated well.  Performed by: Kaydense Rizo LPN  Follow up: I month cath change

## 2020-04-05 DIAGNOSIS — H401112 Primary open-angle glaucoma, right eye, moderate stage: Secondary | ICD-10-CM | POA: Diagnosis not present

## 2020-04-05 DIAGNOSIS — H532 Diplopia: Secondary | ICD-10-CM | POA: Diagnosis not present

## 2020-04-05 DIAGNOSIS — H401121 Primary open-angle glaucoma, left eye, mild stage: Secondary | ICD-10-CM | POA: Diagnosis not present

## 2020-04-09 DIAGNOSIS — I639 Cerebral infarction, unspecified: Secondary | ICD-10-CM | POA: Diagnosis not present

## 2020-04-09 DIAGNOSIS — E7800 Pure hypercholesterolemia, unspecified: Secondary | ICD-10-CM | POA: Diagnosis not present

## 2020-04-09 DIAGNOSIS — I10 Essential (primary) hypertension: Secondary | ICD-10-CM | POA: Diagnosis not present

## 2020-04-14 DIAGNOSIS — R410 Disorientation, unspecified: Secondary | ICD-10-CM | POA: Diagnosis not present

## 2020-04-14 DIAGNOSIS — G8191 Hemiplegia, unspecified affecting right dominant side: Secondary | ICD-10-CM | POA: Diagnosis not present

## 2020-04-14 DIAGNOSIS — F0391 Unspecified dementia with behavioral disturbance: Secondary | ICD-10-CM | POA: Diagnosis not present

## 2020-04-14 DIAGNOSIS — I10 Essential (primary) hypertension: Secondary | ICD-10-CM | POA: Diagnosis not present

## 2020-04-14 DIAGNOSIS — Z299 Encounter for prophylactic measures, unspecified: Secondary | ICD-10-CM | POA: Diagnosis not present

## 2020-04-14 DIAGNOSIS — E440 Moderate protein-calorie malnutrition: Secondary | ICD-10-CM | POA: Diagnosis not present

## 2020-05-05 ENCOUNTER — Ambulatory Visit (INDEPENDENT_AMBULATORY_CARE_PROVIDER_SITE_OTHER): Payer: Medicare Other

## 2020-05-05 ENCOUNTER — Other Ambulatory Visit: Payer: Self-pay

## 2020-05-05 DIAGNOSIS — R339 Retention of urine, unspecified: Secondary | ICD-10-CM

## 2020-05-05 NOTE — Progress Notes (Signed)
Suprapubic Cath Change  Patient is present today for a suprapubic catheter change due to urinary retention.  99ml of water was drained from the balloon, a 14FR foley cath was removed from the tract with out difficulty.  Site was cleaned and prepped in a sterile fashion with betadine.  A 14FR foley cath was replaced into the tract no complications were noted. Urine return was noted, 10 ml of sterile water was inflated into the balloon and a bedside bag was attached for drainage.    Preformed by: Sharnese Heath, LPN  Follow up: 4 wk NV sp cath change

## 2020-05-05 NOTE — Patient Instructions (Signed)

## 2020-05-10 DIAGNOSIS — I10 Essential (primary) hypertension: Secondary | ICD-10-CM | POA: Diagnosis not present

## 2020-05-10 DIAGNOSIS — I639 Cerebral infarction, unspecified: Secondary | ICD-10-CM | POA: Diagnosis not present

## 2020-05-10 DIAGNOSIS — E7800 Pure hypercholesterolemia, unspecified: Secondary | ICD-10-CM | POA: Diagnosis not present

## 2020-05-31 DIAGNOSIS — G8191 Hemiplegia, unspecified affecting right dominant side: Secondary | ICD-10-CM | POA: Diagnosis not present

## 2020-05-31 DIAGNOSIS — D696 Thrombocytopenia, unspecified: Secondary | ICD-10-CM | POA: Diagnosis not present

## 2020-05-31 DIAGNOSIS — Z299 Encounter for prophylactic measures, unspecified: Secondary | ICD-10-CM | POA: Diagnosis not present

## 2020-05-31 DIAGNOSIS — F0391 Unspecified dementia with behavioral disturbance: Secondary | ICD-10-CM | POA: Diagnosis not present

## 2020-05-31 DIAGNOSIS — I82409 Acute embolism and thrombosis of unspecified deep veins of unspecified lower extremity: Secondary | ICD-10-CM | POA: Diagnosis not present

## 2020-05-31 DIAGNOSIS — I10 Essential (primary) hypertension: Secondary | ICD-10-CM | POA: Diagnosis not present

## 2020-06-05 ENCOUNTER — Ambulatory Visit (INDEPENDENT_AMBULATORY_CARE_PROVIDER_SITE_OTHER): Payer: Medicare Other | Admitting: Urology

## 2020-06-05 ENCOUNTER — Other Ambulatory Visit: Payer: Self-pay

## 2020-06-05 DIAGNOSIS — N3281 Overactive bladder: Secondary | ICD-10-CM

## 2020-06-05 NOTE — Patient Instructions (Addendum)
Foley Catheter Care and Patient Education  Perform catheter care every day.  You can do this while in the shower, but NOT while taking a tub bath.  You will need the following supplies: -mild soap, such as Dove -water -a clean washcloth (not one already used for bathing) or a 4x4 piece of gauze -1 Cath-Secure -night drainage bag -2 alcohol swabs  1. Was you hands thoroughly with soap and water 2. Using mild soap and water, clan your genital area a. Men should retract the foreskin, if needed, and clean the area, including the penis b. Women should separate the labia, and clean the area from front to back  3. Clean your urinary opening, which is where the catheter enters your body. 4. Clean the catheter from where it enters your body and then down, away from your body.  Hold the catheter at the point it enters your body so that you don't put tension on it. 5. Rinse the area well and dry it gently.  Changing the drainage bag You will change your drainage bag twice a day -in the morning after you shower, change he night bag to the leg bag -at night before you go to bed, change the leg bag to the night bag  1. Wash your hands thoroughly with soap and water 2. Empty the urine from the drainage bag into the toilet before you change it 3. Pinch off the catheter with your fingers and disconnect the used bag 4. Wipe the end of the catheter using an alcohol pad 5. Wipe the connector on the bag using the second alcohol pad 6. Connect the clean bag to the catheter and release your finger pinch 7. Check all connections.  Straighten any kinks or twits in the tubing  Caring for the Leg bag -always wear the leg bag below your knee.  This will help it drain. -keep the leg bag secure with the velcro straps.  If the straps leave a mark on your leg they are to tight and should be loosened.  Leaving the straps too tight can decrease you circulation and lead to blood clots. -empty the leg bag through  the spout at the bottom every 2-4 hours as needed.  Do not let the bag become completely full. -do not lie down for longer than 2 hours while you are wearing the leg bag.  Caring for the Night Bag -always keep the night bag below the level of your bladder . -to hang your night bag while you sleep, place a clean plastic bag inside of a wastebasket.  Hang the night bag on the inside of the wastebasket.  Cleaning the Drainage bag 1. Wash you hands thoroughly with soap and water. 2. Rinse the equipment with cool water.  Do not use hot water it can damage the plastic equipment. 3. Was the equipment with a mild liquid detergent (ivory) and rinse with cool water. 4. To decrease odor, fill the bag halfway with a mixture of 1 part vinegar and 3 parts water. Shake the bag and let it sit for 15 minutes. 5. Rinse the bag with cool water and hang it up to dry.  Preventing infection -keep the drainage bag below the level of your bladder and off the floor at all times. -keep the catheter secured to your thigh to prevent it from moving. -do not lie on or block the flow of urine in the tubing. -shower daily to keep the catheter clean. -clean your hands before and after touching   the catheter or bag. -the spout of the drainage bag should never touch the side of the toilet or any emptying container.  Special Points -You may see some blood or urine around where the catheter enters your body, especially when walking or having a bowel movement.  This is normal, as long as there is urine draining into the drainage bag.  If you experience significant leakage around  catheter tube where it enters your urethra possibly associated with lower abdominal cramping you could be having a bladder spasm.  Please notify your doctor and we can prescribe you a medication to improve your symptoms. -drink 1-2 glasses of liquids every 2 hours while you're awake.  Call your doctor immediately if -your catheter comes out, do not try  to replace it yourself -you have temperature of 101F (38.8C) or higher -you have decrease in the amount of urine you are making -you have foul-smelling urine -you have bright red blood or large blood clots in your urine -you have abdominal pain and no urine in your catheter bag   Botulinum Toxin Bladder Injection  A botulinum toxin bladder injection is a procedure to treat an overactive bladder. During the procedure, a drug called botulinum toxin is injected into the bladder through a long, thin needle. This drug relaxes the bladder muscles and reduces overactivity. You may need this procedure if your medicines are not working or you cannot take them. The procedure may be repeated as needed. The treatment usually lasts for 6 months. Your health care provider will monitor you to see how well you respond. Tell a health care provider about:  Any allergies you have.  All medicines you are taking, including vitamins, herbs, eye drops, creams, and over-the-counter medicines.  Any problems you or family members have had with anesthetic medicines.  Any blood disorders you have.  Any surgeries you have had.  Any medical conditions you have.  Any previous reactions to a botulinum toxin injection.  Any symptoms of urinary tract infection. These include chills, fever, a burning feeling when passing urine, and needing to pass urine often.  Whether you are pregnant or may be pregnant. What are the risks? Generally this is a safe procedure. However, problems may occur, including:  Not being able to pass urine. If this happens, you may need to have your bladder emptied with a thin tube inserted into your urethra (urinary catheter).  Bleeding.  Urinary tract infection.  Allergic reaction to the botulinum toxin.  Pain or burning when passing urine.  Damage to other structures or organs. What happens before the procedure? Staying hydrated Follow instructions from your health care provider  about hydration, which may include:  Up to 2 hours before the procedure - you may continue to drink clear liquids, such as water, clear fruit juice, black coffee, and plain tea. Eating and drinking restrictions Follow instructions from your health care provider about eating and drinking, which may include:  8 hours before the procedure - stop eating heavy meals or foods, such as meat, fried foods, or fatty foods.  6 hours before the procedure - stop eating light meals or foods, such as toast or cereal.  6 hours before the procedure - stop drinking milk or drinks that contain milk.  2 hours before the procedure - stop drinking clear liquids. Medicines Ask your health care provider about:  Changing or stopping your regular medicines. This is especially important if you are taking diabetes medicines or blood thinners.  Taking medicines such as  aspirin and ibuprofen. These medicines can thin your blood. Do not take these medicines unless your health care provider tells you to take them.  Taking over-the-counter medicines, vitamins, herbs, and supplements. General instructions  Plan to have someone take you home from the hospital or clinic.  If you will be going home right after the procedure, plan to have someone with you for 24 hours.  Ask your health care provider what steps will be taken to help prevent infection. These may include: ? Removing hair at the procedure site. ? Washing skin with a germ-killing soap. ? Antibiotic medicine. What happens during the procedure?   You will be asked to empty your bladder.  An IV will be inserted into one of your veins.  You will be given one or more of the following: ? A medicine to help you relax (sedative). ? A medicine to numb the area (local anesthetic). ? A medicine to make you fall asleep (general anesthetic).  A long, thin scope called a cystoscope will be passed into your bladder through the part of the body that carries urine  from your bladder (urethra).  The cystoscope will be used to fill your bladder with water.  A long needle will be passed through the cystoscope and into the bladder.  The botulinum toxin will be injected into your bladder. It may be injected into multiple areas of your bladder.  Your bladder will be emptied, and the cystoscope will be removed. The procedure may vary among health care providers and hospitals. What can I expect after procedure? After your procedure, it is common to have:  Blood-tinged urine.  Burning or soreness when you pass urine. Follow these instructions at home: Medicines  Take over-the-counter and prescription medicines only as told by your health care provider.  If you were prescribed an antibiotic medicine, take it as told by your health care provider. Do not stop taking the antibiotic even if you start to feel better. General instructions   Do not drive for 24 hours if you were given a sedative during your procedure.  Drink enough fluid to keep your urine pale yellow.  Return to your normal activities as told by your health care provider. Ask your health care provider what activities are safe for you.  Keep all follow-up visits as told by your health care provider. This is important. Contact a health care provider if you have:  A fever or chills.  Blood-tinged urine for more than one day after your procedure.  Worsening pain or burning when you pass urine.  Pain or burning when passing urine for more than two days after your procedure.  Trouble emptying your bladder. Get help right away if you:  Have bright red blood in your urine.  Are unable to pass urine. Summary  A botulinum toxin bladder injection is a procedure to treat an overactive bladder.  This is generally a very safe procedure. However, problems may occur, including not being able to pass urine, bleeding, infection, pain, and allergic reactions to medicines.  You will be told  when to stop eating and drinking, and what medicines to change or stop. Follow instructions carefully.  After the procedure, it is common to have blood in urine and to have soreness or burning when passing urine.  Contact a health care provider if you have a fever, have blood in urine for more than a few days, or have trouble passing urine. Get help right away if you have bright red blood in  the urine, or if you are unable to pass urine. This information is not intended to replace advice given to you by your health care provider. Make sure you discuss any questions you have with your health care provider. Document Revised: 05/08/2018 Document Reviewed: 05/08/2018 Elsevier Patient Education  2020 [employer].

## 2020-06-05 NOTE — Progress Notes (Signed)
06/05/2020 11:47 AM   Jaclyn Love 1931-09-28 [id number]  Referring provider: Ignatius Specking, MD [address] [place],  [place] [address]  Urinary incontinence  HPI: [name] is an 86yo here for followup for OAB and chronic SP tube change. The SP tube continues to drain well but she has daily urinary urgency with associated urge incontinence per urethra. She is on tropsium and mirabegron which has improved the incontinent episodes but it has failed to improve the incontinence to the patients satisfaction. No UTI. No hematuria. No pelvic pain   PMH: Past Medical History:  Diagnosis Date  . Chronic diastolic heart failure (HCC)   . CKD (chronic kidney disease)   . CVA (cerebral vascular accident) (HCC) 11/2004  . GERD (gastroesophageal reflux disease)   . Glaucoma   . Hepatic cyst 2017   7 cm hepatic cyst  . Hiatal hernia   . History of deep vein thrombosis (DVT) of lower extremity    One episode; on warfarin since  . Hyperlipidemia   . Hypertension   . Osteoarthritis   . Right hemiplegia (HCC)   . Vitamin D deficiency     Surgical History: Past Surgical History:  Procedure Laterality Date  . CATARACT EXTRACTION Bilateral 11/2009  . CHOLECYSTECTOMY  1985  . CYSTOSCOPY  06/16/2017   Procedure: CYSTOSCOPY;  Surgeon: Malen Gauze, MD;  Location: [facility];  Service: Urology;;  . INSERTION OF SUPRAPUBIC CATHETER N/A 06/16/2017   Procedure: INSERTION OF SUPRAPUBIC CATHETER (22FR 2Way 30ml Balloon);  Surgeon: Malen Gauze, MD;  Location: [facility];  Service: Urology;  Laterality: N/A;  . LAPAROSCOPIC LYSIS OF ADHESIONS  2006  . TOTAL ABDOMINAL HYSTERECTOMY  1975    Home Medications:  Allergies as of 06/05/2020      Reactions   Ace Inhibitors Anaphylaxis   Rocephin [ceftriaxone Sodium In Dextrose] Anaphylaxis    Facial swelling - Unsure of penicillins, did get dose of Amoxicillin and currently has swelling   Bentyl [dicyclomine Hcl]    Ulcer drugs- fatigue, hallucination    Fish Allergy Swelling   Phenergan [promethazine Hcl] Other (See Comments)   Cns effects/behavioral   Risperdal [risperidone]    Fatigue, drowsiness   Septra [sulfamethoxazole-trimethoprim] Other (See Comments)   Intolerance - question of swelling   Vasotec [enalapril Maleate] Swelling   Xanax [alprazolam]    Mood tolerance   Clonidine Derivatives Rash   Rash attributed to glue on the clonidine patch  08/27/16 the patient is on oral Catapres without issue      Medication List       Accurate as of June 05, 2020 11:47 AM. If you have any questions, ask your nurse or doctor.        Abilify 5 MG tablet Generic drug: ARIPiprazole Take 2.5 mg by mouth daily.   alendronate 70 MG tablet Commonly known as: FOSAMAX Take 70 mg by mouth once a week.   allopurinol 100 MG tablet Commonly known as: ZYLOPRIM Take 100 mg by mouth daily.   amLODipine 10 MG tablet Commonly known as: NORVASC Take 10 mg by mouth daily.   buPROPion 150 MG 24 hr tablet Commonly known as: Wellbutrin XL Take 1 tablet (150 mg total) by mouth daily.   buPROPion 150 MG 12 hr tablet Commonly known as: WELLBUTRIN SR Take 150 mg by mouth 2 (two) times daily.   Calcium 500 MG tablet Take one tablet by mouth twice daily   calcium carbonate 1250 (500 Ca) MG tablet Commonly known  as: OS-CAL - dosed in mg of elemental calcium Take one tablet by mouth twice daily   carvedilol 12.5 MG tablet Commonly known as: COREG Take 1 tablet (12.5 mg total) by mouth 2 (two) times daily with a meal.   cloNIDine 0.2 MG tablet Commonly known as: CATAPRES Take 1 tablet (0.2 mg total) by mouth 2 (two) times daily.   dipyridamole-aspirin 200-25 MG 12hr capsule Commonly known as: AGGRENOX Take 1 capsule by mouth daily.   dorzolamide-timolol 22.3-6.8 MG/ML ophthalmic solution Commonly known as: COSOPT Place 1 drop into the left eye daily.   ferrous gluconate 324 MG tablet Commonly known as: FERGON Take 324 mg by mouth  daily.   ferrous sulfate 325 (65 FE) MG tablet Take 1 tablet (325 mg total) by mouth daily with breakfast.   fluconazole 150 MG tablet Commonly known as: DIFLUCAN Take 1 tablet (150 mg total) by mouth daily.   furosemide 40 MG tablet Commonly known as: LASIX Take 1 tablet (40 mg total) by mouth daily.   furosemide 20 MG tablet Commonly known as: Lasix Take 1 tablet (20 mg total) by mouth daily with lunch.   latanoprost 0.005 % ophthalmic solution Commonly known as: XALATAN Place 1 drop into the left eye at bedtime.   levothyroxine 25 MCG tablet Commonly known as: SYNTHROID Take 25 mcg by mouth daily before breakfast.   methocarbamol 500 MG tablet Commonly known as: ROBAXIN Take 500 mg by mouth 2 (two) times daily.   minoxidil 2.5 MG tablet Commonly known as: LONITEN Take 1 tablet (2.5 mg total) by mouth daily.   mirtazapine 15 MG tablet Commonly known as: REMERON Take 7.5 mg by mouth at bedtime.   multivitamin tablet Take 1 tablet by mouth daily.   ondansetron 4 MG tablet Commonly known as: ZOFRAN Take 4 mg by mouth every 6 (six) hours as needed.   pantoprazole 40 MG tablet Commonly known as: PROTONIX Take 40 mg by mouth daily.   potassium chloride SA 20 MEQ tablet Commonly known as: KLOR-CON Take 20 mEq by mouth daily.   simvastatin 20 MG tablet Commonly known as: ZOCOR Take 1 tablet (20 mg total) by mouth at bedtime.   SSD 1 % cream Generic drug: silver sulfADIAZINE APPLY TO THE AFFECTED AREA(S) EVERY DAY   sterile water for irrigation Irrigate with 60 mLs as directed daily.   telmisartan 80 MG tablet Commonly known as: MICARDIS Take 80 mg by mouth daily.   trospium 20 MG tablet Commonly known as: SANCTURA Take 1 tablet (20 mg total) by mouth in the morning, at noon, and at bedtime.   Vitamin D (Ergocalciferol) 1.25 MG (50000 UNIT) Caps capsule Commonly known as: DRISDOL Take 50,000 Units by mouth every 7 (seven) days.   warfarin 5 MG  tablet Commonly known as: COUMADIN Take 1 tablet (5 mg total) by mouth daily. What changed:   how much to take  when to take this  additional instructions       Allergies:  Allergies  Allergen Reactions  . Ace Inhibitors Anaphylaxis  . Rocephin [Ceftriaxone Sodium In Dextrose] Anaphylaxis     Facial swelling - Unsure of penicillins, did get dose of Amoxicillin and currently has swelling  . Bentyl [Dicyclomine Hcl]     Ulcer drugs- fatigue, hallucination  . Fish Allergy Swelling  . Phenergan [Promethazine Hcl] Other (See Comments)    Cns effects/behavioral  . Risperdal [Risperidone]     Fatigue, drowsiness  . Septra [Sulfamethoxazole-Trimethoprim] Other (See Comments)  Intolerance - question of swelling  . Vasotec [Enalapril Maleate] Swelling  . Xanax [Alprazolam]     Mood tolerance  . Clonidine Derivatives Rash    Rash attributed to glue on the clonidine patch  08/27/16 the patient is on oral Catapres without issue    Family History: Family History  Problem Relation Age of Onset  . Dementia Mother   . Dementia Sister   . Cancer Neg Hx   . Heart disease Neg Hx   . Stroke Neg Hx   . Diabetes Neg Hx     Social History:  reports that she has never smoked. She has never used smokeless tobacco. She reports that she does not drink alcohol and does not use drugs.  ROS: All other review of systems were reviewed and are negative except what is noted above in HPI  Physical Exam: There were no vitals taken for this visit.  Constitutional:  Alert and oriented, No acute distress. HEENT: [place] AT, moist mucus membranes.  Trachea midline, no masses. Cardiovascular: No clubbing, cyanosis, or edema. Respiratory: Normal respiratory effort, no increased work of breathing. GI: Abdomen is soft, nontender, nondistended, no abdominal masses GU: No CVA tenderness.  Lymph: No cervical or inguinal lymphadenopathy. Skin: No rashes, bruises or suspicious lesions. Neurologic: Grossly  intact, no focal deficits, moving all 4 extremities. Psychiatric: Normal mood and affect.  Laboratory Data: Lab Results  Component Value Date   WBC 13.7 (H) 06/10/2017   HGB 10.7 (L) 06/10/2017   HCT 30.9 (L) 06/10/2017   MCV 93.1 06/10/2017   PLT 191 06/10/2017    Lab Results  Component Value Date   CREATININE 1.78 (H) 06/10/2017    No results found for: PSA  No results found for: TESTOSTERONE  No results found for: HGBA1C  Urinalysis    Component Value Date/Time   COLORURINE YELLOW 09/07/2016 1207   APPEARANCEUR CLOUDY (A) 09/07/2016 1207   LABSPEC 1.015 09/07/2016 1207   PHURINE 7.0 09/07/2016 1207   GLUCOSEU NEGATIVE 09/07/2016 1207   HGBUR MODERATE (A) 09/07/2016 1207   BILIRUBINUR NEGATIVE 09/07/2016 1207   KETONESUR NEGATIVE 09/07/2016 1207   PROTEINUR 30 (A) 09/07/2016 1207   NITRITE NEGATIVE 09/07/2016 1207   LEUKOCYTESUR LARGE (A) 09/07/2016 1207    Lab Results  Component Value Date   BACTERIA FEW (A) 09/07/2016    Pertinent Imaging:  No results found for this or any previous visit.  No results found for this or any previous visit.  No results found for this or any previous visit.  No results found for this or any previous visit.  No results found for this or any previous visit.  No results found for this or any previous visit.  No results found for this or any previous visit.  No results found for this or any previous visit.   Assessment & Plan:    1.OAB -I discussed the management of the patients incontinence with the patient and her caregiver.  We siscussed PTNS, intravesical botox and Interstim. The patient is not a candidate for PTNS or interstim. The patient will discuss intravesical botox with her family and contact my office to pursue surgery if they family is in agreement with the patient  No follow-ups on file.  Wilkie Aye, MD  [facility] [place]

## 2020-06-05 NOTE — Progress Notes (Signed)
Suprapubic Cath Change  Patient is present today for a suprapubic catheter change due to urinary retention.  70ml of water was drained from the balloon, a 16FR foley cath was removed from the tract with out difficulty.  Site was cleaned and prepped in a sterile fashion with betadine.  A 16FR foley cath was replaced into the tract no complications were noted. Urine return was noted, 10 ml of sterile water was inflated into the balloon and a bedside bag was attached for drainage.  Patient tolerated well. A night bag was given to patient and proper instruction was given on how to switch bags.    Performed by: Cole Klugh, LPN  Follow up: 1 month sp cath change

## 2020-06-06 ENCOUNTER — Encounter: Payer: Self-pay | Admitting: Urology

## 2020-06-08 DIAGNOSIS — D696 Thrombocytopenia, unspecified: Secondary | ICD-10-CM | POA: Diagnosis not present

## 2020-06-08 DIAGNOSIS — Z299 Encounter for prophylactic measures, unspecified: Secondary | ICD-10-CM | POA: Diagnosis not present

## 2020-06-08 DIAGNOSIS — I10 Essential (primary) hypertension: Secondary | ICD-10-CM | POA: Diagnosis not present

## 2020-06-08 DIAGNOSIS — I82409 Acute embolism and thrombosis of unspecified deep veins of unspecified lower extremity: Secondary | ICD-10-CM | POA: Diagnosis not present

## 2020-06-08 DIAGNOSIS — F0391 Unspecified dementia with behavioral disturbance: Secondary | ICD-10-CM | POA: Diagnosis not present

## 2020-06-08 DIAGNOSIS — R109 Unspecified abdominal pain: Secondary | ICD-10-CM | POA: Diagnosis not present

## 2020-06-09 DIAGNOSIS — I639 Cerebral infarction, unspecified: Secondary | ICD-10-CM | POA: Diagnosis not present

## 2020-06-09 DIAGNOSIS — E7800 Pure hypercholesterolemia, unspecified: Secondary | ICD-10-CM | POA: Diagnosis not present

## 2020-06-09 DIAGNOSIS — I10 Essential (primary) hypertension: Secondary | ICD-10-CM | POA: Diagnosis not present

## 2020-06-12 ENCOUNTER — Telehealth: Payer: Self-pay

## 2020-06-12 NOTE — Telephone Encounter (Signed)
The message said freezing..but she does mean botox. The caregiver states that ya'll discussed it.

## 2020-06-12 NOTE — Telephone Encounter (Signed)
Freezing? Does she mean botox?

## 2020-06-13 ENCOUNTER — Telehealth: Payer: Self-pay

## 2020-06-13 NOTE — Telephone Encounter (Signed)
Daughter called stating that pt has decided not to go ahead with surgery.

## 2020-06-14 NOTE — Telephone Encounter (Signed)
Daughter has decided not to go forward  with the surgery.

## 2020-06-21 DIAGNOSIS — I2510 Atherosclerotic heart disease of native coronary artery without angina pectoris: Secondary | ICD-10-CM | POA: Diagnosis not present

## 2020-06-21 DIAGNOSIS — I708 Atherosclerosis of other arteries: Secondary | ICD-10-CM | POA: Diagnosis not present

## 2020-06-21 DIAGNOSIS — K6289 Other specified diseases of anus and rectum: Secondary | ICD-10-CM | POA: Diagnosis not present

## 2020-06-21 DIAGNOSIS — Z9049 Acquired absence of other specified parts of digestive tract: Secondary | ICD-10-CM | POA: Diagnosis not present

## 2020-06-21 DIAGNOSIS — K838 Other specified diseases of biliary tract: Secondary | ICD-10-CM | POA: Diagnosis not present

## 2020-06-21 DIAGNOSIS — N200 Calculus of kidney: Secondary | ICD-10-CM | POA: Diagnosis not present

## 2020-06-21 DIAGNOSIS — I700 Atherosclerosis of aorta: Secondary | ICD-10-CM | POA: Diagnosis not present

## 2020-06-21 DIAGNOSIS — Z90710 Acquired absence of both cervix and uterus: Secondary | ICD-10-CM | POA: Diagnosis not present

## 2020-06-22 ENCOUNTER — Ambulatory Visit (HOSPITAL_COMMUNITY): Admission: RE | Admit: 2020-06-22 | Payer: Medicare Other | Source: Home / Self Care | Admitting: Urology

## 2020-06-22 ENCOUNTER — Encounter (HOSPITAL_COMMUNITY): Admission: RE | Payer: Self-pay | Source: Home / Self Care

## 2020-06-22 DIAGNOSIS — L110 Acquired keratosis follicularis: Secondary | ICD-10-CM | POA: Diagnosis not present

## 2020-06-22 DIAGNOSIS — N3281 Overactive bladder: Secondary | ICD-10-CM

## 2020-06-22 DIAGNOSIS — M79671 Pain in right foot: Secondary | ICD-10-CM | POA: Diagnosis not present

## 2020-06-22 DIAGNOSIS — I739 Peripheral vascular disease, unspecified: Secondary | ICD-10-CM | POA: Diagnosis not present

## 2020-06-22 DIAGNOSIS — M79672 Pain in left foot: Secondary | ICD-10-CM | POA: Diagnosis not present

## 2020-06-22 SURGERY — CYSTOSCOPY, WITH INJECTION OF BLADDER NECK OR BLADDER WALL
Anesthesia: Monitor Anesthesia Care

## 2020-07-03 ENCOUNTER — Ambulatory Visit: Payer: Medicare Other

## 2020-07-04 ENCOUNTER — Ambulatory Visit (INDEPENDENT_AMBULATORY_CARE_PROVIDER_SITE_OTHER): Payer: Medicare Other

## 2020-07-04 ENCOUNTER — Other Ambulatory Visit: Payer: Self-pay

## 2020-07-04 DIAGNOSIS — R339 Retention of urine, unspecified: Secondary | ICD-10-CM | POA: Diagnosis not present

## 2020-07-04 NOTE — Progress Notes (Signed)
Suprapubic Cath Change  Patient is present today for a suprapubic catheter change due to urinary retention.  15ml of water was drained from the balloon, a 16FR foley cath was removed from the tract with out difficulty.  Site was cleaned and prepped in a sterile fashion with betadine.  A 16FR foley cath was replaced into the tract no complications were noted. Urine return was noted, 10 ml of sterile water was inflated into the balloon and a bedside bag  was attached for drainage.  Patient tolerated well.   Performed by:Kimberley Dastrup, LPN  Follow up: Keep next schedule [place]

## 2020-07-05 DIAGNOSIS — G8191 Hemiplegia, unspecified affecting right dominant side: Secondary | ICD-10-CM | POA: Diagnosis not present

## 2020-07-05 DIAGNOSIS — Z299 Encounter for prophylactic measures, unspecified: Secondary | ICD-10-CM | POA: Diagnosis not present

## 2020-07-05 DIAGNOSIS — I10 Essential (primary) hypertension: Secondary | ICD-10-CM | POA: Diagnosis not present

## 2020-07-05 DIAGNOSIS — F0391 Unspecified dementia with behavioral disturbance: Secondary | ICD-10-CM | POA: Diagnosis not present

## 2020-07-05 DIAGNOSIS — I700 Atherosclerosis of aorta: Secondary | ICD-10-CM | POA: Diagnosis not present

## 2020-07-11 DIAGNOSIS — I10 Essential (primary) hypertension: Secondary | ICD-10-CM | POA: Diagnosis not present

## 2020-07-12 DIAGNOSIS — I639 Cerebral infarction, unspecified: Secondary | ICD-10-CM | POA: Diagnosis not present

## 2020-07-12 DIAGNOSIS — R630 Anorexia: Secondary | ICD-10-CM | POA: Diagnosis not present

## 2020-07-12 DIAGNOSIS — Z299 Encounter for prophylactic measures, unspecified: Secondary | ICD-10-CM | POA: Diagnosis not present

## 2020-07-12 DIAGNOSIS — K219 Gastro-esophageal reflux disease without esophagitis: Secondary | ICD-10-CM | POA: Diagnosis not present

## 2020-07-12 DIAGNOSIS — R471 Dysarthria and anarthria: Secondary | ICD-10-CM | POA: Diagnosis not present

## 2020-07-21 DIAGNOSIS — R079 Chest pain, unspecified: Secondary | ICD-10-CM | POA: Diagnosis not present

## 2020-07-21 DIAGNOSIS — R404 Transient alteration of awareness: Secondary | ICD-10-CM | POA: Diagnosis not present

## 2020-07-21 DIAGNOSIS — F0391 Unspecified dementia with behavioral disturbance: Secondary | ICD-10-CM | POA: Diagnosis not present

## 2020-07-21 DIAGNOSIS — Z299 Encounter for prophylactic measures, unspecified: Secondary | ICD-10-CM | POA: Diagnosis not present

## 2020-07-21 DIAGNOSIS — R0789 Other chest pain: Secondary | ICD-10-CM | POA: Diagnosis not present

## 2020-07-27 DIAGNOSIS — E46 Unspecified protein-calorie malnutrition: Secondary | ICD-10-CM | POA: Diagnosis not present

## 2020-07-27 DIAGNOSIS — R531 Weakness: Secondary | ICD-10-CM | POA: Diagnosis not present

## 2020-07-27 DIAGNOSIS — M1990 Unspecified osteoarthritis, unspecified site: Secondary | ICD-10-CM | POA: Diagnosis not present

## 2020-07-27 DIAGNOSIS — R1310 Dysphagia, unspecified: Secondary | ICD-10-CM | POA: Diagnosis not present

## 2020-07-27 DIAGNOSIS — F0391 Unspecified dementia with behavioral disturbance: Secondary | ICD-10-CM | POA: Diagnosis not present

## 2020-07-27 DIAGNOSIS — E039 Hypothyroidism, unspecified: Secondary | ICD-10-CM | POA: Diagnosis not present

## 2020-07-27 DIAGNOSIS — G8191 Hemiplegia, unspecified affecting right dominant side: Secondary | ICD-10-CM | POA: Diagnosis not present

## 2020-07-27 DIAGNOSIS — H409 Unspecified glaucoma: Secondary | ICD-10-CM | POA: Diagnosis not present

## 2020-07-27 DIAGNOSIS — I10 Essential (primary) hypertension: Secondary | ICD-10-CM | POA: Diagnosis not present

## 2020-07-27 DIAGNOSIS — E7800 Pure hypercholesterolemia, unspecified: Secondary | ICD-10-CM | POA: Diagnosis not present

## 2020-07-27 DIAGNOSIS — I639 Cerebral infarction, unspecified: Secondary | ICD-10-CM | POA: Diagnosis not present

## 2020-07-27 DIAGNOSIS — K219 Gastro-esophageal reflux disease without esophagitis: Secondary | ICD-10-CM | POA: Diagnosis not present

## 2020-07-27 DIAGNOSIS — E559 Vitamin D deficiency, unspecified: Secondary | ICD-10-CM | POA: Diagnosis not present

## 2020-07-28 DIAGNOSIS — K219 Gastro-esophageal reflux disease without esophagitis: Secondary | ICD-10-CM | POA: Diagnosis not present

## 2020-07-28 DIAGNOSIS — I639 Cerebral infarction, unspecified: Secondary | ICD-10-CM | POA: Diagnosis not present

## 2020-07-28 DIAGNOSIS — E46 Unspecified protein-calorie malnutrition: Secondary | ICD-10-CM | POA: Diagnosis not present

## 2020-07-28 DIAGNOSIS — M1990 Unspecified osteoarthritis, unspecified site: Secondary | ICD-10-CM | POA: Diagnosis not present

## 2020-07-28 DIAGNOSIS — G8191 Hemiplegia, unspecified affecting right dominant side: Secondary | ICD-10-CM | POA: Diagnosis not present

## 2020-07-28 DIAGNOSIS — E559 Vitamin D deficiency, unspecified: Secondary | ICD-10-CM | POA: Diagnosis not present

## 2020-07-31 ENCOUNTER — Ambulatory Visit: Payer: Medicare Other

## 2020-08-01 ENCOUNTER — Ambulatory Visit: Payer: Medicare Other

## 2020-08-01 DIAGNOSIS — K219 Gastro-esophageal reflux disease without esophagitis: Secondary | ICD-10-CM | POA: Diagnosis not present

## 2020-08-01 DIAGNOSIS — E46 Unspecified protein-calorie malnutrition: Secondary | ICD-10-CM | POA: Diagnosis not present

## 2020-08-01 DIAGNOSIS — M1990 Unspecified osteoarthritis, unspecified site: Secondary | ICD-10-CM | POA: Diagnosis not present

## 2020-08-01 DIAGNOSIS — G8191 Hemiplegia, unspecified affecting right dominant side: Secondary | ICD-10-CM | POA: Diagnosis not present

## 2020-08-01 DIAGNOSIS — E559 Vitamin D deficiency, unspecified: Secondary | ICD-10-CM | POA: Diagnosis not present

## 2020-08-01 DIAGNOSIS — I639 Cerebral infarction, unspecified: Secondary | ICD-10-CM | POA: Diagnosis not present

## 2020-08-02 DIAGNOSIS — K219 Gastro-esophageal reflux disease without esophagitis: Secondary | ICD-10-CM | POA: Diagnosis not present

## 2020-08-02 DIAGNOSIS — E46 Unspecified protein-calorie malnutrition: Secondary | ICD-10-CM | POA: Diagnosis not present

## 2020-08-02 DIAGNOSIS — M1990 Unspecified osteoarthritis, unspecified site: Secondary | ICD-10-CM | POA: Diagnosis not present

## 2020-08-02 DIAGNOSIS — E559 Vitamin D deficiency, unspecified: Secondary | ICD-10-CM | POA: Diagnosis not present

## 2020-08-02 DIAGNOSIS — G8191 Hemiplegia, unspecified affecting right dominant side: Secondary | ICD-10-CM | POA: Diagnosis not present

## 2020-08-02 DIAGNOSIS — I639 Cerebral infarction, unspecified: Secondary | ICD-10-CM | POA: Diagnosis not present

## 2020-08-04 DIAGNOSIS — G8191 Hemiplegia, unspecified affecting right dominant side: Secondary | ICD-10-CM | POA: Diagnosis not present

## 2020-08-04 DIAGNOSIS — K219 Gastro-esophageal reflux disease without esophagitis: Secondary | ICD-10-CM | POA: Diagnosis not present

## 2020-08-04 DIAGNOSIS — I639 Cerebral infarction, unspecified: Secondary | ICD-10-CM | POA: Diagnosis not present

## 2020-08-04 DIAGNOSIS — E46 Unspecified protein-calorie malnutrition: Secondary | ICD-10-CM | POA: Diagnosis not present

## 2020-08-04 DIAGNOSIS — M1990 Unspecified osteoarthritis, unspecified site: Secondary | ICD-10-CM | POA: Diagnosis not present

## 2020-08-04 DIAGNOSIS — E559 Vitamin D deficiency, unspecified: Secondary | ICD-10-CM | POA: Diagnosis not present

## 2020-08-07 DIAGNOSIS — M1990 Unspecified osteoarthritis, unspecified site: Secondary | ICD-10-CM | POA: Diagnosis not present

## 2020-08-07 DIAGNOSIS — G8191 Hemiplegia, unspecified affecting right dominant side: Secondary | ICD-10-CM | POA: Diagnosis not present

## 2020-08-07 DIAGNOSIS — K219 Gastro-esophageal reflux disease without esophagitis: Secondary | ICD-10-CM | POA: Diagnosis not present

## 2020-08-07 DIAGNOSIS — I639 Cerebral infarction, unspecified: Secondary | ICD-10-CM | POA: Diagnosis not present

## 2020-08-07 DIAGNOSIS — E559 Vitamin D deficiency, unspecified: Secondary | ICD-10-CM | POA: Diagnosis not present

## 2020-08-07 DIAGNOSIS — E46 Unspecified protein-calorie malnutrition: Secondary | ICD-10-CM | POA: Diagnosis not present

## 2020-08-08 DIAGNOSIS — E559 Vitamin D deficiency, unspecified: Secondary | ICD-10-CM | POA: Diagnosis not present

## 2020-08-08 DIAGNOSIS — G8191 Hemiplegia, unspecified affecting right dominant side: Secondary | ICD-10-CM | POA: Diagnosis not present

## 2020-08-08 DIAGNOSIS — K219 Gastro-esophageal reflux disease without esophagitis: Secondary | ICD-10-CM | POA: Diagnosis not present

## 2020-08-08 DIAGNOSIS — I639 Cerebral infarction, unspecified: Secondary | ICD-10-CM | POA: Diagnosis not present

## 2020-08-08 DIAGNOSIS — M1990 Unspecified osteoarthritis, unspecified site: Secondary | ICD-10-CM | POA: Diagnosis not present

## 2020-08-08 DIAGNOSIS — E46 Unspecified protein-calorie malnutrition: Secondary | ICD-10-CM | POA: Diagnosis not present

## 2020-08-09 DIAGNOSIS — E559 Vitamin D deficiency, unspecified: Secondary | ICD-10-CM | POA: Diagnosis not present

## 2020-08-09 DIAGNOSIS — M1990 Unspecified osteoarthritis, unspecified site: Secondary | ICD-10-CM | POA: Diagnosis not present

## 2020-08-09 DIAGNOSIS — I639 Cerebral infarction, unspecified: Secondary | ICD-10-CM | POA: Diagnosis not present

## 2020-08-09 DIAGNOSIS — G8191 Hemiplegia, unspecified affecting right dominant side: Secondary | ICD-10-CM | POA: Diagnosis not present

## 2020-08-09 DIAGNOSIS — K219 Gastro-esophageal reflux disease without esophagitis: Secondary | ICD-10-CM | POA: Diagnosis not present

## 2020-08-09 DIAGNOSIS — E46 Unspecified protein-calorie malnutrition: Secondary | ICD-10-CM | POA: Diagnosis not present

## 2020-08-10 DIAGNOSIS — I10 Essential (primary) hypertension: Secondary | ICD-10-CM | POA: Diagnosis not present

## 2020-08-11 DIAGNOSIS — F0391 Unspecified dementia with behavioral disturbance: Secondary | ICD-10-CM | POA: Diagnosis not present

## 2020-08-11 DIAGNOSIS — R531 Weakness: Secondary | ICD-10-CM | POA: Diagnosis not present

## 2020-08-11 DIAGNOSIS — E7800 Pure hypercholesterolemia, unspecified: Secondary | ICD-10-CM | POA: Diagnosis not present

## 2020-08-11 DIAGNOSIS — E559 Vitamin D deficiency, unspecified: Secondary | ICD-10-CM | POA: Diagnosis not present

## 2020-08-11 DIAGNOSIS — R1310 Dysphagia, unspecified: Secondary | ICD-10-CM | POA: Diagnosis not present

## 2020-08-11 DIAGNOSIS — M1990 Unspecified osteoarthritis, unspecified site: Secondary | ICD-10-CM | POA: Diagnosis not present

## 2020-08-11 DIAGNOSIS — K219 Gastro-esophageal reflux disease without esophagitis: Secondary | ICD-10-CM | POA: Diagnosis not present

## 2020-08-11 DIAGNOSIS — G8191 Hemiplegia, unspecified affecting right dominant side: Secondary | ICD-10-CM | POA: Diagnosis not present

## 2020-08-11 DIAGNOSIS — I10 Essential (primary) hypertension: Secondary | ICD-10-CM | POA: Diagnosis not present

## 2020-08-11 DIAGNOSIS — E039 Hypothyroidism, unspecified: Secondary | ICD-10-CM | POA: Diagnosis not present

## 2020-08-11 DIAGNOSIS — H409 Unspecified glaucoma: Secondary | ICD-10-CM | POA: Diagnosis not present

## 2020-08-11 DIAGNOSIS — E46 Unspecified protein-calorie malnutrition: Secondary | ICD-10-CM | POA: Diagnosis not present

## 2020-08-11 DIAGNOSIS — I639 Cerebral infarction, unspecified: Secondary | ICD-10-CM | POA: Diagnosis not present

## 2020-08-14 DIAGNOSIS — E46 Unspecified protein-calorie malnutrition: Secondary | ICD-10-CM | POA: Diagnosis not present

## 2020-08-14 DIAGNOSIS — I639 Cerebral infarction, unspecified: Secondary | ICD-10-CM | POA: Diagnosis not present

## 2020-08-14 DIAGNOSIS — K219 Gastro-esophageal reflux disease without esophagitis: Secondary | ICD-10-CM | POA: Diagnosis not present

## 2020-08-14 DIAGNOSIS — E559 Vitamin D deficiency, unspecified: Secondary | ICD-10-CM | POA: Diagnosis not present

## 2020-08-14 DIAGNOSIS — M1990 Unspecified osteoarthritis, unspecified site: Secondary | ICD-10-CM | POA: Diagnosis not present

## 2020-08-14 DIAGNOSIS — G8191 Hemiplegia, unspecified affecting right dominant side: Secondary | ICD-10-CM | POA: Diagnosis not present

## 2020-08-15 DIAGNOSIS — K219 Gastro-esophageal reflux disease without esophagitis: Secondary | ICD-10-CM | POA: Diagnosis not present

## 2020-08-15 DIAGNOSIS — E559 Vitamin D deficiency, unspecified: Secondary | ICD-10-CM | POA: Diagnosis not present

## 2020-08-15 DIAGNOSIS — M1990 Unspecified osteoarthritis, unspecified site: Secondary | ICD-10-CM | POA: Diagnosis not present

## 2020-08-15 DIAGNOSIS — I639 Cerebral infarction, unspecified: Secondary | ICD-10-CM | POA: Diagnosis not present

## 2020-08-15 DIAGNOSIS — G8191 Hemiplegia, unspecified affecting right dominant side: Secondary | ICD-10-CM | POA: Diagnosis not present

## 2020-08-15 DIAGNOSIS — E46 Unspecified protein-calorie malnutrition: Secondary | ICD-10-CM | POA: Diagnosis not present

## 2020-08-16 DIAGNOSIS — E559 Vitamin D deficiency, unspecified: Secondary | ICD-10-CM | POA: Diagnosis not present

## 2020-08-16 DIAGNOSIS — M1990 Unspecified osteoarthritis, unspecified site: Secondary | ICD-10-CM | POA: Diagnosis not present

## 2020-08-16 DIAGNOSIS — K219 Gastro-esophageal reflux disease without esophagitis: Secondary | ICD-10-CM | POA: Diagnosis not present

## 2020-08-16 DIAGNOSIS — I639 Cerebral infarction, unspecified: Secondary | ICD-10-CM | POA: Diagnosis not present

## 2020-08-16 DIAGNOSIS — G8191 Hemiplegia, unspecified affecting right dominant side: Secondary | ICD-10-CM | POA: Diagnosis not present

## 2020-08-16 DIAGNOSIS — E46 Unspecified protein-calorie malnutrition: Secondary | ICD-10-CM | POA: Diagnosis not present

## 2020-08-18 DIAGNOSIS — K219 Gastro-esophageal reflux disease without esophagitis: Secondary | ICD-10-CM | POA: Diagnosis not present

## 2020-08-18 DIAGNOSIS — G8191 Hemiplegia, unspecified affecting right dominant side: Secondary | ICD-10-CM | POA: Diagnosis not present

## 2020-08-18 DIAGNOSIS — I639 Cerebral infarction, unspecified: Secondary | ICD-10-CM | POA: Diagnosis not present

## 2020-08-18 DIAGNOSIS — E46 Unspecified protein-calorie malnutrition: Secondary | ICD-10-CM | POA: Diagnosis not present

## 2020-08-18 DIAGNOSIS — M1990 Unspecified osteoarthritis, unspecified site: Secondary | ICD-10-CM | POA: Diagnosis not present

## 2020-08-18 DIAGNOSIS — E559 Vitamin D deficiency, unspecified: Secondary | ICD-10-CM | POA: Diagnosis not present

## 2020-08-21 DIAGNOSIS — M1990 Unspecified osteoarthritis, unspecified site: Secondary | ICD-10-CM | POA: Diagnosis not present

## 2020-08-21 DIAGNOSIS — G8191 Hemiplegia, unspecified affecting right dominant side: Secondary | ICD-10-CM | POA: Diagnosis not present

## 2020-08-21 DIAGNOSIS — K219 Gastro-esophageal reflux disease without esophagitis: Secondary | ICD-10-CM | POA: Diagnosis not present

## 2020-08-21 DIAGNOSIS — E559 Vitamin D deficiency, unspecified: Secondary | ICD-10-CM | POA: Diagnosis not present

## 2020-08-21 DIAGNOSIS — E46 Unspecified protein-calorie malnutrition: Secondary | ICD-10-CM | POA: Diagnosis not present

## 2020-08-21 DIAGNOSIS — I639 Cerebral infarction, unspecified: Secondary | ICD-10-CM | POA: Diagnosis not present

## 2020-08-22 DIAGNOSIS — E46 Unspecified protein-calorie malnutrition: Secondary | ICD-10-CM | POA: Diagnosis not present

## 2020-08-22 DIAGNOSIS — K219 Gastro-esophageal reflux disease without esophagitis: Secondary | ICD-10-CM | POA: Diagnosis not present

## 2020-08-22 DIAGNOSIS — G8191 Hemiplegia, unspecified affecting right dominant side: Secondary | ICD-10-CM | POA: Diagnosis not present

## 2020-08-22 DIAGNOSIS — I639 Cerebral infarction, unspecified: Secondary | ICD-10-CM | POA: Diagnosis not present

## 2020-08-22 DIAGNOSIS — M1990 Unspecified osteoarthritis, unspecified site: Secondary | ICD-10-CM | POA: Diagnosis not present

## 2020-08-22 DIAGNOSIS — E559 Vitamin D deficiency, unspecified: Secondary | ICD-10-CM | POA: Diagnosis not present

## 2020-08-23 DIAGNOSIS — E559 Vitamin D deficiency, unspecified: Secondary | ICD-10-CM | POA: Diagnosis not present

## 2020-08-23 DIAGNOSIS — G8191 Hemiplegia, unspecified affecting right dominant side: Secondary | ICD-10-CM | POA: Diagnosis not present

## 2020-08-23 DIAGNOSIS — M1990 Unspecified osteoarthritis, unspecified site: Secondary | ICD-10-CM | POA: Diagnosis not present

## 2020-08-23 DIAGNOSIS — I639 Cerebral infarction, unspecified: Secondary | ICD-10-CM | POA: Diagnosis not present

## 2020-08-23 DIAGNOSIS — K219 Gastro-esophageal reflux disease without esophagitis: Secondary | ICD-10-CM | POA: Diagnosis not present

## 2020-08-23 DIAGNOSIS — E46 Unspecified protein-calorie malnutrition: Secondary | ICD-10-CM | POA: Diagnosis not present

## 2020-08-25 DIAGNOSIS — E559 Vitamin D deficiency, unspecified: Secondary | ICD-10-CM | POA: Diagnosis not present

## 2020-08-25 DIAGNOSIS — M1990 Unspecified osteoarthritis, unspecified site: Secondary | ICD-10-CM | POA: Diagnosis not present

## 2020-08-25 DIAGNOSIS — K219 Gastro-esophageal reflux disease without esophagitis: Secondary | ICD-10-CM | POA: Diagnosis not present

## 2020-08-25 DIAGNOSIS — G8191 Hemiplegia, unspecified affecting right dominant side: Secondary | ICD-10-CM | POA: Diagnosis not present

## 2020-08-25 DIAGNOSIS — I639 Cerebral infarction, unspecified: Secondary | ICD-10-CM | POA: Diagnosis not present

## 2020-08-25 DIAGNOSIS — E46 Unspecified protein-calorie malnutrition: Secondary | ICD-10-CM | POA: Diagnosis not present

## 2020-08-28 ENCOUNTER — Ambulatory Visit: Payer: Medicare Other

## 2020-08-28 DIAGNOSIS — M1990 Unspecified osteoarthritis, unspecified site: Secondary | ICD-10-CM | POA: Diagnosis not present

## 2020-08-28 DIAGNOSIS — I639 Cerebral infarction, unspecified: Secondary | ICD-10-CM | POA: Diagnosis not present

## 2020-08-28 DIAGNOSIS — G8191 Hemiplegia, unspecified affecting right dominant side: Secondary | ICD-10-CM | POA: Diagnosis not present

## 2020-08-28 DIAGNOSIS — E46 Unspecified protein-calorie malnutrition: Secondary | ICD-10-CM | POA: Diagnosis not present

## 2020-08-28 DIAGNOSIS — K219 Gastro-esophageal reflux disease without esophagitis: Secondary | ICD-10-CM | POA: Diagnosis not present

## 2020-08-28 DIAGNOSIS — E559 Vitamin D deficiency, unspecified: Secondary | ICD-10-CM | POA: Diagnosis not present

## 2020-08-29 ENCOUNTER — Ambulatory Visit: Payer: Medicare Other

## 2020-08-30 DIAGNOSIS — M1990 Unspecified osteoarthritis, unspecified site: Secondary | ICD-10-CM | POA: Diagnosis not present

## 2020-08-30 DIAGNOSIS — G8191 Hemiplegia, unspecified affecting right dominant side: Secondary | ICD-10-CM | POA: Diagnosis not present

## 2020-08-30 DIAGNOSIS — E46 Unspecified protein-calorie malnutrition: Secondary | ICD-10-CM | POA: Diagnosis not present

## 2020-08-30 DIAGNOSIS — K219 Gastro-esophageal reflux disease without esophagitis: Secondary | ICD-10-CM | POA: Diagnosis not present

## 2020-08-30 DIAGNOSIS — E559 Vitamin D deficiency, unspecified: Secondary | ICD-10-CM | POA: Diagnosis not present

## 2020-08-30 DIAGNOSIS — I639 Cerebral infarction, unspecified: Secondary | ICD-10-CM | POA: Diagnosis not present

## 2020-08-30 DIAGNOSIS — R1310 Dysphagia, unspecified: Secondary | ICD-10-CM | POA: Diagnosis not present

## 2020-08-31 DIAGNOSIS — M1990 Unspecified osteoarthritis, unspecified site: Secondary | ICD-10-CM | POA: Diagnosis not present

## 2020-08-31 DIAGNOSIS — I639 Cerebral infarction, unspecified: Secondary | ICD-10-CM | POA: Diagnosis not present

## 2020-08-31 DIAGNOSIS — E559 Vitamin D deficiency, unspecified: Secondary | ICD-10-CM | POA: Diagnosis not present

## 2020-08-31 DIAGNOSIS — E46 Unspecified protein-calorie malnutrition: Secondary | ICD-10-CM | POA: Diagnosis not present

## 2020-08-31 DIAGNOSIS — G8191 Hemiplegia, unspecified affecting right dominant side: Secondary | ICD-10-CM | POA: Diagnosis not present

## 2020-08-31 DIAGNOSIS — K219 Gastro-esophageal reflux disease without esophagitis: Secondary | ICD-10-CM | POA: Diagnosis not present

## 2020-09-01 DIAGNOSIS — I639 Cerebral infarction, unspecified: Secondary | ICD-10-CM | POA: Diagnosis not present

## 2020-09-01 DIAGNOSIS — E46 Unspecified protein-calorie malnutrition: Secondary | ICD-10-CM | POA: Diagnosis not present

## 2020-09-01 DIAGNOSIS — G8191 Hemiplegia, unspecified affecting right dominant side: Secondary | ICD-10-CM | POA: Diagnosis not present

## 2020-09-01 DIAGNOSIS — E559 Vitamin D deficiency, unspecified: Secondary | ICD-10-CM | POA: Diagnosis not present

## 2020-09-01 DIAGNOSIS — K219 Gastro-esophageal reflux disease without esophagitis: Secondary | ICD-10-CM | POA: Diagnosis not present

## 2020-09-01 DIAGNOSIS — M1990 Unspecified osteoarthritis, unspecified site: Secondary | ICD-10-CM | POA: Diagnosis not present

## 2020-09-04 DIAGNOSIS — G8191 Hemiplegia, unspecified affecting right dominant side: Secondary | ICD-10-CM | POA: Diagnosis not present

## 2020-09-04 DIAGNOSIS — K219 Gastro-esophageal reflux disease without esophagitis: Secondary | ICD-10-CM | POA: Diagnosis not present

## 2020-09-04 DIAGNOSIS — M1990 Unspecified osteoarthritis, unspecified site: Secondary | ICD-10-CM | POA: Diagnosis not present

## 2020-09-04 DIAGNOSIS — E46 Unspecified protein-calorie malnutrition: Secondary | ICD-10-CM | POA: Diagnosis not present

## 2020-09-04 DIAGNOSIS — I639 Cerebral infarction, unspecified: Secondary | ICD-10-CM | POA: Diagnosis not present

## 2020-09-04 DIAGNOSIS — E559 Vitamin D deficiency, unspecified: Secondary | ICD-10-CM | POA: Diagnosis not present

## 2020-09-05 DIAGNOSIS — E559 Vitamin D deficiency, unspecified: Secondary | ICD-10-CM | POA: Diagnosis not present

## 2020-09-05 DIAGNOSIS — K219 Gastro-esophageal reflux disease without esophagitis: Secondary | ICD-10-CM | POA: Diagnosis not present

## 2020-09-05 DIAGNOSIS — G8191 Hemiplegia, unspecified affecting right dominant side: Secondary | ICD-10-CM | POA: Diagnosis not present

## 2020-09-05 DIAGNOSIS — E46 Unspecified protein-calorie malnutrition: Secondary | ICD-10-CM | POA: Diagnosis not present

## 2020-09-05 DIAGNOSIS — M1990 Unspecified osteoarthritis, unspecified site: Secondary | ICD-10-CM | POA: Diagnosis not present

## 2020-09-05 DIAGNOSIS — I639 Cerebral infarction, unspecified: Secondary | ICD-10-CM | POA: Diagnosis not present

## 2020-09-06 DIAGNOSIS — M1990 Unspecified osteoarthritis, unspecified site: Secondary | ICD-10-CM | POA: Diagnosis not present

## 2020-09-06 DIAGNOSIS — E46 Unspecified protein-calorie malnutrition: Secondary | ICD-10-CM | POA: Diagnosis not present

## 2020-09-06 DIAGNOSIS — K219 Gastro-esophageal reflux disease without esophagitis: Secondary | ICD-10-CM | POA: Diagnosis not present

## 2020-09-06 DIAGNOSIS — E559 Vitamin D deficiency, unspecified: Secondary | ICD-10-CM | POA: Diagnosis not present

## 2020-09-06 DIAGNOSIS — G8191 Hemiplegia, unspecified affecting right dominant side: Secondary | ICD-10-CM | POA: Diagnosis not present

## 2020-09-06 DIAGNOSIS — I639 Cerebral infarction, unspecified: Secondary | ICD-10-CM | POA: Diagnosis not present

## 2020-09-08 DIAGNOSIS — M1990 Unspecified osteoarthritis, unspecified site: Secondary | ICD-10-CM | POA: Diagnosis not present

## 2020-09-08 DIAGNOSIS — E559 Vitamin D deficiency, unspecified: Secondary | ICD-10-CM | POA: Diagnosis not present

## 2020-09-08 DIAGNOSIS — K219 Gastro-esophageal reflux disease without esophagitis: Secondary | ICD-10-CM | POA: Diagnosis not present

## 2020-09-08 DIAGNOSIS — G8191 Hemiplegia, unspecified affecting right dominant side: Secondary | ICD-10-CM | POA: Diagnosis not present

## 2020-09-08 DIAGNOSIS — I639 Cerebral infarction, unspecified: Secondary | ICD-10-CM | POA: Diagnosis not present

## 2020-09-08 DIAGNOSIS — E46 Unspecified protein-calorie malnutrition: Secondary | ICD-10-CM | POA: Diagnosis not present

## 2020-09-09 DIAGNOSIS — I10 Essential (primary) hypertension: Secondary | ICD-10-CM | POA: Diagnosis not present

## 2020-09-11 DIAGNOSIS — G8191 Hemiplegia, unspecified affecting right dominant side: Secondary | ICD-10-CM | POA: Diagnosis not present

## 2020-09-11 DIAGNOSIS — I10 Essential (primary) hypertension: Secondary | ICD-10-CM | POA: Diagnosis not present

## 2020-09-11 DIAGNOSIS — M1990 Unspecified osteoarthritis, unspecified site: Secondary | ICD-10-CM | POA: Diagnosis not present

## 2020-09-11 DIAGNOSIS — E7800 Pure hypercholesterolemia, unspecified: Secondary | ICD-10-CM | POA: Diagnosis not present

## 2020-09-11 DIAGNOSIS — E46 Unspecified protein-calorie malnutrition: Secondary | ICD-10-CM | POA: Diagnosis not present

## 2020-09-11 DIAGNOSIS — I639 Cerebral infarction, unspecified: Secondary | ICD-10-CM | POA: Diagnosis not present

## 2020-09-11 DIAGNOSIS — E559 Vitamin D deficiency, unspecified: Secondary | ICD-10-CM | POA: Diagnosis not present

## 2020-09-11 DIAGNOSIS — K219 Gastro-esophageal reflux disease without esophagitis: Secondary | ICD-10-CM | POA: Diagnosis not present

## 2020-09-11 DIAGNOSIS — F0391 Unspecified dementia with behavioral disturbance: Secondary | ICD-10-CM | POA: Diagnosis not present

## 2020-09-11 DIAGNOSIS — E039 Hypothyroidism, unspecified: Secondary | ICD-10-CM | POA: Diagnosis not present

## 2020-09-11 DIAGNOSIS — H409 Unspecified glaucoma: Secondary | ICD-10-CM | POA: Diagnosis not present

## 2020-09-11 DIAGNOSIS — R1310 Dysphagia, unspecified: Secondary | ICD-10-CM | POA: Diagnosis not present

## 2020-09-11 DIAGNOSIS — R531 Weakness: Secondary | ICD-10-CM | POA: Diagnosis not present

## 2020-09-12 DIAGNOSIS — K219 Gastro-esophageal reflux disease without esophagitis: Secondary | ICD-10-CM | POA: Diagnosis not present

## 2020-09-12 DIAGNOSIS — I639 Cerebral infarction, unspecified: Secondary | ICD-10-CM | POA: Diagnosis not present

## 2020-09-12 DIAGNOSIS — E46 Unspecified protein-calorie malnutrition: Secondary | ICD-10-CM | POA: Diagnosis not present

## 2020-09-12 DIAGNOSIS — G8191 Hemiplegia, unspecified affecting right dominant side: Secondary | ICD-10-CM | POA: Diagnosis not present

## 2020-09-12 DIAGNOSIS — M1990 Unspecified osteoarthritis, unspecified site: Secondary | ICD-10-CM | POA: Diagnosis not present

## 2020-09-12 DIAGNOSIS — E559 Vitamin D deficiency, unspecified: Secondary | ICD-10-CM | POA: Diagnosis not present

## 2020-09-13 DIAGNOSIS — E559 Vitamin D deficiency, unspecified: Secondary | ICD-10-CM | POA: Diagnosis not present

## 2020-09-13 DIAGNOSIS — K219 Gastro-esophageal reflux disease without esophagitis: Secondary | ICD-10-CM | POA: Diagnosis not present

## 2020-09-13 DIAGNOSIS — E46 Unspecified protein-calorie malnutrition: Secondary | ICD-10-CM | POA: Diagnosis not present

## 2020-09-13 DIAGNOSIS — M1990 Unspecified osteoarthritis, unspecified site: Secondary | ICD-10-CM | POA: Diagnosis not present

## 2020-09-13 DIAGNOSIS — I639 Cerebral infarction, unspecified: Secondary | ICD-10-CM | POA: Diagnosis not present

## 2020-09-13 DIAGNOSIS — G8191 Hemiplegia, unspecified affecting right dominant side: Secondary | ICD-10-CM | POA: Diagnosis not present

## 2020-09-15 DIAGNOSIS — G8191 Hemiplegia, unspecified affecting right dominant side: Secondary | ICD-10-CM | POA: Diagnosis not present

## 2020-09-15 DIAGNOSIS — E46 Unspecified protein-calorie malnutrition: Secondary | ICD-10-CM | POA: Diagnosis not present

## 2020-09-15 DIAGNOSIS — M1990 Unspecified osteoarthritis, unspecified site: Secondary | ICD-10-CM | POA: Diagnosis not present

## 2020-09-15 DIAGNOSIS — E559 Vitamin D deficiency, unspecified: Secondary | ICD-10-CM | POA: Diagnosis not present

## 2020-09-15 DIAGNOSIS — K219 Gastro-esophageal reflux disease without esophagitis: Secondary | ICD-10-CM | POA: Diagnosis not present

## 2020-09-15 DIAGNOSIS — I639 Cerebral infarction, unspecified: Secondary | ICD-10-CM | POA: Diagnosis not present

## 2020-09-18 DIAGNOSIS — E559 Vitamin D deficiency, unspecified: Secondary | ICD-10-CM | POA: Diagnosis not present

## 2020-09-18 DIAGNOSIS — M1990 Unspecified osteoarthritis, unspecified site: Secondary | ICD-10-CM | POA: Diagnosis not present

## 2020-09-18 DIAGNOSIS — K219 Gastro-esophageal reflux disease without esophagitis: Secondary | ICD-10-CM | POA: Diagnosis not present

## 2020-09-18 DIAGNOSIS — G8191 Hemiplegia, unspecified affecting right dominant side: Secondary | ICD-10-CM | POA: Diagnosis not present

## 2020-09-18 DIAGNOSIS — I639 Cerebral infarction, unspecified: Secondary | ICD-10-CM | POA: Diagnosis not present

## 2020-09-18 DIAGNOSIS — E46 Unspecified protein-calorie malnutrition: Secondary | ICD-10-CM | POA: Diagnosis not present

## 2020-09-19 DIAGNOSIS — E559 Vitamin D deficiency, unspecified: Secondary | ICD-10-CM | POA: Diagnosis not present

## 2020-09-19 DIAGNOSIS — I639 Cerebral infarction, unspecified: Secondary | ICD-10-CM | POA: Diagnosis not present

## 2020-09-19 DIAGNOSIS — K219 Gastro-esophageal reflux disease without esophagitis: Secondary | ICD-10-CM | POA: Diagnosis not present

## 2020-09-19 DIAGNOSIS — G8191 Hemiplegia, unspecified affecting right dominant side: Secondary | ICD-10-CM | POA: Diagnosis not present

## 2020-09-19 DIAGNOSIS — M1990 Unspecified osteoarthritis, unspecified site: Secondary | ICD-10-CM | POA: Diagnosis not present

## 2020-09-19 DIAGNOSIS — E46 Unspecified protein-calorie malnutrition: Secondary | ICD-10-CM | POA: Diagnosis not present

## 2020-09-20 DIAGNOSIS — Z23 Encounter for immunization: Secondary | ICD-10-CM | POA: Diagnosis not present

## 2020-09-20 DIAGNOSIS — E559 Vitamin D deficiency, unspecified: Secondary | ICD-10-CM | POA: Diagnosis not present

## 2020-09-20 DIAGNOSIS — I639 Cerebral infarction, unspecified: Secondary | ICD-10-CM | POA: Diagnosis not present

## 2020-09-20 DIAGNOSIS — M1990 Unspecified osteoarthritis, unspecified site: Secondary | ICD-10-CM | POA: Diagnosis not present

## 2020-09-20 DIAGNOSIS — E46 Unspecified protein-calorie malnutrition: Secondary | ICD-10-CM | POA: Diagnosis not present

## 2020-09-20 DIAGNOSIS — G8191 Hemiplegia, unspecified affecting right dominant side: Secondary | ICD-10-CM | POA: Diagnosis not present

## 2020-09-20 DIAGNOSIS — K219 Gastro-esophageal reflux disease without esophagitis: Secondary | ICD-10-CM | POA: Diagnosis not present

## 2020-09-21 DIAGNOSIS — M79672 Pain in left foot: Secondary | ICD-10-CM | POA: Diagnosis not present

## 2020-09-21 DIAGNOSIS — L89892 Pressure ulcer of other site, stage 2: Secondary | ICD-10-CM | POA: Diagnosis not present

## 2020-09-21 DIAGNOSIS — I739 Peripheral vascular disease, unspecified: Secondary | ICD-10-CM | POA: Diagnosis not present

## 2020-09-22 DIAGNOSIS — E46 Unspecified protein-calorie malnutrition: Secondary | ICD-10-CM | POA: Diagnosis not present

## 2020-09-22 DIAGNOSIS — G8191 Hemiplegia, unspecified affecting right dominant side: Secondary | ICD-10-CM | POA: Diagnosis not present

## 2020-09-22 DIAGNOSIS — E559 Vitamin D deficiency, unspecified: Secondary | ICD-10-CM | POA: Diagnosis not present

## 2020-09-22 DIAGNOSIS — K219 Gastro-esophageal reflux disease without esophagitis: Secondary | ICD-10-CM | POA: Diagnosis not present

## 2020-09-22 DIAGNOSIS — M1990 Unspecified osteoarthritis, unspecified site: Secondary | ICD-10-CM | POA: Diagnosis not present

## 2020-09-22 DIAGNOSIS — I639 Cerebral infarction, unspecified: Secondary | ICD-10-CM | POA: Diagnosis not present

## 2020-09-25 DIAGNOSIS — M1990 Unspecified osteoarthritis, unspecified site: Secondary | ICD-10-CM | POA: Diagnosis not present

## 2020-09-25 DIAGNOSIS — E46 Unspecified protein-calorie malnutrition: Secondary | ICD-10-CM | POA: Diagnosis not present

## 2020-09-25 DIAGNOSIS — G8191 Hemiplegia, unspecified affecting right dominant side: Secondary | ICD-10-CM | POA: Diagnosis not present

## 2020-09-25 DIAGNOSIS — K219 Gastro-esophageal reflux disease without esophagitis: Secondary | ICD-10-CM | POA: Diagnosis not present

## 2020-09-25 DIAGNOSIS — I639 Cerebral infarction, unspecified: Secondary | ICD-10-CM | POA: Diagnosis not present

## 2020-09-25 DIAGNOSIS — E559 Vitamin D deficiency, unspecified: Secondary | ICD-10-CM | POA: Diagnosis not present

## 2020-09-27 DIAGNOSIS — M1990 Unspecified osteoarthritis, unspecified site: Secondary | ICD-10-CM | POA: Diagnosis not present

## 2020-09-27 DIAGNOSIS — G8191 Hemiplegia, unspecified affecting right dominant side: Secondary | ICD-10-CM | POA: Diagnosis not present

## 2020-09-27 DIAGNOSIS — E559 Vitamin D deficiency, unspecified: Secondary | ICD-10-CM | POA: Diagnosis not present

## 2020-09-27 DIAGNOSIS — K219 Gastro-esophageal reflux disease without esophagitis: Secondary | ICD-10-CM | POA: Diagnosis not present

## 2020-09-27 DIAGNOSIS — E46 Unspecified protein-calorie malnutrition: Secondary | ICD-10-CM | POA: Diagnosis not present

## 2020-09-27 DIAGNOSIS — I639 Cerebral infarction, unspecified: Secondary | ICD-10-CM | POA: Diagnosis not present

## 2020-09-29 DIAGNOSIS — E46 Unspecified protein-calorie malnutrition: Secondary | ICD-10-CM | POA: Diagnosis not present

## 2020-09-29 DIAGNOSIS — M1990 Unspecified osteoarthritis, unspecified site: Secondary | ICD-10-CM | POA: Diagnosis not present

## 2020-09-29 DIAGNOSIS — G8191 Hemiplegia, unspecified affecting right dominant side: Secondary | ICD-10-CM | POA: Diagnosis not present

## 2020-09-29 DIAGNOSIS — E559 Vitamin D deficiency, unspecified: Secondary | ICD-10-CM | POA: Diagnosis not present

## 2020-09-29 DIAGNOSIS — I639 Cerebral infarction, unspecified: Secondary | ICD-10-CM | POA: Diagnosis not present

## 2020-09-29 DIAGNOSIS — K219 Gastro-esophageal reflux disease without esophagitis: Secondary | ICD-10-CM | POA: Diagnosis not present

## 2020-10-02 DIAGNOSIS — I639 Cerebral infarction, unspecified: Secondary | ICD-10-CM | POA: Diagnosis not present

## 2020-10-02 DIAGNOSIS — K219 Gastro-esophageal reflux disease without esophagitis: Secondary | ICD-10-CM | POA: Diagnosis not present

## 2020-10-02 DIAGNOSIS — E46 Unspecified protein-calorie malnutrition: Secondary | ICD-10-CM | POA: Diagnosis not present

## 2020-10-02 DIAGNOSIS — E559 Vitamin D deficiency, unspecified: Secondary | ICD-10-CM | POA: Diagnosis not present

## 2020-10-02 DIAGNOSIS — M1990 Unspecified osteoarthritis, unspecified site: Secondary | ICD-10-CM | POA: Diagnosis not present

## 2020-10-02 DIAGNOSIS — G8191 Hemiplegia, unspecified affecting right dominant side: Secondary | ICD-10-CM | POA: Diagnosis not present

## 2020-10-03 DIAGNOSIS — G8191 Hemiplegia, unspecified affecting right dominant side: Secondary | ICD-10-CM | POA: Diagnosis not present

## 2020-10-03 DIAGNOSIS — K219 Gastro-esophageal reflux disease without esophagitis: Secondary | ICD-10-CM | POA: Diagnosis not present

## 2020-10-03 DIAGNOSIS — E559 Vitamin D deficiency, unspecified: Secondary | ICD-10-CM | POA: Diagnosis not present

## 2020-10-03 DIAGNOSIS — I639 Cerebral infarction, unspecified: Secondary | ICD-10-CM | POA: Diagnosis not present

## 2020-10-03 DIAGNOSIS — M1990 Unspecified osteoarthritis, unspecified site: Secondary | ICD-10-CM | POA: Diagnosis not present

## 2020-10-03 DIAGNOSIS — E46 Unspecified protein-calorie malnutrition: Secondary | ICD-10-CM | POA: Diagnosis not present

## 2020-10-04 DIAGNOSIS — I639 Cerebral infarction, unspecified: Secondary | ICD-10-CM | POA: Diagnosis not present

## 2020-10-04 DIAGNOSIS — E559 Vitamin D deficiency, unspecified: Secondary | ICD-10-CM | POA: Diagnosis not present

## 2020-10-04 DIAGNOSIS — K219 Gastro-esophageal reflux disease without esophagitis: Secondary | ICD-10-CM | POA: Diagnosis not present

## 2020-10-04 DIAGNOSIS — G8191 Hemiplegia, unspecified affecting right dominant side: Secondary | ICD-10-CM | POA: Diagnosis not present

## 2020-10-04 DIAGNOSIS — E46 Unspecified protein-calorie malnutrition: Secondary | ICD-10-CM | POA: Diagnosis not present

## 2020-10-04 DIAGNOSIS — M1990 Unspecified osteoarthritis, unspecified site: Secondary | ICD-10-CM | POA: Diagnosis not present

## 2020-10-06 DIAGNOSIS — M1990 Unspecified osteoarthritis, unspecified site: Secondary | ICD-10-CM | POA: Diagnosis not present

## 2020-10-06 DIAGNOSIS — E559 Vitamin D deficiency, unspecified: Secondary | ICD-10-CM | POA: Diagnosis not present

## 2020-10-06 DIAGNOSIS — I639 Cerebral infarction, unspecified: Secondary | ICD-10-CM | POA: Diagnosis not present

## 2020-10-06 DIAGNOSIS — G8191 Hemiplegia, unspecified affecting right dominant side: Secondary | ICD-10-CM | POA: Diagnosis not present

## 2020-10-06 DIAGNOSIS — E46 Unspecified protein-calorie malnutrition: Secondary | ICD-10-CM | POA: Diagnosis not present

## 2020-10-06 DIAGNOSIS — K219 Gastro-esophageal reflux disease without esophagitis: Secondary | ICD-10-CM | POA: Diagnosis not present

## 2020-10-09 DIAGNOSIS — K219 Gastro-esophageal reflux disease without esophagitis: Secondary | ICD-10-CM | POA: Diagnosis not present

## 2020-10-09 DIAGNOSIS — G8191 Hemiplegia, unspecified affecting right dominant side: Secondary | ICD-10-CM | POA: Diagnosis not present

## 2020-10-09 DIAGNOSIS — E559 Vitamin D deficiency, unspecified: Secondary | ICD-10-CM | POA: Diagnosis not present

## 2020-10-09 DIAGNOSIS — E46 Unspecified protein-calorie malnutrition: Secondary | ICD-10-CM | POA: Diagnosis not present

## 2020-10-09 DIAGNOSIS — I639 Cerebral infarction, unspecified: Secondary | ICD-10-CM | POA: Diagnosis not present

## 2020-10-09 DIAGNOSIS — M1990 Unspecified osteoarthritis, unspecified site: Secondary | ICD-10-CM | POA: Diagnosis not present

## 2020-10-10 DIAGNOSIS — E46 Unspecified protein-calorie malnutrition: Secondary | ICD-10-CM | POA: Diagnosis not present

## 2020-10-10 DIAGNOSIS — M1990 Unspecified osteoarthritis, unspecified site: Secondary | ICD-10-CM | POA: Diagnosis not present

## 2020-10-10 DIAGNOSIS — E559 Vitamin D deficiency, unspecified: Secondary | ICD-10-CM | POA: Diagnosis not present

## 2020-10-10 DIAGNOSIS — K219 Gastro-esophageal reflux disease without esophagitis: Secondary | ICD-10-CM | POA: Diagnosis not present

## 2020-10-10 DIAGNOSIS — G8191 Hemiplegia, unspecified affecting right dominant side: Secondary | ICD-10-CM | POA: Diagnosis not present

## 2020-10-10 DIAGNOSIS — I639 Cerebral infarction, unspecified: Secondary | ICD-10-CM | POA: Diagnosis not present

## 2020-10-10 DIAGNOSIS — I10 Essential (primary) hypertension: Secondary | ICD-10-CM | POA: Diagnosis not present

## 2020-10-11 DIAGNOSIS — E559 Vitamin D deficiency, unspecified: Secondary | ICD-10-CM | POA: Diagnosis not present

## 2020-10-11 DIAGNOSIS — H409 Unspecified glaucoma: Secondary | ICD-10-CM | POA: Diagnosis not present

## 2020-10-11 DIAGNOSIS — G8191 Hemiplegia, unspecified affecting right dominant side: Secondary | ICD-10-CM | POA: Diagnosis not present

## 2020-10-11 DIAGNOSIS — E46 Unspecified protein-calorie malnutrition: Secondary | ICD-10-CM | POA: Diagnosis not present

## 2020-10-11 DIAGNOSIS — I639 Cerebral infarction, unspecified: Secondary | ICD-10-CM | POA: Diagnosis not present

## 2020-10-11 DIAGNOSIS — E7800 Pure hypercholesterolemia, unspecified: Secondary | ICD-10-CM | POA: Diagnosis not present

## 2020-10-11 DIAGNOSIS — R531 Weakness: Secondary | ICD-10-CM | POA: Diagnosis not present

## 2020-10-11 DIAGNOSIS — E039 Hypothyroidism, unspecified: Secondary | ICD-10-CM | POA: Diagnosis not present

## 2020-10-11 DIAGNOSIS — R1310 Dysphagia, unspecified: Secondary | ICD-10-CM | POA: Diagnosis not present

## 2020-10-11 DIAGNOSIS — F0391 Unspecified dementia with behavioral disturbance: Secondary | ICD-10-CM | POA: Diagnosis not present

## 2020-10-11 DIAGNOSIS — I10 Essential (primary) hypertension: Secondary | ICD-10-CM | POA: Diagnosis not present

## 2020-10-11 DIAGNOSIS — M1990 Unspecified osteoarthritis, unspecified site: Secondary | ICD-10-CM | POA: Diagnosis not present

## 2020-10-11 DIAGNOSIS — K219 Gastro-esophageal reflux disease without esophagitis: Secondary | ICD-10-CM | POA: Diagnosis not present

## 2020-10-13 DIAGNOSIS — E559 Vitamin D deficiency, unspecified: Secondary | ICD-10-CM | POA: Diagnosis not present

## 2020-10-13 DIAGNOSIS — I639 Cerebral infarction, unspecified: Secondary | ICD-10-CM | POA: Diagnosis not present

## 2020-10-13 DIAGNOSIS — M1990 Unspecified osteoarthritis, unspecified site: Secondary | ICD-10-CM | POA: Diagnosis not present

## 2020-10-13 DIAGNOSIS — K219 Gastro-esophageal reflux disease without esophagitis: Secondary | ICD-10-CM | POA: Diagnosis not present

## 2020-10-13 DIAGNOSIS — G8191 Hemiplegia, unspecified affecting right dominant side: Secondary | ICD-10-CM | POA: Diagnosis not present

## 2020-10-13 DIAGNOSIS — E46 Unspecified protein-calorie malnutrition: Secondary | ICD-10-CM | POA: Diagnosis not present

## 2020-10-16 DIAGNOSIS — I639 Cerebral infarction, unspecified: Secondary | ICD-10-CM | POA: Diagnosis not present

## 2020-10-16 DIAGNOSIS — E46 Unspecified protein-calorie malnutrition: Secondary | ICD-10-CM | POA: Diagnosis not present

## 2020-10-16 DIAGNOSIS — E559 Vitamin D deficiency, unspecified: Secondary | ICD-10-CM | POA: Diagnosis not present

## 2020-10-16 DIAGNOSIS — G8191 Hemiplegia, unspecified affecting right dominant side: Secondary | ICD-10-CM | POA: Diagnosis not present

## 2020-10-16 DIAGNOSIS — M1990 Unspecified osteoarthritis, unspecified site: Secondary | ICD-10-CM | POA: Diagnosis not present

## 2020-10-16 DIAGNOSIS — K219 Gastro-esophageal reflux disease without esophagitis: Secondary | ICD-10-CM | POA: Diagnosis not present

## 2020-10-17 DIAGNOSIS — E46 Unspecified protein-calorie malnutrition: Secondary | ICD-10-CM | POA: Diagnosis not present

## 2020-10-17 DIAGNOSIS — I639 Cerebral infarction, unspecified: Secondary | ICD-10-CM | POA: Diagnosis not present

## 2020-10-17 DIAGNOSIS — G8191 Hemiplegia, unspecified affecting right dominant side: Secondary | ICD-10-CM | POA: Diagnosis not present

## 2020-10-17 DIAGNOSIS — E559 Vitamin D deficiency, unspecified: Secondary | ICD-10-CM | POA: Diagnosis not present

## 2020-10-17 DIAGNOSIS — K219 Gastro-esophageal reflux disease without esophagitis: Secondary | ICD-10-CM | POA: Diagnosis not present

## 2020-10-17 DIAGNOSIS — M1990 Unspecified osteoarthritis, unspecified site: Secondary | ICD-10-CM | POA: Diagnosis not present

## 2020-10-18 DIAGNOSIS — G8191 Hemiplegia, unspecified affecting right dominant side: Secondary | ICD-10-CM | POA: Diagnosis not present

## 2020-10-18 DIAGNOSIS — K219 Gastro-esophageal reflux disease without esophagitis: Secondary | ICD-10-CM | POA: Diagnosis not present

## 2020-10-18 DIAGNOSIS — E559 Vitamin D deficiency, unspecified: Secondary | ICD-10-CM | POA: Diagnosis not present

## 2020-10-18 DIAGNOSIS — M1990 Unspecified osteoarthritis, unspecified site: Secondary | ICD-10-CM | POA: Diagnosis not present

## 2020-10-18 DIAGNOSIS — I639 Cerebral infarction, unspecified: Secondary | ICD-10-CM | POA: Diagnosis not present

## 2020-10-18 DIAGNOSIS — E46 Unspecified protein-calorie malnutrition: Secondary | ICD-10-CM | POA: Diagnosis not present

## 2020-10-20 DIAGNOSIS — G8191 Hemiplegia, unspecified affecting right dominant side: Secondary | ICD-10-CM | POA: Diagnosis not present

## 2020-10-20 DIAGNOSIS — E46 Unspecified protein-calorie malnutrition: Secondary | ICD-10-CM | POA: Diagnosis not present

## 2020-10-20 DIAGNOSIS — F0391 Unspecified dementia with behavioral disturbance: Secondary | ICD-10-CM | POA: Diagnosis not present

## 2020-10-20 DIAGNOSIS — Z299 Encounter for prophylactic measures, unspecified: Secondary | ICD-10-CM | POA: Diagnosis not present

## 2020-10-20 DIAGNOSIS — R350 Frequency of micturition: Secondary | ICD-10-CM | POA: Diagnosis not present

## 2020-10-20 DIAGNOSIS — E559 Vitamin D deficiency, unspecified: Secondary | ICD-10-CM | POA: Diagnosis not present

## 2020-10-20 DIAGNOSIS — N19 Unspecified kidney failure: Secondary | ICD-10-CM | POA: Diagnosis not present

## 2020-10-20 DIAGNOSIS — M1990 Unspecified osteoarthritis, unspecified site: Secondary | ICD-10-CM | POA: Diagnosis not present

## 2020-10-20 DIAGNOSIS — I639 Cerebral infarction, unspecified: Secondary | ICD-10-CM | POA: Diagnosis not present

## 2020-10-20 DIAGNOSIS — K219 Gastro-esophageal reflux disease without esophagitis: Secondary | ICD-10-CM | POA: Diagnosis not present

## 2020-10-20 DIAGNOSIS — N390 Urinary tract infection, site not specified: Secondary | ICD-10-CM | POA: Diagnosis not present

## 2020-10-23 DIAGNOSIS — E559 Vitamin D deficiency, unspecified: Secondary | ICD-10-CM | POA: Diagnosis not present

## 2020-10-23 DIAGNOSIS — K219 Gastro-esophageal reflux disease without esophagitis: Secondary | ICD-10-CM | POA: Diagnosis not present

## 2020-10-23 DIAGNOSIS — G8191 Hemiplegia, unspecified affecting right dominant side: Secondary | ICD-10-CM | POA: Diagnosis not present

## 2020-10-23 DIAGNOSIS — I639 Cerebral infarction, unspecified: Secondary | ICD-10-CM | POA: Diagnosis not present

## 2020-10-23 DIAGNOSIS — M1990 Unspecified osteoarthritis, unspecified site: Secondary | ICD-10-CM | POA: Diagnosis not present

## 2020-10-23 DIAGNOSIS — E46 Unspecified protein-calorie malnutrition: Secondary | ICD-10-CM | POA: Diagnosis not present

## 2020-10-24 DIAGNOSIS — I82409 Acute embolism and thrombosis of unspecified deep veins of unspecified lower extremity: Secondary | ICD-10-CM | POA: Diagnosis not present

## 2020-10-24 DIAGNOSIS — G8191 Hemiplegia, unspecified affecting right dominant side: Secondary | ICD-10-CM | POA: Diagnosis not present

## 2020-10-24 DIAGNOSIS — L97919 Non-pressure chronic ulcer of unspecified part of right lower leg with unspecified severity: Secondary | ICD-10-CM | POA: Diagnosis not present

## 2020-10-24 DIAGNOSIS — Z299 Encounter for prophylactic measures, unspecified: Secondary | ICD-10-CM | POA: Diagnosis not present

## 2020-10-24 DIAGNOSIS — F322 Major depressive disorder, single episode, severe without psychotic features: Secondary | ICD-10-CM | POA: Diagnosis not present

## 2020-10-25 DIAGNOSIS — I639 Cerebral infarction, unspecified: Secondary | ICD-10-CM | POA: Diagnosis not present

## 2020-10-25 DIAGNOSIS — E559 Vitamin D deficiency, unspecified: Secondary | ICD-10-CM | POA: Diagnosis not present

## 2020-10-25 DIAGNOSIS — G8191 Hemiplegia, unspecified affecting right dominant side: Secondary | ICD-10-CM | POA: Diagnosis not present

## 2020-10-25 DIAGNOSIS — K219 Gastro-esophageal reflux disease without esophagitis: Secondary | ICD-10-CM | POA: Diagnosis not present

## 2020-10-25 DIAGNOSIS — M1990 Unspecified osteoarthritis, unspecified site: Secondary | ICD-10-CM | POA: Diagnosis not present

## 2020-10-25 DIAGNOSIS — E46 Unspecified protein-calorie malnutrition: Secondary | ICD-10-CM | POA: Diagnosis not present

## 2020-10-26 DIAGNOSIS — M1990 Unspecified osteoarthritis, unspecified site: Secondary | ICD-10-CM | POA: Diagnosis not present

## 2020-10-26 DIAGNOSIS — K219 Gastro-esophageal reflux disease without esophagitis: Secondary | ICD-10-CM | POA: Diagnosis not present

## 2020-10-26 DIAGNOSIS — E559 Vitamin D deficiency, unspecified: Secondary | ICD-10-CM | POA: Diagnosis not present

## 2020-10-26 DIAGNOSIS — I639 Cerebral infarction, unspecified: Secondary | ICD-10-CM | POA: Diagnosis not present

## 2020-10-26 DIAGNOSIS — E46 Unspecified protein-calorie malnutrition: Secondary | ICD-10-CM | POA: Diagnosis not present

## 2020-10-26 DIAGNOSIS — G8191 Hemiplegia, unspecified affecting right dominant side: Secondary | ICD-10-CM | POA: Diagnosis not present

## 2020-10-27 DIAGNOSIS — E559 Vitamin D deficiency, unspecified: Secondary | ICD-10-CM | POA: Diagnosis not present

## 2020-10-27 DIAGNOSIS — M1990 Unspecified osteoarthritis, unspecified site: Secondary | ICD-10-CM | POA: Diagnosis not present

## 2020-10-27 DIAGNOSIS — E46 Unspecified protein-calorie malnutrition: Secondary | ICD-10-CM | POA: Diagnosis not present

## 2020-10-27 DIAGNOSIS — I639 Cerebral infarction, unspecified: Secondary | ICD-10-CM | POA: Diagnosis not present

## 2020-10-27 DIAGNOSIS — G8191 Hemiplegia, unspecified affecting right dominant side: Secondary | ICD-10-CM | POA: Diagnosis not present

## 2020-10-27 DIAGNOSIS — K219 Gastro-esophageal reflux disease without esophagitis: Secondary | ICD-10-CM | POA: Diagnosis not present

## 2020-11-09 DIAGNOSIS — I10 Essential (primary) hypertension: Secondary | ICD-10-CM | POA: Diagnosis not present

## 2020-11-11 DEATH — deceased
